# Patient Record
Sex: Female | Born: 1935 | Race: White | Hispanic: No | Marital: Married | Smoking: Never smoker
Health system: Southern US, Community
[De-identification: ages and names within clinical notes are randomized; demographics above are authoritative.]

## PROBLEM LIST (undated history)

## (undated) DIAGNOSIS — I10 Essential (primary) hypertension: Secondary | ICD-10-CM

## (undated) DIAGNOSIS — F419 Anxiety disorder, unspecified: Secondary | ICD-10-CM

## (undated) DIAGNOSIS — N390 Urinary tract infection, site not specified: Secondary | ICD-10-CM

## (undated) DIAGNOSIS — K219 Gastro-esophageal reflux disease without esophagitis: Secondary | ICD-10-CM

## (undated) DIAGNOSIS — R296 Repeated falls: Secondary | ICD-10-CM

## (undated) DIAGNOSIS — F0390 Unspecified dementia without behavioral disturbance: Secondary | ICD-10-CM

## (undated) DIAGNOSIS — M1990 Unspecified osteoarthritis, unspecified site: Secondary | ICD-10-CM

## (undated) DIAGNOSIS — E876 Hypokalemia: Secondary | ICD-10-CM

## (undated) DIAGNOSIS — I639 Cerebral infarction, unspecified: Secondary | ICD-10-CM

## (undated) DIAGNOSIS — M810 Age-related osteoporosis without current pathological fracture: Secondary | ICD-10-CM

## (undated) HISTORY — PX: EYE SURGERY: SHX253

## (undated) HISTORY — PX: JOINT REPLACEMENT: SHX530

---

## 1998-01-14 ENCOUNTER — Encounter: Admission: RE | Admit: 1998-01-14 | Discharge: 1998-04-14 | Payer: Self-pay | Admitting: Internal Medicine

## 1999-02-02 ENCOUNTER — Ambulatory Visit (HOSPITAL_COMMUNITY): Admission: RE | Admit: 1999-02-02 | Discharge: 1999-02-02 | Payer: Self-pay | Admitting: Cardiology

## 1999-02-02 ENCOUNTER — Encounter: Payer: Self-pay | Admitting: Cardiology

## 1999-02-03 ENCOUNTER — Encounter: Payer: Self-pay | Admitting: Orthopedic Surgery

## 1999-02-09 ENCOUNTER — Encounter: Payer: Self-pay | Admitting: Orthopedic Surgery

## 1999-02-09 ENCOUNTER — Inpatient Hospital Stay (HOSPITAL_COMMUNITY): Admission: RE | Admit: 1999-02-09 | Discharge: 1999-02-16 | Payer: Self-pay | Admitting: Orthopedic Surgery

## 1999-02-16 ENCOUNTER — Inpatient Hospital Stay (HOSPITAL_COMMUNITY)
Admission: RE | Admit: 1999-02-16 | Discharge: 1999-02-24 | Payer: Self-pay | Admitting: Physical Medicine and Rehabilitation

## 1999-02-23 ENCOUNTER — Encounter: Payer: Self-pay | Admitting: Physical Medicine and Rehabilitation

## 1999-02-24 ENCOUNTER — Inpatient Hospital Stay (HOSPITAL_COMMUNITY): Admission: AD | Admit: 1999-02-24 | Discharge: 1999-03-01 | Payer: Self-pay | Admitting: Orthopedic Surgery

## 1999-02-24 ENCOUNTER — Encounter: Payer: Self-pay | Admitting: Orthopedic Surgery

## 1999-03-01 ENCOUNTER — Inpatient Hospital Stay (HOSPITAL_COMMUNITY)
Admission: RE | Admit: 1999-03-01 | Discharge: 1999-03-09 | Payer: Self-pay | Admitting: Physical Medicine & Rehabilitation

## 1999-07-28 ENCOUNTER — Encounter: Payer: Self-pay | Admitting: Orthopedic Surgery

## 1999-07-28 ENCOUNTER — Inpatient Hospital Stay (HOSPITAL_COMMUNITY): Admission: EM | Admit: 1999-07-28 | Discharge: 1999-07-29 | Payer: Self-pay | Admitting: Emergency Medicine

## 1999-08-02 ENCOUNTER — Encounter: Payer: Self-pay | Admitting: Orthopedic Surgery

## 1999-08-04 ENCOUNTER — Inpatient Hospital Stay (HOSPITAL_COMMUNITY): Admission: RE | Admit: 1999-08-04 | Discharge: 1999-08-11 | Payer: Self-pay | Admitting: Orthopedic Surgery

## 1999-08-04 ENCOUNTER — Encounter: Payer: Self-pay | Admitting: Orthopedic Surgery

## 1999-12-15 ENCOUNTER — Other Ambulatory Visit: Admission: RE | Admit: 1999-12-15 | Discharge: 1999-12-15 | Payer: Self-pay | Admitting: Internal Medicine

## 2002-03-16 ENCOUNTER — Emergency Department (HOSPITAL_COMMUNITY): Admission: EM | Admit: 2002-03-16 | Discharge: 2002-03-16 | Payer: Self-pay | Admitting: Emergency Medicine

## 2002-03-16 ENCOUNTER — Encounter: Payer: Self-pay | Admitting: Internal Medicine

## 2002-04-08 ENCOUNTER — Ambulatory Visit (HOSPITAL_COMMUNITY): Admission: RE | Admit: 2002-04-08 | Discharge: 2002-04-08 | Payer: Self-pay | Admitting: Neurology

## 2002-04-08 ENCOUNTER — Encounter: Payer: Self-pay | Admitting: Neurology

## 2002-10-21 ENCOUNTER — Ambulatory Visit (HOSPITAL_COMMUNITY): Admission: RE | Admit: 2002-10-21 | Discharge: 2002-10-21 | Payer: Self-pay | Admitting: *Deleted

## 2002-11-08 ENCOUNTER — Encounter: Payer: Self-pay | Admitting: Neurology

## 2002-11-08 ENCOUNTER — Ambulatory Visit (HOSPITAL_COMMUNITY): Admission: RE | Admit: 2002-11-08 | Discharge: 2002-11-08 | Payer: Self-pay | Admitting: Neurology

## 2003-03-09 ENCOUNTER — Encounter: Payer: Self-pay | Admitting: Internal Medicine

## 2003-03-09 ENCOUNTER — Encounter: Admission: RE | Admit: 2003-03-09 | Discharge: 2003-03-09 | Payer: Self-pay | Admitting: Internal Medicine

## 2003-11-05 ENCOUNTER — Ambulatory Visit (HOSPITAL_COMMUNITY): Admission: RE | Admit: 2003-11-05 | Discharge: 2003-11-05 | Payer: Self-pay | Admitting: Neurology

## 2004-09-23 ENCOUNTER — Emergency Department (HOSPITAL_COMMUNITY): Admission: EM | Admit: 2004-09-23 | Discharge: 2004-09-23 | Payer: Self-pay | Admitting: Emergency Medicine

## 2006-05-19 ENCOUNTER — Encounter: Admission: RE | Admit: 2006-05-19 | Discharge: 2006-05-19 | Payer: Self-pay | Admitting: Psychiatry

## 2006-07-15 ENCOUNTER — Inpatient Hospital Stay (HOSPITAL_COMMUNITY): Admission: RE | Admit: 2006-07-15 | Discharge: 2006-07-19 | Payer: Self-pay | Admitting: Orthopedic Surgery

## 2006-08-12 ENCOUNTER — Observation Stay (HOSPITAL_COMMUNITY): Admission: EM | Admit: 2006-08-12 | Discharge: 2006-08-13 | Payer: Self-pay | Admitting: Emergency Medicine

## 2006-09-21 ENCOUNTER — Inpatient Hospital Stay (HOSPITAL_COMMUNITY): Admission: EM | Admit: 2006-09-21 | Discharge: 2006-09-22 | Payer: Self-pay | Admitting: Emergency Medicine

## 2006-11-13 ENCOUNTER — Emergency Department (HOSPITAL_COMMUNITY): Admission: EM | Admit: 2006-11-13 | Discharge: 2006-11-13 | Payer: Self-pay | Admitting: Emergency Medicine

## 2006-11-15 ENCOUNTER — Inpatient Hospital Stay (HOSPITAL_COMMUNITY): Admission: RE | Admit: 2006-11-15 | Discharge: 2006-11-19 | Payer: Self-pay | Admitting: Orthopedic Surgery

## 2007-02-15 ENCOUNTER — Other Ambulatory Visit: Admission: RE | Admit: 2007-02-15 | Discharge: 2007-02-15 | Payer: Self-pay | Admitting: Geriatric Medicine

## 2007-12-19 ENCOUNTER — Encounter: Admission: RE | Admit: 2007-12-19 | Discharge: 2007-12-19 | Payer: Self-pay | Admitting: Geriatric Medicine

## 2009-10-02 ENCOUNTER — Emergency Department (HOSPITAL_COMMUNITY): Admission: EM | Admit: 2009-10-02 | Discharge: 2009-10-02 | Payer: Self-pay | Admitting: Emergency Medicine

## 2010-05-04 ENCOUNTER — Inpatient Hospital Stay (HOSPITAL_COMMUNITY): Admission: EM | Admit: 2010-05-04 | Discharge: 2010-05-07 | Payer: Self-pay | Admitting: Emergency Medicine

## 2010-09-14 ENCOUNTER — Encounter: Admission: RE | Admit: 2010-09-14 | Discharge: 2010-09-14 | Payer: Self-pay | Admitting: Geriatric Medicine

## 2010-12-25 LAB — COMPREHENSIVE METABOLIC PANEL
Albumin: 2.6 g/dL — ABNORMAL LOW (ref 3.5–5.2)
BUN: 4 mg/dL — ABNORMAL LOW (ref 6–23)
Calcium: 8.3 mg/dL — ABNORMAL LOW (ref 8.4–10.5)
Creatinine, Ser: 0.46 mg/dL (ref 0.4–1.2)
Total Protein: 5.5 g/dL — ABNORMAL LOW (ref 6.0–8.3)

## 2010-12-25 LAB — TSH: TSH: 3.457 u[IU]/mL (ref 0.350–4.500)

## 2010-12-25 LAB — POCT I-STAT, CHEM 8
Creatinine, Ser: 0.8 mg/dL (ref 0.4–1.2)
Glucose, Bld: 89 mg/dL (ref 70–99)
Hemoglobin: 12.9 g/dL (ref 12.0–15.0)
Potassium: 2.7 mEq/L — CL (ref 3.5–5.1)

## 2010-12-25 LAB — URINALYSIS, ROUTINE W REFLEX MICROSCOPIC
Bilirubin Urine: NEGATIVE
Nitrite: POSITIVE — AB
Specific Gravity, Urine: 1.007 (ref 1.005–1.030)
Urobilinogen, UA: 0.2 mg/dL (ref 0.0–1.0)

## 2010-12-25 LAB — DIFFERENTIAL
Basophils Absolute: 0 10*3/uL (ref 0.0–0.1)
Lymphocytes Relative: 27 % (ref 12–46)
Monocytes Absolute: 0.6 10*3/uL (ref 0.1–1.0)
Monocytes Relative: 8 % (ref 3–12)
Neutro Abs: 4.4 10*3/uL (ref 1.7–7.7)
Neutrophils Relative %: 64 % (ref 43–77)

## 2010-12-25 LAB — PROTIME-INR
INR: 1.08 (ref 0.00–1.49)
Prothrombin Time: 13.9 seconds (ref 11.6–15.2)

## 2010-12-25 LAB — CBC
MCH: 32.8 pg (ref 26.0–34.0)
MCHC: 34.8 g/dL (ref 30.0–36.0)
MCV: 94 fL (ref 78.0–100.0)
Platelets: 244 10*3/uL (ref 150–400)
RDW: 13.4 % (ref 11.5–15.5)
WBC: 6.9 10*3/uL (ref 4.0–10.5)

## 2010-12-25 LAB — URINE CULTURE
Colony Count: >=100000
Culture  Setup Time: 201107270132

## 2010-12-25 LAB — PHOSPHORUS: Phosphorus: 2.6 mg/dL (ref 2.3–4.6)

## 2010-12-25 LAB — URINE MICROSCOPIC-ADD ON

## 2011-02-25 NOTE — H&P (Signed)
Loretta Garcia, Loretta Garcia                ACCOUNT NO.:  [id number]   MEDICAL RECORD NO.:  [id number]          PATIENT TYPE:  INP   LOCATION:  1421                         FACILITY:  [facility]   PHYSICIAN:  Ollen Gross, M.D.    DATE OF BIRTH:  07-Jun-1920   DATE OF ADMISSION:  07/15/2006  DATE OF DISCHARGE:                                HISTORY & PHYSICAL   DATE OF OFFICE VISIT AND HISTORY & PHYSICAL:  June 29, 2006.   CHIEF COMPLAINT:  Left hip pain.   HISTORY OF PRESENT ILLNESS:  This is an 77 year old female seen by Dr.  Lequita Halt for ongoing hip pain.  She has previously had a right hip revision  about 18 years ago.  The right hip has doing great.  She continues to have  pain on the left side.  She was found to have end-stage arthritis with bone-  on-bone throughout with some osteophyte formation.  It is felt she has  reached the point where she would benefit from undergoing surgery.  Risks  and benefits discussed.  Patient is scheduled to come to the hospital.   ALLERGIES:  CODEINE and DARVOCET which caused tongue swelling.   CURRENT MEDICATIONS:  Plavix, aspirin, Lozol, Micardis.   PAST MEDICAL HISTORY:  1. Stroke.  2. Hypertension.  3. Reflux disease.  4. History of thoracic compression fracture.   PAST SURGICAL HISTORY:  1. Hip surgeries.  2. Bunion surgery.  3. Cyst, right leg, surgery.  4. Detached retinal surgery.  5. Cataract surgery.   SOCIAL HISTORY:  Married, retired.  No smoking, no alcohol.  Two daughters.   FAMILY HISTORY:  Significant for hypertension, stroke, and heart disease.   REVIEW OF SYSTEMS:  GENERAL: No fever, chills, night sweats.  NEUROLOGIC:  History of stroke around 1999.  No seizure, syncope, paralysis.  RESPIRATORY: No shortness of breath, productive cough, or hemoptysis.  CARDIOVASCULAR: No chest pain, orthopnea.  GI: No nausea, vomiting,  diarrhea, constipation. GU: No dysuria, hematuria, discharge.  MUSCULOSKELETAL: Left hip.   PHYSICAL  EXAMINATION:  VITAL SIGNS:  Pulse 64, respiratory rate 12, blood  pressure 118/68.  GENERAL: An 77 year old female, well-nourished, well-developed, in no acute  distress, mildly anxious, average historian.  Alert, oriented, cooperative,  pleasant.  HEENT:  Normocephalic and atraumatic.  Pupils equal, round, and reactive.  Oropharynx clear.  EOMs intact.  NECK:  Supple.  CHEST:  Clear anterior and posterior chest wall.  HEART:  Regular rate and rhythm.  S1, S2 noted.  ABDOMEN:  Soft, nontender.  Bowel sounds present.  RECTAL/BREASTS/GENITALIA:  Not done, not pertinent to present illness.  EXTREMITIES:  Left hip flexion to 90 degrees.  Internal rotation 0, external  rotation 15, 20 degrees abduction.   IMPRESSION:  1. Osteoarthritis, left hip.  2. History of stroke.  3. Hypertension.  4. Reflux disease.  5. History of thoracic compression fracture.   PLAN:  The patient will be admitted to [facility] to undergo a  left total hip arthroplasty.  Surgery will be performed by Dr. Ollen Gross.      Loretta L.  Julien Garcia, P.A.      Ollen Gross, M.D.  Electronically Signed    ALP/MEDQ  D:  07/16/2006  T:  07/17/2006  Job:  [id number]   cc:   Soyla Murphy. Renne Crigler, M.D.  Fax: [phone]

## 2011-02-25 NOTE — Op Note (Signed)
Loretta Garcia, Loretta Garcia                ACCOUNT NO.:  [id number]   MEDICAL RECORD NO.:  [id number]          PATIENT TYPE:  INP   LOCATION:  1610                         [facility]ACILITY:  [facility]   PHYSICIAN:  Ollen Gross, M.D.    DATE OF BIRTH:  1920/07/06   DATE OF PROCEDURE:  11/16/2006  DATE OF DISCHARGE:                               OPERATIVE REPORT   PREOPERATIVE DIAGNOSIS:  Unstable left total hip arthroplasty.   POSTOPERATIVE DIAGNOSIS:  Unstable left total hip arthroplasty.   PROCEDURE:  Left hip revision to acetabular constrained liner.   SURGEON:  Dr. Lequita Halt   ASSISTANT:  Avel Peace, PA-C   ANESTHESIA:  General.   ESTIMATED BLOOD LOSS:  Less than 100.   DRAINS:  Hemovac x1.   COMPLICATIONS:  None.   CONDITION:  Stable to recovery.   BRIEF CLINICAL NOTE:  Loretta Garcia is an 77 year old female, had a left  total hip arthroplasty performed early October 2007.  Did very well  initially and then sustained an anterior dislocation.  She had 1 other  dislocation episode and has been in an abduction brace.  Unfortunately,  she has had episodes where she felt like the hip was subluxing.  She is  now at the point where she is extremely afraid of the hip.  We discussed  options and have decided upon revision surgery.   PROCEDURE IN DETAIL:  After the successful administration of general  anesthetic, the patient was placed in the right lateral decubitus  position with the left side up and held with the hip positioner.  Left  lower extremity is isolated from her perineum with plastic drapes and  prepped and draped in the usual sterile fashion.  Previous short  posterolateral incisions were utilized.  Skin cut with a 10 blade  through subcutaneous tissue to the level of the fascia lata which was  incised in line with the skin incision.  Sciatic nerve is palpated and  protected.  The posterior capsular structures were essentially stripped  from the femur already.  There was a small  amount of joint fluid  present, and that is removed.  No signs of any infection.  I placed  through a range of motion.  With full extension, full flexion rotation,  she was not dislocating anteriorly.  She did dislocate posteriorly when  I brought her up to flexion of 90, adduction of 30, and internal  rotation of about 70.  I dislocated and then removed the femoral head  which was a 32.0.  The hip is then translocated anteriorly to gain  acetabular exposure.  The acetabular component is in excellent position.  It matches her native anatomy.  I then examined femoral component, and  it also matched her native anatomy; thus we are not dealing with a  version problem on either component.  However, she did have quite lax  soft tissues.  We decided that the best means for fixing this problem  would be a constrained acetabular liner.  We then removed the liner from  the acetabular shell using the extraction device.  I then placed  the 52  mm constrained acetabular liner and impacted it in position.  We  replaced the 32.0 femoral head and placed the locking ring around the  neck.  The hip is reduced and than a locking ring placed around the  liner.  This is a very stable reduction.  She was not impinging  throughout any range of motion.  The wound is then copiously irrigated  with saline solution and the posterior pseudocapsule reattached to the  femur through drill holes.  Fascia lata was closed over Hemovac drain  with interrupted #1 Vicryl, subcu closed #1 and #2-0 Vicryl, and  subcuticular running 4-0 Monocryl.  The drain is hooked to suction,  incision cleaned and dried, and Steri-Strips and a bulky sterile  dressing applied.  She is then awakened and transported to recovery in  stable condition.      Ollen Gross, M.D.  Electronically Signed     FA/MEDQ  D:  11/16/2006  T:  11/16/2006  Job:  [id number]

## 2011-02-25 NOTE — Discharge Summary (Signed)
[name], KNUDSEN                ACCOUNT NO.:  [id number]   MEDICAL RECORD NO.:  [id number]          PATIENT TYPE:  INP   LOCATION:  1610                         FACILITY:  [facility]   PHYSICIAN:  Ollen Gross, M.D.    DATE OF BIRTH:  Nov 22, 1919   DATE OF ADMISSION:  11/15/2006  DATE OF DISCHARGE:  11/19/2006                               DISCHARGE SUMMARY   ADMISSION DIAGNOSES:  1. Recurrent dislocations, left total hip.  2. History of cerebrovascular accident.  3. Hypertension.  4. Reflux disease.  5. History of thoracic compression fracture.   DISCHARGE DIAGNOSES:  1. Unstable left total hip arthroplasty, status post left hip revision      to acetabular constrained liner.  2. Recurrent dislocations, left total hip.  3. History of cerebrovascular accident.  4. Hypertension.  5. Reflux disease.  6. History of thoracic compression fracture.  7. Postoperative hypokalemia.   PROCEDURE:  On November 16, 2006, left hip revision to acetabular  constrained liner.   SURGEON:  Ollen Gross, M.D.   ASSISTANT:  Alexzandrew L. Perkins, P.A.-C.   ANESTHESIA:  General.   CONSULTS:  None.   BRIEF HISTORY:  [name] is an 77 year old female who had left total  hip performed early in October, 2007.  Initially did well, then  sustained an anterior dislocation.  She had one other dislocation  episode and had been in an abduction brace, unfortunately.  She had  episodes where she felt like the hip was subluxing.  Now she is at the  point where she is extremely afraid.  Discussed options.  Elected to  proceed with revision procedure.   LABORATORY DATA:  Preop CBC:  Hemoglobin showed a level of 13.1,  hematocrit of 38.  White cell count 5.6.  Postop hemoglobin 12.3, PT/PTT  preop 13.5 and 27, respectively.  INR 1.  Pro time postop 14.1, INR 1.1.  Chem panel, low sodium 130, low potassium 3, low chloride 94, low BUN of  3, low albumin of 3.3.  The remaining chem panel within normal  limits.  Serial BMETs were followed.  Sodium did go up from 136, back down a  little bit at 132.  Potassium went up to 3.7, back down to 2.9.  Urinalysis: Large leukocyte esterase, 7-10 white cells, otherwise  negative.  Blood group type O+.   The EKG on November 15, 2006, normal sinus rhythm, normal EKG, when  compared to November 13, 2006.  No significant change was found,  confirmed by Dr. Charlton Haws.  The EKG on November 13, 2006, normal  sinus rhythm with premature atrial complexes, confirmed by Dr. Carleene Cooper.   HOSPITAL COURSE:  Patient was admitted to [facility] on  November 15, 2006, but unfortunately due to extenuating circumstances,  the operating room was not able to accommodate [place] until much later in  the evening, time unknown, with the planned elective surgery, discussed  in detail with Ms. Perfetti.  She elected, and both patient and doctor  agreed to postpone the surgery until the following day.  It was  rescheduled on the  following day of November 16, 2006.  She was preopped,  made n.p.o., taken to the operating room later that following afternoon,  and underwent the above-stated procedure without complication, tolerated  the procedure well, and later transferred back to the recovery room and  then back to the orthopedic floor.  She was seen on rounds on the  morning of day #1, actually doing pretty well.  Started getting up with  physical therapy.  Fluids were reduced.  She did have a drop in sodium  and potassium.  Fluids were changed.  Underwent oral potassium  replenishing by mouth.  Started getting up out of bed.  Allowed to be  weightbearing as tolerated.   By day #2, she was doing well.  No problems.  Starting to progress with  physical therapy.  She was getting up about 200 feet, then later 260  feet that afternoon.  Dressing was changed.  The incision looked good.  No signs of infection.  She did so well she was ready to go home by the  following  day.  Weaned over to p.o. meds.  Tolerated her Vicodin.  Was  discharged home.   DISCHARGE PLAN:  1. Patient was discharged home on November 19, 2006.  2. Discharge diagnoses:  Please see above.  3. Discharge meds:  Vicodin, Robaxin.  4. Diet:  Resume home diet.  5. Activity:  She can be weightbearing as tolerated.  Home health PT      and home health nursing.  Hip precautions.  Total hip protocol.  6. Followup:  In two weeks.   DISPOSITION:  Home.   CONDITION ON DISCHARGE:  Improved.      Alexzandrew L. Julien Girt, P.A.      Ollen Gross, M.D.  Electronically Signed    ALP/MEDQ  D:  12/21/2006  T:  12/22/2006  Job:  [id number]   cc:   Soyla Murphy. Renne Crigler, M.D.  Fax: [phone]   [facility]

## 2011-02-25 NOTE — Op Note (Signed)
Loretta Garcia, Loretta Garcia                ACCOUNT NO.:  [id number]   MEDICAL RECORD NO.:  [id number]          PATIENT TYPE:  INP   LOCATION:  5009                         FACILITY:  [facility]   PHYSICIAN:  Vania Rea. Supple, M.D.  DATE OF BIRTH:  02-Mar-1920   DATE OF PROCEDURE:  08/12/2006  DATE OF DISCHARGE:  08/13/2006                               OPERATIVE REPORT   PREOPERATIVE DIAGNOSIS:  Dislocated left total hip arthroplasty.   POSTOPERATIVE DIAGNOSIS:  Dislocated left total hip arthroplasty.   PROCEDURE:  Closed reduction of left total hip arthroplasty.   SURGEON:  Francena Hanly, MD   ANESTHESIA:  Masked, general.   HISTORY:  [name] is an 77 year old female who underwent a left hip  arthroplasty by Dr. Homero Fellers Aluisio approximately four weeks ago.  She was  doing very well until this evening at home.  She was standing at her  bathroom counter when she noted the abrupt onset of severe left hip pain  and the inability to bear weight.  She was brought by EMS to [facility]  Emergency room where under examination she was noted to have a  foreshortened and external rotated left lower extremity.  She was  grossly neurovascularly intact.  X-rays showed an anterior superior  dislocation of the left hip arthroplasty.  She was subsequently brought  to the operating room for a planned closed reduction.   Preoperatively, I counseled [name] on treatment options as well as  risks, benefits, or operative possible complications of paraprosthetic  fracture and/or dislocation and recurrent dislocation, possible need for  additional surgery reviewed.  She understands and accepts and agrees  with the planned procedure.   PROCEDURE IN DETAIL:  After undergoing routine proper evaluation, the  patient was brought to the operating room and on her hospital bed,  underwent smooth induction of a mask general anesthesia.  After  appropriate relaxation, a reduction maneuver was performed with a  combination of longitudinal traction, abduction, and internal rotation.  The femoral head was dislocated anteriorly and with some manipulation we  were able to gain a successful reduction, and then using live fluoro  confirmed that the femoral head was concentrically reduced within the  acetabulum.  Leg lengths were clinically equal.  At this point, a knee  immobilizer was then applied to the left lower extremity.  The patient  was then awakened and taken to the recovery room in stable condition.      Vania Rea. Supple, M.D.  Electronically Signed     KMS/MEDQ  D:  08/12/2006  T:  08/14/2006  Job:  [id number]

## 2011-02-25 NOTE — Op Note (Signed)
   NAME:  MARESSA, APOLLO                     ACCOUNT NO.:  [id number]   MEDICAL RECORD NO.:  [id number]                   PATIENT TYPE:  AMB   LOCATION:  ENDO                                 FACILITY:  [facility]   PHYSICIAN:  Georgiana Spinner, M.D.                 DATE OF BIRTH:  1920-09-25   DATE OF PROCEDURE:  10/21/2002  DATE OF DISCHARGE:                                 OPERATIVE REPORT   PROCEDURE:  Upper endoscopy.   INDICATIONS:  Dysphagia.   ANESTHESIA:  Demerol 60, Versed 3 mg.   DESCRIPTION OF PROCEDURE:  With patient mildly sedated in the left lateral  decubitus position, the Olympus videoscopic endoscope was inserted in the  mouth and passed under direct vision through the esophagus into the stomach.  Fundus, body, antrum, duodenal bulb, and second portion of duodenum were  visualized.  From this point, the endoscope was slowly withdrawn, taking  circumferential views of the duodenal mucosa until the endoscope then pulled  back into the stomach, placed in retroflexion to view the stomach from  below.  The endoscope was then straightened, and a guidewire was passed; the  endoscope was withdrawn, taking circumferential views of the remaining  gastric and esophageal mucosa.  Subsequently, over the guidewire was passed  15 and 17 dilators easily.  No blood seen on either dilator.  With the  latter, the guidewire was removed.  The endoscope was reinserted.  A small  amount of blood was seen in the fundus of the stomach, otherwise  unremarkable examination as the endoscope was withdrawn, taking  circumferential views once again of the remaining gastric and esophageal  mucosa.  The patient's vital signs and pulse oximeter remained stable.  The  patient tolerated the procedure well without apparent complications.   FINDINGS:  Unremarkable examination.  The patient dilated to 15 and 17  Savary dilators.   PLAN:  Liquids for today.  Advance to regular diet starting in the  morning.                                               Georgiana Spinner, M.D.    GMO/MEDQ  D:  10/21/2002  T:  10/21/2002  Job:  [id number]

## 2011-02-25 NOTE — H&P (Signed)
Loretta Garcia, Loretta Garcia NO.:  [id number]   MEDICAL RECORD NO.:  [id number]          PATIENT TYPE:  EMS   LOCATION:  ED                           FACILITY:  [facility]   PHYSICIAN:  Lianne Cure, P.A.  DATE OF BIRTH:  03/04/1920   DATE OF ADMISSION:  09/23/2004  DATE OF DISCHARGE:                                HISTORY & PHYSICAL   HISTORY OF PRESENT ILLNESS:  [name] is an 77 year old female who fell  on the ice today which is September 23, 2004 and had immediate right wrist  pain with obvious bony deformity.   PAST MEDICAL HISTORY:  CVA.   PAST SURGICAL HISTORY:  Multiple hip surgeries.   MEDICATIONS:  Plavix, Evista, Lozol, vitamin B12, aspirin 81 mg daily.   ALLERGIES:  CODEINE and SULFA drugs.   SOCIAL HISTORY:  She lives with her husband, she is a nonsmoker.   On admission, temperature 97.9, BP 124/74, respirations 20, pulse 72, 100%  saturation on room air.   REVIEW OF SYMPTOMS:  Shows no seizures, no headaches, no blurred vision, no  swelling difficulties, no bleeding tendencies. Abdomen no masses, no hernias  noted. No bowel or bladder incontinence.   PHYSICAL EXAMINATION:  GENERAL:  She is a well-developed, well-nourished, 24-  year-old, very pleasant female.  She weighs about 105 pounds. She is about 5  foot 2.  HEART:  Regular rate and rhythm.  LUNGS:  Clear to auscultation bilaterally.  HEENT:  Pupils equal round and reactive to light.  MUSCULOSKELETAL:  Her right wrist has obvious dorsal hematoma with obvious  bony deformity distally.  Radial, ulnar and median nerves are intact and  equal bilaterally.  Radial pulse is palpable, capillary refill is brisk.  Active range of motion of the fingers is intact.  Elbow nontender to  palpation.  Humerus nontender to palpation.  No other musculoskeletal  complaint.   X-RAYS:  Reviewed and show dorsally tilted distal right radius fracture.   PROCEDURE:  Under sterile conditions, we injected 10 mL  total of 1/2  Marcaine and 1/2 lidocaine for a hematoma block of the right distal radius.  The patient tolerated this well. We then proceeded to do closed reduction of  the right wrist.  Post reduction x-rays were reviewed and shows neutral  angle the distal radius with minimal to nondisplacement of the distal  portion of the right radius nonintraarticular fracture.  Post reduction she  is neurovascularly motor intact, equal bilateral upper extremities.   ASSESSMENT:  Status post closed right distal radius fracture.   PLAN:  She is going to followup with Dr. Amanda Pea in our office tomorrow  September 24, 2004 at 1:30.  I want her to elevate her right hand above the  level of her elbow, wiggle her fingers. She was given a prescription for  Vicodin 5/500 to take once q.6 p.r.n. for pain or she make Tylenol extra  strength p.r.n. for pain.  She also has a prescription for Tramadol at home  to take p.r.n. for pain.  We gave her a sling for comfort.  MC/MEDQ  D:  09/23/2004  T:  09/23/2004  Job:  [id number]

## 2011-02-25 NOTE — H&P (Signed)
Loretta Garcia, Loretta Garcia                ACCOUNT NO.:  [id number]   MEDICAL RECORD NO.:  [id number]          PATIENT TYPE:  INP   LOCATION:  NA                           FACILITY:  [facility]   PHYSICIAN:  Ollen Gross, M.D.    DATE OF BIRTH:  Apr 08, 1920   DATE OF ADMISSION:  11/15/2006  DATE OF DISCHARGE:                              HISTORY & PHYSICAL   Date of office visit, history and physical November 07, 2006.   CHIEF COMPLAINT:  Left hip dislocation.   HISTORY OF PRESENT ILLNESS:  The patient is an 77 year old female well  known to Dr. Ollen Gross having previously undergone a left total hip  back in October of 2007.  Unfortunately, she has had recurrent  dislocation requiring reductions.  She has been treated with a brace.  Unfortunately, continues to have instability and felt she would best be  served by undergoing revision and conversion over to a constrained  liner.  Risks and benefits discussed.  The patient was subsequently  admitted to the hospital.   ALLERGIES:  1. DARVOCET causes tongue swelling.  2. COUMADIN.   CURRENT MEDICATIONS:  Plavix, aspirin, Lozol, Micardis, multivitamin,  B12, calcium.   PAST MEDICAL HISTORY:  1. History of stroke.  2. Hypertension.  3. Reflux disease.  4. History of thoracic compression fracture.   PAST SURGICAL HISTORY:  1. Multiple hip surgeries.  2. Bunion surgery.  3. Cyst excision.  4. Detached retinal surgery.  5. Cataract removal.   SOCIAL HISTORY:  Married, retired.  Nonsmoker, no alcohol.  Two  children.   FAMILY HISTORY:  Significant for heart disease, hypertension and stroke.   REVIEW OF SYSTEMS:  GENERAL:  No fever, chills, night sweats.  NEURO:  History of stroke in 1999.  No seizures or syncope.  RESPIRATORY:  No  shortness of breath, productive cough or hemoptysis.  CARDIOVASCULAR:  No chest pain, angina, orthopnea.  GI:  No nausea, vomiting, diarrhea or  constipation.  GU:  No dysuria, hematuria or discharge.  MUSCULOSKELETAL:  Left hip.   PHYSICAL EXAMINATION:  VITAL SIGNS:  Pulse 70, respirations 12, blood  pressure 130/70.  GENERAL:  An 77 year old white female short in stature, no acute  distress.  She is alert, oriented and cooperative.  HEENT:  Normocephalic and atraumatic.  Pupils round and reactive.  Oropharynx is clear.  Neck is supple.  CHEST:  Clear.  HEART:  Regular rate and rhythm.  No murmur.  S1-S2 noted.  ABDOMEN:  Soft and nontender.  Bowel sounds present.  RECTAL/BREASTS/GENITALIA:  Not done, not pertinent to present illness.  EXTREMITIES:  Left lower extremity motor function intact.  She does wear  an abduction hip brace.  Sensation intact.   IMPRESSION:  1. Recurrent dislocation left total hip.  2. History of cerebrovascular accident.  3. Hypertension.  4. Reflux disease.  5. History of thoracic compression fracture.   PLAN:  The patient will be admitted to [facility] to undergo a  constrained liner to a left total hip.  Surgery will be performed by Dr.  Ollen Gross.  Loretta Garcia, P.A.      Ollen Gross, M.D.  Electronically Signed    ALP/MEDQ  D:  11/14/2006  T:  11/15/2006  Job:  [id number]   cc:   Ollen Gross, M.D.  Fax: [phone]   Soyla Murphy. Renne Crigler, M.D.  Fax: [phone]   [facility]

## 2011-02-25 NOTE — Op Note (Signed)
NAMEBERDINA, [name]                ACCOUNT NO.:  [id number]   MEDICAL RECORD NO.:  [id number]          PATIENT TYPE:  INP   LOCATION:  1421                         FACILITY:  [facility]   PHYSICIAN:  Ollen Gross, M.D.    DATE OF BIRTH:  May 02, 1920   DATE OF PROCEDURE:  DATE OF DISCHARGE:                                 OPERATIVE REPORT   PREOPERATIVE DIAGNOSIS:  Osteoarthritis, left hip.   POSTOPERATIVE DIAGNOSES:  Osteoarthritis, left hip.   PROCEDURE:  Left total hip arthroplasty.   SURGEON:  Ollen Gross, M.D.   Assitant: Patrica Duel, P.A>   ANESTHESIA:  General.   ESTIMATED BLOOD LOSS:  _200  Drain- Hemovac x1.   COMPLICATIONS:  None.   CONDITION:  Stable.   CLINICAL NOTE:  [name] is an 77 year old female with end-stage arthritis  of the left hip.  She had progressively worsening intractable pain.  She is  having difficulty ambulating.  She presents now for left total hip  arthroplasty.   PROCEDURE IN DETAIL:  At the successful administration of general  anesthetic, the patient's placed in the right lateral decubitus position  with the left side up and held with the hip positioner.  Left lower  extremity is isolated from her perineum with plastic drapes and prepped and  draped in the usual sterile fashion.  Short posterolateral incision is made  with a 10-blade through subcutaneous tissue to the level of fascia lata  which was incised in line with the skin incision.  Sciatic nerve is palpated  and protected, and a short external rotator is isolated off the femur.  Capsulectomy was performed, and the hip was dislocated.  Center of femoral  head is marked and a trial prosthesis placed such that the center of the  trial head corresponds to the center of the native femoral head.  Osteotomy  lines marked on the femoral neck and osteotomy made with an oscillating saw.  Femoral head is removed and the femur retracted anteriorly to gain  acetabular exposure.   Acetabular retractors were placed in labrum and osteophytes removed.  Reaming starts at 45 mm coursing increments of 2 up to 51 mm and then a 52-  mm pinnacle acetabular shell was placed in anatomic position and transfixed  with two dome screws.  Trial 32-mm neutral +4 liner was placed.  The femur  was then prepared with the canal finder and irrigation.  Axial reaming is  415.5 mm proximal reaming to 20-D, and the sleeve machine to a small. A 20-D  small trial sleeve is placed with 20 x 15 stem and a 36+ 8 neck about 10  degrees beyond her native anteversion.  The 32.0 head is placed.  The hip is  reduced with great stability, full extension, full external rotation 70  degrees flexion, 40 degrees adduction, 90 degrees internal rotation and 90  degrees of flexion and 70 degrees internal rotation.  By placing the left  leg on top of the right, she was just a couple millimeters short.  We went  to a +3 head which effectively corrected this.  All trials were then  removed, and the permanent apex hole eliminator was placed into the  acetabular shell.  The permanent 32-mm Neutra +4 Marathon liner was placed.  On the femoral side, we placed a 20-D small sleeve with a 20 x 15 stem and a  36+ 8 neck, again about 10 degrees beyond her native anteversion.  A 32 +3  head is placed, and the hips reduce at the same stability parameters.  Wound  was copiously irrigated with saline solution and the short rotators  reattached to the femur through a drill holes.  Fascia Lata was  closed over  Hemovac drain with interrupted #1 Vicryl, subcu closed with #1 and 2-0  Vicryl and subcuticular running 4-0 Monocryl.  Drains hooked to suction,  incision cleaned and dried and Steri-Strips and a bulky sterile dressing  applied.  She is placed into a knee immobilizer, awakened and transferred to  recovery in stable condition.      Ollen Gross, M.D.  Electronically Signed     FA/MEDQ  D:  07/15/2006  T:   07/16/2006  Job:  [id number]

## 2011-02-25 NOTE — Discharge Summary (Signed)
NAMEELVENA, Loretta Garcia                ACCOUNT NO.:  [id number]   MEDICAL RECORD NO.:  [id number]          PATIENT TYPE:  INP   LOCATION:  1421                         FACILITY:  [facility]   PHYSICIAN:  Ollen Gross, M.D.    DATE OF BIRTH:  [birth year]   DATE OF ADMISSION:  07/15/2006  DATE OF DISCHARGE:  07/19/2006                                 DISCHARGE SUMMARY   ADMITTING DIAGNOSES:  1. Osteoarthritis of the left hip.  2. History of stroke.  3. Hypertension.  4. Reflux disease.  5. History of thoracic compression fracture.   DISCHARGE DIAGNOSES:  1. Osteoarthritis of the left hip, status post left total hip      arthroplasty.  2. Postoperative blood loss anemia.  3. Status post transfusion without sequelae.  4. Postoperative hyponatremia.  5. Postoperative hypokalemia.  6. History of stroke.  7. Hypertension.  8. Reflux disease.  9. History of thoracic compression fracture.   PROCEDURE:  On July 19, 2006, left total hip.  Surgeon:  Ollen Gross,  M.D.  Assistant:  Alexzandrew L. Otho Darner.   CONSULTATIONS:  Cardiology, [facility].   BRIEF HISTORY:  Loretta Garcia is an 77 year old female with end stage  arthritis of the left hip, progressive worsening pain, having difficulty  ambulating now presents for a total hip.   LABORATORY DATA:  Pre-op CBC:  Hemoglobin 14.0, hematocrit 40.6.  Hemoglobin  postoperative down at 10.3, continued to get lower to 7.4 and 21.1, given  blood.  Post transfusion hemoglobin back up to 11.2 and hematocrit of 32.8.  PT PTT pre-op 13.1 and 27, respectively.  INR of 1.  Serial proTime  followed.  Last known PT INR of 15 and 1.2.  Did get super-therapeutic  postoperatively, shot up to 3.8 and then 6, given reversal agents, INR back  down to 1.2 before discharge.  Chem panel on admission low albumin of 3.3,  slightly elevated glucose of 130.  Serial B-METs are followed.  Sodium did  drop from 137 to 132, stabilized at 132.   Potassium came down from 4.8 to  2.9 back up to 3.7.  Cholesterol profile ordered, cholesterol showed 95,  triglycerides 60, HDL low of 36, VLDL at 12, LDL 47.  TSH level taken and  noted at 2.187.  Pre-op UA is negative.  Blood group type O positive.   Pre-op EKG, July 10, 2006, sinus rhythm with occasional possible poor R  wave potential V1 V2 when compared to July 28, 1999.  Followup EKG,  July 15, 2006, atrial fibrillation, no old tracing to compare per Dr.  Charlton Haws.  Followup EKG of July 16, 2006, normal sinus rhythm with a  sinus arrhythmia,  age undetermined, unconfirmed.   Chest x-ray, July 10, 2006, no acute cardiopulmonary findings.  Mild  changes of COPD.  Hip and pelvis done pre-op with postoperative change of  right hip, advanced arthritic degenerative changes left hip.   HOSPITAL COURSE:  The patient was admitted to [facility],  tolerated procedure well, latera transferred to the recovery room and found  to be  with a rapid rate.  EKG checked, found to be in atrial fibrillation  which was transient and cardiology was consulted.  She was placed on  Coumadin, felt to be transient, rate had come back down, started on Toprol  for beta-blocker, monitored closely and EKG on followup day, July 16, 2006, post-op day one back in a sinus rhythm, actually doing pretty well,  very comfortable on day one.  Hemovac drain placed at surgery was pulled.  She started getting up with physical therapy slowly.  By day two, her  hemoglobin was down to 7.4.  Potassium was down to 3.7.  She was given  supplements and also transfused blood.  She had initially been placed on  Lovenox but her INR shot up to 3.8, so that was discontinued.  She did get  up and walk about 8 feet and then 30 feet on day two.  By day three, INR  went higher to 6 requiring vitamin K treatment.  Dressing was changed and  incision looked good.  Progressing slowly with physical therapy,  ambulating  up to 35 to 40 feet.  She was still in sinus rhythm, pulse rate controlled  on the Toprol.  INR had come back down to a safe level of 1.2 by day four.  She was progressing with physical therapy.  Due to the significant level and  sensitivity to Coumadin, we decided to forego Coumadin therapy.  She was  placed back on her Plavix and aspirin.  She was progressing with her  therapy.  She was stable and ready to go home.   DISCHARGE PLAN:  1. The patient was discharged home on July 19, 2006.  2. Discharge diagnoses:  Please see above.  3. Discharge meds:  Given Robaxin.  The patient has Vicodin at home.      Resume her Plavix and aspirin.  4. Diet:  Resume previous home diet.  5. Followup two weeks from surgery with Dr. Lequita Halt.  Call the office for      an appointment.  6. Cardiology said they would follow up with her.  7. Activity:  Total hip protocol, partial weightbearing 25-50%, hip      precautions.  Home health PT and nursing.   DISPOSITION:  [place].   CONDITION ON DISCHARGE:  Improved.      Alexzandrew L. Julien Girt, P.A.      Ollen Gross, M.D.  Electronically Signed    ALP/MEDQ  D:  08/18/2006  T:  08/18/2006  Job:  [id number]   cc:   Soyla Murphy. Renne Crigler, M.D.  Fax: [phone]

## 2011-02-25 NOTE — Op Note (Signed)
NAMEARINE, FOLEY                ACCOUNT NO.:  [id number]   MEDICAL RECORD NO.:  [id number]          PATIENT TYPE:  INP   LOCATION:  5022                         FACILITY:  [facility]   PHYSICIAN:  Ollen Gross, M.D.    DATE OF BIRTH:  1920-04-25   DATE OF PROCEDURE:  09/21/2006  DATE OF DISCHARGE:  09/22/2006                               OPERATIVE REPORT   PREOPERATIVE DIAGNOSIS:  Dislocated left total hip arthroplasty.   POSTOPERATIVE DIAGNOSIS:  Dislocated left total hip arthroplasty.   PROCEDURE:  Closed reduction of left hip dislocation.   SURGEON:  Ollen Gross, M.D.   ASSISTANT:  None.   ANESTHESIA:  General.   COMPLICATIONS:  None.   CONDITION:  Stable to recovery.   BRIEF CLINICAL NOTE:  Loretta Garcia is an 77 year old female, who had a  left total hip arthroplasty performed in early October.  She had a  dislocation in early November and this represents her second  dislocation.  She is dislocated anteriorly.  She is neurovascularly  intact.  She presents now for closed reduction under general anesthesia.   PROCEDURE IN DETAIL:  After successful administration of a general  anesthetic, we placed longitudinal traction on the left lower extremity,  internally rotated and felt an audible reduction.  She had great  stability post reduction.  She had full extension and about 40 degrees  of external rotation, and was stable.  I then flexed her up to 90  degrees, rotated about 30 to 40 degrees in each direction and she was  stable.  I placed her into a knee immobilizer and we took an A/P pelvis  x-ray showing concentric reduction of the hip.  She subsequently  awakened and was transported to recovery in stable condition.      Ollen Gross, M.D.  Electronically Signed     FA/MEDQ  D:  09/21/2006  T:  09/22/2006  Job:  [id number]

## 2011-11-24 DIAGNOSIS — M549 Dorsalgia, unspecified: Secondary | ICD-10-CM | POA: Diagnosis not present

## 2011-11-24 DIAGNOSIS — I10 Essential (primary) hypertension: Secondary | ICD-10-CM | POA: Diagnosis not present

## 2011-11-24 DIAGNOSIS — L299 Pruritus, unspecified: Secondary | ICD-10-CM | POA: Diagnosis not present

## 2012-01-05 DIAGNOSIS — I10 Essential (primary) hypertension: Secondary | ICD-10-CM | POA: Diagnosis not present

## 2012-01-05 DIAGNOSIS — Z79899 Other long term (current) drug therapy: Secondary | ICD-10-CM | POA: Diagnosis not present

## 2012-01-05 DIAGNOSIS — R5381 Other malaise: Secondary | ICD-10-CM | POA: Diagnosis not present

## 2012-01-05 DIAGNOSIS — K921 Melena: Secondary | ICD-10-CM | POA: Diagnosis not present

## 2012-01-23 DIAGNOSIS — I6990 Unspecified sequelae of unspecified cerebrovascular disease: Secondary | ICD-10-CM | POA: Diagnosis not present

## 2012-01-23 DIAGNOSIS — I10 Essential (primary) hypertension: Secondary | ICD-10-CM | POA: Diagnosis not present

## 2012-03-14 DIAGNOSIS — H15849 Scleral ectasia, unspecified eye: Secondary | ICD-10-CM | POA: Diagnosis not present

## 2012-03-14 DIAGNOSIS — H33059 Total retinal detachment, unspecified eye: Secondary | ICD-10-CM | POA: Diagnosis not present

## 2012-03-29 DIAGNOSIS — H33009 Unspecified retinal detachment with retinal break, unspecified eye: Secondary | ICD-10-CM | POA: Diagnosis not present

## 2012-03-29 DIAGNOSIS — T1510XA Foreign body in conjunctival sac, unspecified eye, initial encounter: Secondary | ICD-10-CM | POA: Diagnosis not present

## 2012-04-19 DIAGNOSIS — Z1331 Encounter for screening for depression: Secondary | ICD-10-CM | POA: Diagnosis not present

## 2012-04-19 DIAGNOSIS — N390 Urinary tract infection, site not specified: Secondary | ICD-10-CM | POA: Diagnosis not present

## 2012-04-19 DIAGNOSIS — M549 Dorsalgia, unspecified: Secondary | ICD-10-CM | POA: Diagnosis not present

## 2012-04-19 DIAGNOSIS — Z0000 Encounter for general adult medical examination without abnormal findings: Secondary | ICD-10-CM | POA: Diagnosis not present

## 2012-04-19 DIAGNOSIS — Z79899 Other long term (current) drug therapy: Secondary | ICD-10-CM | POA: Diagnosis not present

## 2012-04-26 DIAGNOSIS — H15849 Scleral ectasia, unspecified eye: Secondary | ICD-10-CM | POA: Diagnosis not present

## 2012-06-12 DIAGNOSIS — H15849 Scleral ectasia, unspecified eye: Secondary | ICD-10-CM | POA: Diagnosis not present

## 2012-07-06 ENCOUNTER — Emergency Department (HOSPITAL_COMMUNITY): Payer: Medicare Other

## 2012-07-06 ENCOUNTER — Inpatient Hospital Stay (HOSPITAL_COMMUNITY)
Admission: EM | Admit: 2012-07-06 | Discharge: 2012-07-11 | DRG: 184 | Disposition: A | Discharge status: Skilled Nursing Facility | Payer: Medicare Other | Attending: Internal Medicine | Admitting: Internal Medicine

## 2012-07-06 ENCOUNTER — Encounter (HOSPITAL_COMMUNITY): Payer: Self-pay | Admitting: Emergency Medicine

## 2012-07-06 DIAGNOSIS — E876 Hypokalemia: Secondary | ICD-10-CM | POA: Diagnosis present

## 2012-07-06 DIAGNOSIS — N390 Urinary tract infection, site not specified: Secondary | ICD-10-CM | POA: Diagnosis present

## 2012-07-06 DIAGNOSIS — M542 Cervicalgia: Secondary | ICD-10-CM | POA: Diagnosis not present

## 2012-07-06 DIAGNOSIS — S2220XA Unspecified fracture of sternum, initial encounter for closed fracture: Principal | ICD-10-CM | POA: Diagnosis present

## 2012-07-06 DIAGNOSIS — R079 Chest pain, unspecified: Secondary | ICD-10-CM | POA: Diagnosis not present

## 2012-07-06 DIAGNOSIS — B961 Klebsiella pneumoniae [K. pneumoniae] as the cause of diseases classified elsewhere: Secondary | ICD-10-CM | POA: Diagnosis present

## 2012-07-06 DIAGNOSIS — Z79899 Other long term (current) drug therapy: Secondary | ICD-10-CM

## 2012-07-06 DIAGNOSIS — Y92009 Unspecified place in unspecified non-institutional (private) residence as the place of occurrence of the external cause: Secondary | ICD-10-CM

## 2012-07-06 DIAGNOSIS — W19XXXA Unspecified fall, initial encounter: Secondary | ICD-10-CM | POA: Diagnosis present

## 2012-07-06 DIAGNOSIS — K219 Gastro-esophageal reflux disease without esophagitis: Secondary | ICD-10-CM | POA: Diagnosis present

## 2012-07-06 DIAGNOSIS — M436 Torticollis: Secondary | ICD-10-CM | POA: Diagnosis present

## 2012-07-06 DIAGNOSIS — Z888 Allergy status to other drugs, medicaments and biological substances status: Secondary | ICD-10-CM | POA: Diagnosis not present

## 2012-07-06 DIAGNOSIS — Z7982 Long term (current) use of aspirin: Secondary | ICD-10-CM | POA: Diagnosis not present

## 2012-07-06 DIAGNOSIS — R6889 Other general symptoms and signs: Secondary | ICD-10-CM | POA: Diagnosis not present

## 2012-07-06 DIAGNOSIS — I10 Essential (primary) hypertension: Secondary | ICD-10-CM | POA: Diagnosis present

## 2012-07-06 DIAGNOSIS — E871 Hypo-osmolality and hyponatremia: Secondary | ICD-10-CM | POA: Diagnosis present

## 2012-07-06 DIAGNOSIS — Z885 Allergy status to narcotic agent status: Secondary | ICD-10-CM | POA: Diagnosis not present

## 2012-07-06 DIAGNOSIS — M810 Age-related osteoporosis without current pathological fracture: Secondary | ICD-10-CM | POA: Diagnosis present

## 2012-07-06 DIAGNOSIS — M47812 Spondylosis without myelopathy or radiculopathy, cervical region: Secondary | ICD-10-CM | POA: Diagnosis present

## 2012-07-06 DIAGNOSIS — M25559 Pain in unspecified hip: Secondary | ICD-10-CM | POA: Diagnosis not present

## 2012-07-06 DIAGNOSIS — T502X5A Adverse effect of carbonic-anhydrase inhibitors, benzothiadiazides and other diuretics, initial encounter: Secondary | ICD-10-CM | POA: Diagnosis present

## 2012-07-06 DIAGNOSIS — I119 Hypertensive heart disease without heart failure: Secondary | ICD-10-CM | POA: Diagnosis not present

## 2012-07-06 DIAGNOSIS — T148XXA Other injury of unspecified body region, initial encounter: Secondary | ICD-10-CM | POA: Diagnosis not present

## 2012-07-06 DIAGNOSIS — Z9181 History of falling: Secondary | ICD-10-CM | POA: Diagnosis not present

## 2012-07-06 DIAGNOSIS — M8448XD Pathological fracture, other site, subsequent encounter for fracture with routine healing: Secondary | ICD-10-CM | POA: Diagnosis not present

## 2012-07-06 DIAGNOSIS — S32509A Unspecified fracture of unspecified pubis, initial encounter for closed fracture: Secondary | ICD-10-CM | POA: Diagnosis not present

## 2012-07-06 DIAGNOSIS — Z5189 Encounter for other specified aftercare: Secondary | ICD-10-CM | POA: Diagnosis not present

## 2012-07-06 DIAGNOSIS — R0789 Other chest pain: Secondary | ICD-10-CM | POA: Diagnosis not present

## 2012-07-06 HISTORY — DX: Gastro-esophageal reflux disease without esophagitis: K21.9

## 2012-07-06 HISTORY — DX: Essential (primary) hypertension: I10

## 2012-07-06 HISTORY — DX: Age-related osteoporosis without current pathological fracture: M81.0

## 2012-07-06 LAB — BASIC METABOLIC PANEL
CO2: 30 mEq/L (ref 19–32)
Calcium: 10.2 mg/dL (ref 8.4–10.5)
Creatinine, Ser: 0.66 mg/dL (ref 0.50–1.10)
Glucose, Bld: 98 mg/dL (ref 70–99)

## 2012-07-06 LAB — CBC
MCHC: 34.9 g/dL (ref 30.0–36.0)
MCV: 90.8 fL (ref 78.0–100.0)
Platelets: 252 10*3/uL (ref 150–400)
RDW: 12.7 % (ref 11.5–15.5)
WBC: 8.6 10*3/uL (ref 4.0–10.5)

## 2012-07-06 MED ORDER — HYDROCODONE-ACETAMINOPHEN 5-325 MG PO TABS: 1.0000 | ORAL_TABLET | ORAL | Status: DC | PRN

## 2012-07-06 MED ORDER — MORPHINE SULFATE 4 MG/ML IJ SOLN
INTRAMUSCULAR | Status: AC
  Filled 2012-07-06: qty 1

## 2012-07-06 MED ORDER — MORPHINE SULFATE 4 MG/ML IJ SOLN
6.0000 mg | Freq: Once | INTRAMUSCULAR | Status: AC
  Administered 2012-07-06: 6 mg via INTRAVENOUS
  Filled 2012-07-06: qty 2

## 2012-07-06 MED ORDER — CIPROFLOXACIN HCL 500 MG PO TABS: 500.0000 mg | ORAL_TABLET | Freq: Two times a day (BID) | ORAL | Status: DC

## 2012-07-06 MED ORDER — MORPHINE SULFATE 4 MG/ML IJ SOLN
6.0000 mg | Freq: Once | INTRAMUSCULAR | Status: AC
  Administered 2012-07-06: 13:00:00 via INTRAVENOUS

## 2012-07-06 NOTE — ED Notes (Signed)
Pt reports that her chest hurts severely when repositioned or she attempts to sit up. VSS, pt in no apparent distress. Pt anxious and angry at times, supportive care and listening provided.

## 2012-07-06 NOTE — ED Notes (Signed)
Pt is resting comfortably. Going to hold pain medication for now.

## 2012-07-06 NOTE — ED Notes (Signed)
Took 325 of ASA at home

## 2012-07-06 NOTE — ED Notes (Signed)
Pt with hip-bed pan in place, daughter at bedside, to call when done

## 2012-07-06 NOTE — ED Notes (Signed)
Report called to floor RN

## 2012-07-06 NOTE — ED Notes (Signed)
Pt very anxious , reports severe pain in left hip and chest. Daughter in waiting room to come to room per NT.

## 2012-07-06 NOTE — ED Notes (Signed)
Tried to call report. RN unavailable. She will call me back.

## 2012-07-06 NOTE — ED Notes (Signed)
ZOX:[id number]<AV> Expected date:07/06/12<BR> Expected time:11:13 AM<BR> Means of arrival:Ambulance<BR> Comments:<BR> 78yo fall

## 2012-07-06 NOTE — ED Notes (Signed)
Bedside report received from previous RN 

## 2012-07-06 NOTE — ED Notes (Signed)
Pt o2 sat dropped to 52 with good waveform on RA.  Pt alert oriented.  Graf 2L applied and oxygen saturation went back up to 93%.

## 2012-07-06 NOTE — ED Provider Notes (Addendum)
History     CSN: [id number]  Arrival date & time 07/06/12  1127   First MD Initiated Contact with Patient 07/06/12 1129      Chief Complaint  Patient presents with  . Fall  . Hip Pain  . Chest Pain     HPI The patient reports she attempted to sit today missed the chair and fell onto her bottom on the left side of her hip.  She reports some pain in her left hip and leg.  She has a previous hip replacement here.  She also reports feeling a cracking sensation in her anterior chest reports severe chest pain with palpation.  She has no shortness pressures or diaphoresis.  Her pain is moderate to severe at this time.  Up until this mechanical fall today she was without any new acute symptoms.  She lives at the [place] home.  She has no dysuria or urinary frequency.  She denies abdominal pain.  She has no nausea vomiting or diarrhea.  She's had no fevers chills.  Family reports is at baseline mental status.  She did not strike her head.  She's not on anticoagulants.  She has no neck pain.  She denies weakness of her upper lower extremities   Past Medical History  Diagnosis Date  . Hypertension   . Osteoporosis   . GERD (gastroesophageal reflux disease)     Past Surgical History  Procedure Date  . Joint replacement     No family history on file.  History  Substance Use Topics  . Smoking status: Never Smoker   . Smokeless tobacco: Not on file  . Alcohol Use: No    OB History    Grav Para Term Preterm Abortions TAB SAB Ect Mult Living                  Review of Systems  All other systems reviewed and are negative.    Allergies  Codeine; Darvocet; and Plavix  Home Medications   Current Outpatient Rx  Name Route Sig Dispense Refill  . ASPIRIN EC 81 MG PO TBEC Oral Take 81 mg by mouth daily.    Marland Kitchen CALCIUM CARBONATE 1250 MG PO TABS Oral Take 1 tablet by mouth daily.    . INDAPAMIDE 1.25 MG PO TABS Oral Take 1.25 mg by mouth daily.    . ADULT MULTIVITAMIN W/MINERALS CH  Oral Take 1 tablet by mouth daily.    Marland Kitchen VITAMIN C 500 MG PO TABS Oral Take 500 mg by mouth daily.    Marland Kitchen CIPROFLOXACIN HCL 500 MG PO TABS Oral Take 1 tablet (500 mg total) by mouth every 12 (twelve) hours. 10 tablet 0  . HYDROCODONE-ACETAMINOPHEN 5-325 MG PO TABS Oral Take 1 tablet by mouth every 4 (four) hours as needed for pain. 20 tablet 0    BP 138/56  Pulse 69  Temp 98.3 F (36.8 C) (Oral)  Resp 21  Ht 5\' 3"  (1.6 m)  Wt 100 lb (45.36 kg)  BMI 17.71 kg/m2  SpO2 95%  Physical Exam  Nursing note and vitals reviewed. Constitutional: She is oriented to person, place, and time. She appears well-developed and well-nourished. No distress.  HENT:  Head: Normocephalic and atraumatic.  Eyes: EOM are normal.  Neck: Normal range of motion.  Cardiovascular: Normal rate, regular rhythm and normal heart sounds.   Pulmonary/Chest: Effort normal and breath sounds normal.  Abdominal: Soft. She exhibits no distension. There is no tenderness.  Musculoskeletal:  Full range of motion of bilateral ankles hips and knees.  Neurological: She is alert and oriented to person, place, and time.  Skin: Skin is warm and dry.  Psychiatric: She has a normal mood and affect. Judgment normal.    ED Course  Procedures (including critical care time)   Date: 07/06/2012  Rate: 74  Rhythm: normal sinus rhythm  QRS Axis: normal  Intervals: normal  ST/T Wave abnormalities: normal  Conduction Disutrbances: none  Narrative Interpretation:   Old EKG Reviewed: No significant changes noted     Labs Reviewed  BASIC METABOLIC PANEL - Abnormal; Notable for the following:    Sodium 134 (*)     Potassium 3.1 (*)     Chloride 90 (*)     GFR calc non Af Amer 75 (*)     GFR calc Af Amer 87 (*)     All other components within normal limits  CBC  TROPONIN I   Dg Chest 2 View  07/06/2012  *RADIOLOGY REPORT*  Clinical Data: Fall with severe chest pain.  CHEST - 2 VIEW  Comparison: 12/19/2007.  Findings:  Trachea is midline.  Heart size normal.  Thoracic aorta is calcified.  Biapical pleural thickening, left greater than right.  Question mild subpleural scarring at the right costophrenic angle.  No definite pleural fluid.  Osteopenia makes assessment of the lower thoracic spine difficult on the lateral view.  IMPRESSION: No acute findings.   Original Report Authenticated By: Reyes Ivan, M.D.    Dg Hip Complete Left  07/06/2012  *RADIOLOGY REPORT*  Clinical Data: Left hip pain status post fall.  LEFT HIP - COMPLETE 2+ VIEW  Comparison: 05/03/2010 radiograph  Findings: Diffuse osteopenia.  Bilateral total hip arthroplasties. No dislocation.  No periprosthetic lucency. Inferior pubic ramus fracture bilaterally with callus formation favoring a prior injury. Atherosclerotic vascular calcifications. Sacrum is obscured by overlying bowel.  Lower lumbar degenerative changes.  IMPRESSION:  Bilateral inferior pubic rami fractures with callus formation favoring a prior injury.   Original Report Authenticated By: Waneta Martins, M.D.     I personally reviewed the imaging tests through PACS system  I reviewed available ER/hospitalization records thought the EMR   1. Hip pain   2. Chest pain       MDM  Patient lives in the [place] home.  She reports pain in her bilateral hips however am able to fully range these.  X-rays without acute fracture.  She also reports midsternal pain is worse with palpation.  Her chest x-ray demonstrates no abnormality.  Discharge home in good condition.  PCP followup        Lyanne Co, MD 07/06/12 1652  5:22 PM Pt continues to have chest pain. Will tx pain. Will obtain CT scan to evaluate for nondisplaced sternal fracture. Care to Dr Rulon Abide. Spoke with daughter Ms Jennings Books [phone])  Lyanne Co, MD 07/06/12 1722  Lyanne Co, MD 07/06/12 [phone]

## 2012-07-06 NOTE — ED Notes (Addendum)
Pt sleeping, morphine held for now.

## 2012-07-06 NOTE — ED Notes (Signed)
Per pt: while attempting to sit, patient missed chair and fell into seated position. Pt has left hip and leg pain (previous hip replacement and revision bilaterally per pt) as well as midsternal pain that is worse with palpation. No shortness of breath. Lives at [facility].

## 2012-07-06 NOTE — ED Notes (Signed)
PT requiring coaching to breath deep due to chest discomfort with palpation or deep breath or sitting up; O2 2L applied.

## 2012-07-07 ENCOUNTER — Encounter (HOSPITAL_COMMUNITY): Payer: Self-pay | Admitting: Oncology

## 2012-07-07 DIAGNOSIS — S2220XA Unspecified fracture of sternum, initial encounter for closed fracture: Principal | ICD-10-CM | POA: Diagnosis present

## 2012-07-07 DIAGNOSIS — M25559 Pain in unspecified hip: Secondary | ICD-10-CM

## 2012-07-07 DIAGNOSIS — R079 Chest pain, unspecified: Secondary | ICD-10-CM

## 2012-07-07 DIAGNOSIS — Y92009 Unspecified place in unspecified non-institutional (private) residence as the place of occurrence of the external cause: Secondary | ICD-10-CM | POA: Diagnosis not present

## 2012-07-07 DIAGNOSIS — W19XXXA Unspecified fall, initial encounter: Secondary | ICD-10-CM

## 2012-07-07 DIAGNOSIS — E876 Hypokalemia: Secondary | ICD-10-CM | POA: Diagnosis present

## 2012-07-07 LAB — TSH: TSH: 2.184 u[IU]/mL (ref 0.350–4.500)

## 2012-07-07 LAB — URINALYSIS, ROUTINE W REFLEX MICROSCOPIC
Bilirubin Urine: NEGATIVE
Nitrite: POSITIVE — AB
Protein, ur: NEGATIVE mg/dL
Specific Gravity, Urine: 1.015 (ref 1.005–1.030)
Urobilinogen, UA: 0.2 mg/dL (ref 0.0–1.0)

## 2012-07-07 LAB — URINE MICROSCOPIC-ADD ON

## 2012-07-07 MED ORDER — ENOXAPARIN SODIUM 30 MG/0.3ML ~~LOC~~ SOLN
30.0000 mg | SUBCUTANEOUS | Status: DC
  Administered 2012-07-07 – 2012-07-11 (×5): 30 mg via SUBCUTANEOUS
  Filled 2012-07-07 (×5): qty 0.3

## 2012-07-07 MED ORDER — POTASSIUM CHLORIDE CRYS ER 20 MEQ PO TBCR
40.0000 meq | EXTENDED_RELEASE_TABLET | Freq: Two times a day (BID) | ORAL | Status: AC
  Administered 2012-07-07: 40 meq via ORAL
  Filled 2012-07-07 (×2): qty 2

## 2012-07-07 MED ORDER — ONDANSETRON HCL 4 MG/2ML IJ SOLN: 4.0000 mg | Freq: Four times a day (QID) | INTRAMUSCULAR | Status: DC | PRN

## 2012-07-07 MED ORDER — INDAPAMIDE 1.25 MG PO TABS
1.2500 mg | ORAL_TABLET | Freq: Every day | ORAL | Status: DC
  Administered 2012-07-07 – 2012-07-11 (×5): 1.25 mg via ORAL
  Filled 2012-07-07 (×5): qty 1

## 2012-07-07 MED ORDER — ACETAMINOPHEN 325 MG PO TABS: 650.0000 mg | ORAL_TABLET | Freq: Four times a day (QID) | ORAL | Status: DC | PRN

## 2012-07-07 MED ORDER — INFLUENZA VIRUS VACC SPLIT PF IM SUSP
0.5000 mL | INTRAMUSCULAR | Status: AC
  Filled 2012-07-07: qty 0.5

## 2012-07-07 MED ORDER — ENOXAPARIN SODIUM 40 MG/0.4ML ~~LOC~~ SOLN: 40.0000 mg | SUBCUTANEOUS | Status: DC

## 2012-07-07 MED ORDER — ONDANSETRON HCL 4 MG/2ML IJ SOLN: 4.0000 mg | Freq: Three times a day (TID) | INTRAMUSCULAR | Status: DC | PRN

## 2012-07-07 MED ORDER — ACETAMINOPHEN 650 MG RE SUPP: 650.0000 mg | Freq: Four times a day (QID) | RECTAL | Status: DC | PRN

## 2012-07-07 MED ORDER — HYDROMORPHONE HCL PF 1 MG/ML IJ SOLN: 0.5000 mg | INTRAMUSCULAR | Status: DC | PRN

## 2012-07-07 MED ORDER — CALCIUM CARBONATE 1250 (500 CA) MG PO TABS
1.0000 | ORAL_TABLET | Freq: Every day | ORAL | Status: DC
  Administered 2012-07-07 – 2012-07-11 (×5): 500 mg via ORAL
  Filled 2012-07-07 (×5): qty 1

## 2012-07-07 MED ORDER — METHOCARBAMOL 500 MG PO TABS
500.0000 mg | ORAL_TABLET | Freq: Four times a day (QID) | ORAL | Status: DC | PRN
  Administered 2012-07-10 – 2012-07-11 (×2): 500 mg via ORAL
  Filled 2012-07-07 (×2): qty 1

## 2012-07-07 MED ORDER — ASPIRIN EC 81 MG PO TBEC
81.0000 mg | DELAYED_RELEASE_TABLET | Freq: Every day | ORAL | Status: DC
Start: 2012-07-07 — End: 2012-07-11
  Administered 2012-07-07 – 2012-07-11 (×5): 81 mg via ORAL
  Filled 2012-07-07 (×5): qty 1

## 2012-07-07 MED ORDER — SODIUM CHLORIDE 0.9 % IV SOLN
INTRAVENOUS | Status: DC
  Administered 2012-07-07 – 2012-07-08 (×3): via INTRAVENOUS

## 2012-07-07 MED ORDER — ONDANSETRON HCL 4 MG PO TABS: 4.0000 mg | ORAL_TABLET | Freq: Four times a day (QID) | ORAL | Status: DC | PRN

## 2012-07-07 MED ORDER — VITAMIN C 500 MG PO TABS
500.0000 mg | ORAL_TABLET | Freq: Every day | ORAL | Status: DC
  Administered 2012-07-07 – 2012-07-11 (×5): 500 mg via ORAL
  Filled 2012-07-07 (×6): qty 1

## 2012-07-07 MED ORDER — TRAMADOL HCL 50 MG PO TABS: 50.0000 mg | ORAL_TABLET | Freq: Four times a day (QID) | ORAL | Status: DC | PRN

## 2012-07-07 MED ORDER — HYDROMORPHONE HCL PF 1 MG/ML IJ SOLN
0.2500 mg | INTRAMUSCULAR | Status: DC | PRN
  Administered 2012-07-07 – 2012-07-09 (×8): 0.25 mg via INTRAVENOUS
  Filled 2012-07-07 (×8): qty 1

## 2012-07-07 NOTE — Plan of Care (Signed)
Problem: Phase I Progression Outcomes Goal: OOB as tolerated unless otherwise ordered Outcome: Not Progressing Pain is too severe for pt to get oob independently.

## 2012-07-07 NOTE — Progress Notes (Signed)
Patient admitted after midnight by my colleague Dr. Blake Divine, referred to H&P prepared by her for admission details. Essentially admitted after mechanical fall at home and found with sternum fx and old compression fracture. Also with dehydration with hyponatremia and hypokalemia. Will continue IVF's, replete and track electrolytes trend. Follow Pt/OT eval and rec's. Patient might need higher level of assistance at discharge [facility] instead of ALF).  Jecenia Leamer [phone]

## 2012-07-07 NOTE — Progress Notes (Signed)
SLP Cancellation Note  Treatment cancelled today due to order cancelled by MD.  Per SLP discussion with RN, pt tolerating po medications without problems.    Donavan Burnet, MS [facility] SLP [phone]

## 2012-07-07 NOTE — H&P (Signed)
[facility] History and Physical  Loretta Garcia JXB:[id number] DOB: January 31, 1920 DOA: 07/06/2012   PCP: [name], MD   Chief Complaint: fell at home and chest tenderness  HPI: Loretta Garcia is a 78 y.o. female with prior h/o of Hypertension lives at [facility] home, was brought to ED after a fall at home. As per the patient she was trying to sit, but fell to the ground, denies hitting her head, denies any dizziness or syncope. She reports since the fall her chest hurts and pointed towards the epigastric area. On arrival to ED, she had a CT chest without contrast showing a non displaced fracture involving the inferior aspect of the sternum. She was also found to have old fractures of the thoracic vertebrae and pubic ramus fractures.she lives by herself and was admitted to hospitalist service for observation for pain control.    Review of Systems:  Limited. Please see HPI, otherwise negative.  Past Medical History  Diagnosis Date  . Hypertension   . Osteoporosis   . GERD (gastroesophageal reflux disease)    Past Surgical History  Procedure Date  . Joint replacement    Social History:  reports that she has never smoked. She does not have any smokeless tobacco history on file. She reports that she does not drink alcohol. Her drug history not on file.  [facility] does [facility] Allergies  Allergen Reactions  . Codeine   . Darvocet (Propoxyphene-Acetaminophen)   . Plavix (Clopidogrel Bisulfate) Other (See Comments)    unsure    No family history on file.   Prior to Admission medications   Medication Sig Start Date End Date Taking? Authorizing Provider  aspirin EC 81 MG tablet Take 81 mg by mouth daily.   Yes Historical Provider, MD  calcium carbonate (OS-CAL - DOSED IN MG OF ELEMENTAL CALCIUM) 1250 MG tablet Take 1 tablet by mouth daily.   Yes Historical Provider, MD  indapamide (LOZOL) 1.25 MG tablet Take 1.25 mg by mouth daily.   Yes Historical Provider,  MD  Multiple Vitamin (MULTIVITAMIN WITH MINERALS) TABS Take 1 tablet by mouth daily.   Yes Historical Provider, MD  vitamin C (ASCORBIC ACID) 500 MG tablet Take 500 mg by mouth daily.   Yes Historical Provider, MD  ciprofloxacin (CIPRO) 500 MG tablet Take 1 tablet (500 mg total) by mouth every 12 (twelve) hours. 07/06/12   Lyanne Co, MD  HYDROcodone-acetaminophen (NORCO/VICODIN) 5-325 MG per tablet Take 1 tablet by mouth every 4 (four) hours as needed for pain. 07/06/12   Lyanne Co, MD   Physical Exam: Filed Vitals:   07/06/12 2000 07/06/12 2100 07/06/12 2200 07/06/12 2300  BP: 138/65 150/60 184/75 116/46  Pulse: 62 60 79 79  Temp:      TempSrc:      Resp: 17 14 15 17   Height:      Weight:      SpO2: 100% 100% 100% 100%    Constitutional: Vital signs reviewed.  Patient is a poorly nourished  in no acute distress and cooperative with exam. Alert and oriented place and person.  Head: Normocephalic and atraumatic Mouth: no erythema or exudates, MMM Eyes: PERRL, EOMI, conjunctivae normal, No scleral icterus.  Neck: Supple, Trachea midline normal ROM, No JVD, mass, thyromegaly, or carotid bruit present.  Cardiovascular: RRR, S1 normal, S2 normal, no MRG, pulses symmetric and intact bilaterally Pulmonary/Chest: CTAB, no wheezes, rales, or rhonchi, sternal tenderness inferiorly. Abdominal: Soft. Non-tender, non-distended, bowel sounds are normal, no  masses, organomegaly, or guarding present.  Musculoskeletal: No joint deformities, erythema, or stiffness, ROM full and no nontender Hematology: no cervical, inginal, or axillary adenopathy.  Neurological: A&O to person and place. Able to move all extremities .  Skin: Warm, dry and intact. No rash, cyanosis, or clubbing.  Psychiatric: Normal mood and affect. speech and behavior is normal.  Labs on Admission:  Basic Metabolic Panel:  Lab 07/06/12 1610  NA 134*  K 3.1*  CL 90*  CO2 30  GLUCOSE 98  BUN 16  CREATININE 0.66  CALCIUM  10.2  MG --  PHOS --   Liver Function Tests: No results found for this basename: AST:5,ALT:5,ALKPHOS:5,BILITOT:5,PROT:5,ALBUMIN:5 in the last 168 hours No results found for this basename: LIPASE:5,AMYLASE:5 in the last 168 hours No results found for this basename: AMMONIA:5 in the last 168 hours CBC:  Lab 07/06/12 1210  WBC 8.6  NEUTROABS --  HGB 13.1  HCT 37.5  MCV 90.8  PLT 252   Cardiac Enzymes:  Lab 07/06/12 1210  CKTOTAL --  CKMB --  CKMBINDEX --  TROPONINI <0.30    BNP (last 3 results) No results found for this basename: PROBNP:3 in the last 8760 hours CBG: No results found for this basename: GLUCAP:5 in the last 168 hours  Radiological Exams on Admission: Dg Chest 2 View  07/06/2012  *RADIOLOGY REPORT*  Clinical Data: Fall with severe chest pain.  CHEST - 2 VIEW  Comparison: 12/19/2007.  Findings: Trachea is midline.  Heart size normal.  Thoracic aorta is calcified.  Biapical pleural thickening, left greater than right.  Question mild subpleural scarring at the right costophrenic angle.  No definite pleural fluid.  Osteopenia makes assessment of the lower thoracic spine difficult on the lateral view.  IMPRESSION: No acute findings.   Original Report Authenticated By: Reyes Ivan, M.D.    Dg Hip Complete Left  07/06/2012  *RADIOLOGY REPORT*  Clinical Data: Left hip pain status post fall.  LEFT HIP - COMPLETE 2+ VIEW  Comparison: 05/03/2010 radiograph  Findings: Diffuse osteopenia.  Bilateral total hip arthroplasties. No dislocation.  No periprosthetic lucency. Inferior pubic ramus fracture bilaterally with callus formation favoring a prior injury. Atherosclerotic vascular calcifications. Sacrum is obscured by overlying bowel.  Lower lumbar degenerative changes.  IMPRESSION:  Bilateral inferior pubic rami fractures with callus formation favoring a prior injury.   Original Report Authenticated By: Waneta Martins, M.D.    Ct Chest Wo Contrast  07/06/2012   *RADIOLOGY REPORT*  Clinical Data: Cough.  Evaluate for sternal fracture.  CT CHEST WITHOUT CONTRAST  Technique:  Multidetector CT imaging of the chest was performed following the standard protocol without IV contrast.  Comparison: Chest radiograph 07/06/2012.  Findings: No pathologically enlarged mediastinal or axillary lymph nodes.  Hilar regions are difficult to definitively evaluate without IV contrast.  Ascending aorta measures 4.1 cm.  Heart size normal.  Scattered coronary artery calcification.  No pericardial effusion.  Biapical pleural parenchymal scarring.  A 7 mm (6 x 8 mm) nodule is seen in the right lower lobe (image 43).  Scattered scarring and/or atelectasis in the lower lobes. Additional scattered smaller nodules are seen predominantly in the left upper lobe.  Focal mucoid impaction in the right upper lobe (image 14).  No pleural fluid.  Airway is unremarkable.  Incidental imaging of the upper abdomen shows no acute findings. No worrisome lytic or sclerotic lesions.  Degenerative changes are seen in the spine.  Several cortical irregularity is seen along the posterior  margin of the inferior sternum.  Lower thoracic compression fracture is age indeterminate.  IMPRESSION:  1.  Nondisplaced fracture involving the inferior aspect of the sternum. 2.  Lower thoracic compression fracture is age indeterminate. 3.  Scattered nodular densities in the lungs. If the patient is at high risk for bronchogenic carcinoma, follow-up chest CT at 3-6 months is recommended.  If the patient is at low risk for bronchogenic carcinoma, follow-up chest CT at 6-12 months is recommended.  This recommendation follows the consensus statement: Guidelines for Management of Small Pulmonary Nodules Detected on CT Scans: A Statement from the Fleischner Society as published in Radiology 2005; 237:395-400. 4.  Ascending aortic aneurysm.   Original Report Authenticated By: Reyes Ivan, M.D.     EKG:  sinus  Assessment/Plan Active Problems:  Fall at home  Fracture, sternum closed  Hypokalemia   1. Mechanical fall at home resulting in a sternal fracture: pain control, iv fluids, and ambulation as tolerated.  - PT/OT evaluation in am.  -fall precautions.  2. Hypokalemia: replete as needed. Repeat labs in am.  3. Hypertension; controlled.  4. Old thoracic compression fractures: pain control. 5. Mild hyponatremia; probably from dehydration. 6. DVT prophylaxis  Please call family and  address  advanced directives/ Code status in am   [facility] Pager [phone]  If 7PM-7AM, please contact night-coverage [email or web address] Password [facility] 07/07/2012, 12:10 AM

## 2012-07-07 NOTE — Progress Notes (Signed)
Rx Brief Lovenox note  Wt= 45 kg, CrCl~33  Rx adjusted dose to 30mg  daily for DVT prophylaxis secondary to wt = 45 kg  Loretta Garcia 07/07/2012 12:49 AM

## 2012-07-08 DIAGNOSIS — Y92009 Unspecified place in unspecified non-institutional (private) residence as the place of occurrence of the external cause: Secondary | ICD-10-CM | POA: Diagnosis not present

## 2012-07-08 DIAGNOSIS — R079 Chest pain, unspecified: Secondary | ICD-10-CM | POA: Diagnosis not present

## 2012-07-08 DIAGNOSIS — N390 Urinary tract infection, site not specified: Secondary | ICD-10-CM

## 2012-07-08 DIAGNOSIS — S2220XA Unspecified fracture of sternum, initial encounter for closed fracture: Secondary | ICD-10-CM | POA: Diagnosis not present

## 2012-07-08 DIAGNOSIS — W19XXXA Unspecified fall, initial encounter: Secondary | ICD-10-CM | POA: Diagnosis not present

## 2012-07-08 LAB — BASIC METABOLIC PANEL
BUN: 11 mg/dL (ref 6–23)
CO2: 29 mEq/L (ref 19–32)
GFR calc non Af Amer: 79 mL/min — ABNORMAL LOW (ref >90)
Glucose, Bld: 100 mg/dL — ABNORMAL HIGH (ref 70–99)
Potassium: 2.5 mEq/L — CL (ref 3.5–5.1)

## 2012-07-08 LAB — MRSA PCR SCREENING: MRSA by PCR: NEGATIVE

## 2012-07-08 MED ORDER — DEXTROSE 5 % IV SOLN
1.0000 g | INTRAVENOUS | Status: DC
  Administered 2012-07-09 – 2012-07-10 (×2): 1 g via INTRAVENOUS
  Filled 2012-07-08 (×3): qty 10

## 2012-07-08 MED ORDER — POTASSIUM CHLORIDE 10 MEQ/100ML IV SOLN
10.0000 meq | INTRAVENOUS | Status: DC
  Administered 2012-07-08 (×2): 10 meq via INTRAVENOUS
  Filled 2012-07-08 (×6): qty 100

## 2012-07-08 MED ORDER — POTASSIUM CHLORIDE CRYS ER 20 MEQ PO TBCR
40.0000 meq | EXTENDED_RELEASE_TABLET | ORAL | Status: AC
  Administered 2012-07-08: 40 meq via ORAL
  Filled 2012-07-08: qty 2

## 2012-07-08 MED ORDER — OXYCODONE HCL 5 MG PO TABS
5.0000 mg | ORAL_TABLET | ORAL | Status: DC | PRN
  Administered 2012-07-08 – 2012-07-11 (×7): 5 mg via ORAL
  Filled 2012-07-08 (×7): qty 1

## 2012-07-08 MED ORDER — SODIUM CHLORIDE 0.9 % IV SOLN: INTRAVENOUS | Status: DC

## 2012-07-08 MED ORDER — DEXTROSE 5 % IV SOLN
2.0000 g | Freq: Once | INTRAVENOUS | Status: AC
  Administered 2012-07-08: 2 g via INTRAVENOUS
  Filled 2012-07-08: qty 2

## 2012-07-08 MED ORDER — POTASSIUM CHLORIDE CRYS ER 20 MEQ PO TBCR
40.0000 meq | EXTENDED_RELEASE_TABLET | ORAL | Status: AC
  Administered 2012-07-08 (×3): 40 meq via ORAL
  Filled 2012-07-08 (×3): qty 2

## 2012-07-08 MED ORDER — LORAZEPAM 2 MG/ML IJ SOLN
0.2500 mg | Freq: Three times a day (TID) | INTRAMUSCULAR | Status: DC | PRN
  Administered 2012-07-08: 0.26 mg via INTRAVENOUS
  Filled 2012-07-08: qty 1

## 2012-07-08 NOTE — Progress Notes (Signed)
CRITICAL VALUE ALERT  Critical value received: K+ 2.5  Date of notification:  07/08/2012  Time of notification:  06:14  Critical value read back:yes  Nurse who received alert:  Daphene Calamity RN  MD notified (1st page):  M. Lynch  Time of first page:  06:16  MD notified (2nd page):  Time of second page:  Responding MD:  >M Lynch  Time MD responded:  06:19  Received new orders.  Will continue to monitor pt. Daphene Calamity [place] 6:21 AM 07/08/2012

## 2012-07-08 NOTE — Evaluation (Signed)
Physical Therapy Evaluation Patient Details Name: Loretta Garcia MRN: [id number] DOB: January 21, 1920 Today's Date: 07/08/2012 Time: 1324-4010 PT Time Calculation (min): 31 min  PT Assessment / Plan / Recommendation Clinical Impression  pt is  s/p mecanical fall and  is greatly limited by pain at present;  will continue to follow in attempt to progress mobility inpreparation for post acute rehab    PT Assessment  Patient needs continued PT services    Follow Up Recommendations  Skilled nursing facility    Barriers to Discharge        Equipment Recommendations  None recommended by PT    Recommendations for Other Services     Frequency Min 3X/week    Precautions / Restrictions Precautions Precautions: Sternal Precaution Comments: sternum fx Restrictions Other Position/Activity Restrictions: none per chart---? WBing UEs...pt used RW prior to adm; discussed with RN, will leave note for MD   Pertinent Vitals/Pain       Mobility  Bed Mobility Bed Mobility: Supine to Sit;Rolling Right;Scooting to [facility] Rolling Right: 1: +2 Total assist (unable to complete due to pain) Rolling Right: Patient Percentage: 0% Supine to Sit: 1: +2 Total assist (unable to complete due to pain) Supine to Sit: Patient Percentage: 0% Scooting to HOB: 1: +2 Total assist (uanble to complete due to pain) Scooting to [facility]: Patient Percentage: 0% Details for Bed Mobility Assistance: pt unable to assist or follow commands due to pain/and apparent anxiety; RN gave IV pain meds although pt did not report any relief Transfers Transfers: Not assessed Details for Transfer Assistance: due to pain/pt refused    Shoulder Instructions     Exercises     PT Diagnosis: Generalized weakness;Acute pain  PT Problem List: Decreased strength;Decreased range of motion;Decreased activity tolerance;Decreased balance;Decreased mobility;Pain PT Treatment Interventions: DME instruction;Gait training;Functional mobility  training;Therapeutic activities;Therapeutic exercise;Balance training;Patient/family education   PT Goals Acute Rehab PT Goals PT Goal Formulation: With patient Time For Goal Achievement: 07/22/12 Potential to Achieve Goals: Good Pt will Roll Supine to Right Side: with min assist;with mod assist PT Goal: Rolling Supine to Right Side - Progress: Goal set today Pt will go Supine/Side to Sit: with mod assist PT Goal: Supine/Side to Sit - Progress: Goal set today Pt will go Sit to Stand: with mod assist PT Goal: Sit to Stand - Progress: Goal set today Pt will Transfer Bed to Chair/Chair to Bed: with mod assist PT Transfer Goal: Bed to Chair/Chair to Bed - Progress: Goal set today  Visit Information  Last PT Received On: 07/08/12 Assistance Needed: +2    Subjective Data  Subjective: I hurt all over  Patient Stated Goal: independent   Prior Functioning  Home Living Lives With: Alone Available Help at Discharge: Skilled Nursing Facility Type of Home: Apartment Home Layout: One level Home Adaptive Equipment: Walker - rolling Additional Comments: Independent living at [facility] Prior Function Level of Independence: Independent with assistive device(s) Communication Communication: No difficulties    Cognition  Overall Cognitive Status: Appears within functional limits for tasks assessed/performed Arousal/Alertness: Awake/alert Orientation Level: Appears intact for tasks assessed Behavior During Session: Anxious Cognition - Other Comments: pt extremely anxious. borderline agaitation at times due to pain issues, difficulty following commands due anxiety and pain    Extremity/Trunk Assessment Right Upper Extremity Assessment RUE ROM/Strength/Tone: Unable to fully assess;Due to pain;Deficits RUE ROM/Strength/Tone Deficits: pt moves RUE through full ROM per observation, does not follow instructions to limit shoulder flexion Left Upper Extremity Assessment LUE ROM/Strength/Tone:  Unable to  fully assess;Due to precautions Right Lower Extremity Assessment RLE ROM/Strength/Tone: Deficits;Due to pain RLE ROM/Strength/Tone Deficits: able to move knee and hip against gravity minimally due to pain; resistant to assist form PT Left Lower Extremity Assessment LLE ROM/Strength/Tone: Deficits;Due to pain LLE ROM/Strength/Tone Deficits: same as above + pt c/o more pain left hip   Balance    End of Session PT - End of Session Activity Tolerance: Patient limited by pain Patient left: in bed;with call bell/phone within reach Nurse Communication: Other (comment) (pt status/anxiety/limitations due to pain)  GP Functional Assessment Tool Used: clinical judgement Functional Limitation: Changing and maintaining body position;Mobility: Walking and moving around Mobility: Walking and Moving Around Current Status (Z6109): 100 percent impaired, limited or restricted Mobility: Walking and Moving Around Goal Status (U0454): At least 40 percent but less than 60 percent impaired, limited or restricted Changing and Maintaining Body Position Current Status (U9811): 100 percent impaired, limited or restricted Changing and Maintaining Body Position Goal Status (B1478): At least 40 percent but less than 60 percent impaired, limited or restricted   [facility] 07/08/2012, 11:39 AM

## 2012-07-08 NOTE — Progress Notes (Signed)
[facility] PROGRESS NOTE  Loretta Garcia ZOX:[id number] DOB: Sep 29, 1920 DOA: 07/06/2012 PCP: [name], MD  Assessment/Plan: 1-Fracture, sternum closed: with significant pain. Continue pain meds and also PT; will need SNF at discharge due to degree of assistance and decrease mobility. Will ask for weight bearing as tolerated and no lifting.  2-Fall at home: due to mild dehydration and UTI. Fluid resuscitation provided. Will treat UTI with Rocephin; cx's pending,.  3-Hypokalemia: will replete; magnesium in and repeat BMET in am.  4-UTI (lower urinary tract infection): continue rocephin; urine cx's pending.   Code Status: Patient will like to discussed with family members Family Communication: no family at bedside Disposition Plan: SNF at discharge   Brief narrative: 78 y.o. female with prior h/o of Hypertension lives at [facility] home, was brought to ED after a fall at home. As per the patient she was trying to sit, but fell to the ground, denies hitting her head, denies any dizziness or syncope. She reports since the fall her chest hurts and pointed towards the epigastric area. On arrival to ED, she had a CT chest without contrast showing a non displaced fracture involving the inferior aspect of the sternum. She was also found to have old fractures of the thoracic vertebrae and pubic ramus fractures.she lives by herself and was admitted to hospitalist service for observation for pain control.    Consultants:  PT  Antibiotics:  Rocephin 9/29  HPI/Subjective: Afebrile; complaining of generalized pain; but mainly in her chest. Limited mobility due to pain. No SOB, no abdominal pain. Patient reports dysuria.  Objective: Filed Vitals:   07/07/12 1500 07/07/12 2044 07/08/12 0710 07/08/12 1300  BP: 118/48 132/66 116/62 98/50  Pulse: 65 61 75 91  Temp: 98.8 F (37.1 C) 100.2 F (37.9 C) 98.9 F (37.2 C) 99.1 F (37.3 C)  TempSrc: Oral Oral Oral   Resp: 16 20 16  16   Height:      Weight:      SpO2: 100% 97% 100% 98%    Intake/Output Summary (Last 24 hours) at 07/08/12 1729 Last data filed at 07/08/12 1300  Gross per 24 hour  Intake   1530 ml  Output    925 ml  Net    605 ml   Filed Weights   07/06/12 1152 07/07/12 0030  Weight: 45.36 kg (100 lb) 45.4 kg (100 lb 1.4 oz)    Exam:   General:  NAD, afebrile  Cardiovascular: regular rate, no rubs or gallops  Respiratory: CTA bilaterally   Abdomen: soft, NT, ND; positive BS  Extremities: no edema, no cyanosis or clubbing  Neuro: weak and deconditioned due to pain; no focal deficit; AAOX3  Data Reviewed: Basic Metabolic Panel:  Lab 07/08/12 8119 07/06/12 1210  NA 136 134*  K 2.5* 3.1*  CL 98 90*  CO2 29 30  GLUCOSE 100* 98  BUN 11 16  CREATININE 0.57 0.66  CALCIUM 8.5 10.2  MG 1.7 --  PHOS -- --   CBC:  Lab 07/06/12 1210  WBC 8.6  NEUTROABS --  HGB 13.1  HCT 37.5  MCV 90.8  PLT 252   Cardiac Enzymes:  Lab 07/06/12 1210  CKTOTAL --  CKMB --  CKMBINDEX --  TROPONINI <0.30     Recent Results (from the past 240 hour(s))  MRSA PCR SCREENING     Status: Normal   Collection Time   07/07/12 10:12 PM      Component Value Range Status Comment   MRSA by  PCR NEGATIVE  NEGATIVE Final      Studies: Ct Chest Wo Contrast  07/06/2012  *RADIOLOGY REPORT*  Clinical Data: Cough.  Evaluate for sternal fracture.  CT CHEST WITHOUT CONTRAST  Technique:  Multidetector CT imaging of the chest was performed following the standard protocol without IV contrast.  Comparison: Chest radiograph 07/06/2012.  Findings: No pathologically enlarged mediastinal or axillary lymph nodes.  Hilar regions are difficult to definitively evaluate without IV contrast.  Ascending aorta measures 4.1 cm.  Heart size normal.  Scattered coronary artery calcification.  No pericardial effusion.  Biapical pleural parenchymal scarring.  A 7 mm (6 x 8 mm) nodule is seen in the right lower lobe (image 43).   Scattered scarring and/or atelectasis in the lower lobes. Additional scattered smaller nodules are seen predominantly in the left upper lobe.  Focal mucoid impaction in the right upper lobe (image 14).  No pleural fluid.  Airway is unremarkable.  Incidental imaging of the upper abdomen shows no acute findings. No worrisome lytic or sclerotic lesions.  Degenerative changes are seen in the spine.  Several cortical irregularity is seen along the posterior margin of the inferior sternum.  Lower thoracic compression fracture is age indeterminate.  IMPRESSION:  1.  Nondisplaced fracture involving the inferior aspect of the sternum. 2.  Lower thoracic compression fracture is age indeterminate. 3.  Scattered nodular densities in the lungs. If the patient is at high risk for bronchogenic carcinoma, follow-up chest CT at 3-6 months is recommended.  If the patient is at low risk for bronchogenic carcinoma, follow-up chest CT at 6-12 months is recommended.  This recommendation follows the consensus statement: Guidelines for Management of Small Pulmonary Nodules Detected on CT Scans: A Statement from the Fleischner Society as published in Radiology 2005; 237:395-400. 4.  Ascending aortic aneurysm.   Original Report Authenticated By: Reyes Ivan, M.D.     Scheduled Meds:   . aspirin EC  81 mg Oral Daily  . calcium carbonate  1 tablet Oral Daily  . cefTRIAXone (ROCEPHIN)  IV  1 g Intravenous Q24H  . cefTRIAXone (ROCEPHIN)  IV  2 g Intravenous Once  . enoxaparin (LOVENOX) injection  30 mg Subcutaneous Q24H  . indapamide  1.25 mg Oral Daily  . influenza  inactive virus vaccine  0.5 mL Intramuscular Tomorrow-1000  . potassium chloride  40 mEq Oral BID  . potassium chloride  40 mEq Oral NOW  . potassium chloride  40 mEq Oral Q4H  . vitamin C  500 mg Oral Daily  . DISCONTD: potassium chloride  10 mEq Intravenous Q1 Hr x 6   Continuous Infusions:   . sodium chloride    . DISCONTD: sodium chloride 75 mL/hr at  07/08/12 0412    Time spent: > 30 minutes    Augustus Zurawski  [facility] Pager [phone]. If 8PM-8AM, please contact night-coverage at [email or web address], password [facility] 07/08/2012, 5:29 PM  LOS: 2 days

## 2012-07-09 DIAGNOSIS — R079 Chest pain, unspecified: Secondary | ICD-10-CM | POA: Diagnosis not present

## 2012-07-09 DIAGNOSIS — S2220XA Unspecified fracture of sternum, initial encounter for closed fracture: Secondary | ICD-10-CM | POA: Diagnosis not present

## 2012-07-09 DIAGNOSIS — E876 Hypokalemia: Secondary | ICD-10-CM | POA: Diagnosis not present

## 2012-07-09 DIAGNOSIS — N390 Urinary tract infection, site not specified: Secondary | ICD-10-CM | POA: Diagnosis not present

## 2012-07-09 LAB — MAGNESIUM: Magnesium: 1.7 mg/dL (ref 1.5–2.5)

## 2012-07-09 LAB — BASIC METABOLIC PANEL
CO2: 27 mEq/L (ref 19–32)
GFR calc non Af Amer: 83 mL/min — ABNORMAL LOW (ref >90)
Glucose, Bld: 164 mg/dL — ABNORMAL HIGH (ref 70–99)
Potassium: 4.3 mEq/L (ref 3.5–5.1)
Sodium: 131 mEq/L — ABNORMAL LOW (ref 135–145)

## 2012-07-09 LAB — PHOSPHORUS: Phosphorus: 1.5 mg/dL — ABNORMAL LOW (ref 2.3–4.6)

## 2012-07-09 NOTE — Progress Notes (Signed)
Attempted to see pt today.  Pt had PT this am and was unable to tolerate any mobility at all including rolling due to extreme pain.  Pain meds were given and pt is currently asleep.  Will attempt back when  Pt ready for mobility and pain under better control. Tory Emerald, [place] [phone]

## 2012-07-09 NOTE — Clinical Social Work Psychosocial (Signed)
     Clinical Social Work Department BRIEF PSYCHOSOCIAL ASSESSMENT 07/09/2012  Patient:  Loretta Garcia, Loretta Garcia     Account Number:  [id number]     Admit date:  07/06/2012  Clinical Social Worker:  Robin Searing  Date/Time:  07/09/2012 03:40 PM  Referred by:  Physician  Date Referred:  07/09/2012 Referred for  SNF Placement   Other Referral:   Interview type:  Patient Other interview type:   Patient in a lot of pain and ask that I f/u with her tomorrow- she is s/p fall- lives in IND LVG at [facility]- fell while visiting her husband in SNF unit at [facility]- will likely need SNF at dc/    PSYCHOSOCIAL DATA Living Status:  ALONE Admitted from facility:  [facility] Level of care:  Independent Living Primary support name:  Daughter Wish Primary support relationship to patient:  FAMILY Degree of support available:   good    CURRENT CONCERNS Current Concerns  Post-Acute Placement   Other Concerns:   PT consult pending    SOCIAL WORK ASSESSMENT / PLAN Patient will likely need SNF at d/c- attempting to reach daughter and will await PT input for ?SNF   Assessment/plan status:  Other - See comment Other assessment/ plan:   FL2 and [place] completion   Information/referral to community resources:   SNF    PATIENTS/FAMILYS RESPONSE TO PLAN OF CARE: Patient agreeable- drowsy and in pain

## 2012-07-09 NOTE — Progress Notes (Signed)
Pt complaining of neck pain because she slept with a pillow last night. States she doesn't sleep with one at home. Asked why she used one last night, and she said because the nurse gave it to me. Told her she needed to tell the nurse she didn't use one. Pt. Has difficulty moving at all because the pain in her sternum and lt. Hip. Pt refusing to reposition in bed and refuses to get out of bed.

## 2012-07-09 NOTE — Progress Notes (Signed)
[facility] PROGRESS NOTE  Robertha Staples Meline YNW:[id number] DOB: October 29, 1919 DOA: 07/06/2012 PCP: [name], MD  Assessment/Plan: 1-Fracture, sternum closed: with significant pain. Continue pain meds and also PT; will arrange for SNF at discharge due to degree of assistance and decrease mobility. Weight bearing as tolerated and no lifting. Continue IS  2-Fall at home: due to mild dehydration and UTI. Fluid resuscitation provided. Will treat UTI with Rocephin; cx's pending,.  3-Hypokalemia: repleted  4-Gram negative rods UTI (lower urinary tract infection): continue rocephin; urine cx's pending.   Code Status: Patient will like to discussed with family members Family Communication: no family at bedside Disposition Plan: SNF at discharge   Brief narrative: 78 y.o. female with prior h/o of Hypertension lives at [facility] home, was brought to ED after a fall at home. As per the patient she was trying to sit, but fell to the ground, denies hitting her head, denies any dizziness or syncope. She reports since the fall her chest hurts and pointed towards the epigastric area. On arrival to ED, she had a CT chest without contrast showing a non displaced fracture involving the inferior aspect of the sternum. She was also found to have old fractures of the thoracic vertebrae and pubic ramus fractures.she lives by herself and was admitted to hospitalist service for observation for pain control.    Consultants:  PT  Antibiotics:  Rocephin 9/29  HPI/Subjective: Afebrile; complaining of generalized pain; but mainly in her chest. Limited mobility due to pain. No abdominal pain. No nausea or vomiting. Some neck pain due to bad posture of her neck yesterday night.  Objective: Filed Vitals:   07/08/12 1300 07/08/12 2224 07/09/12 0603 07/09/12 1400  BP: 98/50 120/47 135/54 149/71  Pulse: 91 84 84 89  Temp: 99.1 F (37.3 C) 98.6 F (37 C) 98.2 F (36.8 C) 99.7 F (37.6 C)  TempSrc:   Oral Oral Oral  Resp: 16 16 20 18   Height:      Weight:      SpO2: 98% 97% 97% 100%    Intake/Output Summary (Last 24 hours) at 07/09/12 1841 Last data filed at 07/09/12 1700  Gross per 24 hour  Intake   2742 ml  Output    575 ml  Net   2167 ml   Filed Weights   07/06/12 1152 07/07/12 0030  Weight: 45.36 kg (100 lb) 45.4 kg (100 lb 1.4 oz)    Exam:   General:  NAD, afebrile  Cardiovascular: regular rate, no rubs or gallops  Respiratory: CTA bilaterally; except for decreased BS at bases  Abdomen: soft, NT, ND; positive BS  Extremities: no edema, no cyanosis or clubbing  Neuro: weak and deconditioned due to pain; no focal deficit; AAOX3  Data Reviewed: Basic Metabolic Panel:  Lab 07/09/12 6578 07/08/12 0418 07/06/12 1210  NA 131* 136 134*  K 4.3 2.5* 3.1*  CL 96 98 90*  CO2 27 29 30   GLUCOSE 164* 100* 98  BUN 11 11 16   CREATININE 0.50 0.57 0.66  CALCIUM 9.0 8.5 10.2  MG 1.7 1.7 --  PHOS 1.5* -- --   CBC:  Lab 07/06/12 1210  WBC 8.6  NEUTROABS --  HGB 13.1  HCT 37.5  MCV 90.8  PLT 252   Cardiac Enzymes:  Lab 07/06/12 1210  CKTOTAL --  CKMB --  CKMBINDEX --  TROPONINI <0.30     Recent Results (from the past 240 hour(s))  URINE CULTURE     Status: Normal (Preliminary result)  Collection Time   07/07/12  4:17 AM      Component Value Range Status Comment   Specimen Description URINE, RANDOM   Final    Special Requests none   Final    Culture  Setup Time 07/08/2012 14:00   Final    Colony Count >=100,000 COLONIES/ML   Final    Culture GRAM NEGATIVE RODS   Final    Report Status PENDING   Incomplete   MRSA PCR SCREENING     Status: Normal   Collection Time   07/07/12 10:12 PM      Component Value Range Status Comment   MRSA by PCR NEGATIVE  NEGATIVE Final      Studies: No results found.  Scheduled Meds:    . aspirin EC  81 mg Oral Daily  . calcium carbonate  1 tablet Oral Daily  . cefTRIAXone (ROCEPHIN)  IV  1 g Intravenous Q24H  .  enoxaparin (LOVENOX) injection  30 mg Subcutaneous Q24H  . indapamide  1.25 mg Oral Daily  . influenza  inactive virus vaccine  0.5 mL Intramuscular Tomorrow-1000  . potassium chloride  40 mEq Oral Q4H  . vitamin C  500 mg Oral Daily   Continuous Infusions:    . sodium chloride 10 mL/hr at 07/08/12 2100    Time spent: > 25  minutes    Shanaiya Bene  [facility] Pager [phone]. If 8PM-8AM, please contact night-coverage at [email or web address], password [facility] 07/09/2012, 6:41 PM  LOS: 3 days

## 2012-07-09 NOTE — Clinical Social Work Placement (Signed)
     Clinical Social Work Department CLINICAL SOCIAL WORK PLACEMENT NOTE 07/09/2012  Patient:  Loretta Garcia, Loretta Garcia  Account Number:  [id number] Admit date:  07/06/2012  Clinical Social Worker:  Robin Searing  Date/time:  07/09/2012 04:16 PM  Clinical Social Work is seeking post-discharge placement for this patient at the following level of care:   SKILLED NURSING   (*CSW will update this form in Epic as items are completed)   07/09/2012  Patient/family provided with [facility] Department of Clinical Social Works list of facilities offering this level of care within the geographic area requested by the patient (or if unable, by the patients family).  07/09/2012  Patient/family informed of their freedom to choose among providers that offer the needed level of care, that participate in Medicare, Medicaid or managed care program needed by the patient, have an available bed and are willing to accept the patient.  07/09/2012  Patient/family informed of [facility] ownership interest in [facility], as well as of the fact that they are under no obligation to receive care at this facility.  PASARR submitted to EDS on 07/09/2012 PASARR number received from EDS on   FL2 transmitted to all facilities in geographic area requested by pt/family on   FL2 transmitted to all facilities within larger geographic area on   Patient informed that his/her managed care company has contracts with or will negotiate with  certain facilities, including the following:     Patient/family informed of bed offers received:   Patient chooses bed at  Physician recommends and patient chooses bed at    Patient to be transferred to  on   Patient to be transferred to facility by   The following physician request were entered in Epic:   Additional Comments:

## 2012-07-10 ENCOUNTER — Inpatient Hospital Stay (HOSPITAL_COMMUNITY): Payer: Medicare Other

## 2012-07-10 DIAGNOSIS — M542 Cervicalgia: Secondary | ICD-10-CM | POA: Diagnosis not present

## 2012-07-10 DIAGNOSIS — S2220XA Unspecified fracture of sternum, initial encounter for closed fracture: Secondary | ICD-10-CM | POA: Diagnosis not present

## 2012-07-10 DIAGNOSIS — M25559 Pain in unspecified hip: Secondary | ICD-10-CM | POA: Diagnosis not present

## 2012-07-10 DIAGNOSIS — W19XXXA Unspecified fall, initial encounter: Secondary | ICD-10-CM | POA: Diagnosis not present

## 2012-07-10 DIAGNOSIS — Y92009 Unspecified place in unspecified non-institutional (private) residence as the place of occurrence of the external cause: Secondary | ICD-10-CM | POA: Diagnosis not present

## 2012-07-10 LAB — URINE CULTURE: Colony Count: >=100000

## 2012-07-10 NOTE — Progress Notes (Signed)
[facility] PROGRESS NOTE  Loretta Garcia ZOX:[id number] DOB: 26-Aug-1920 DOA: 07/06/2012 PCP: [name], MD  Assessment/Plan: 1-Fracture, sternum closed: with significant pain. Continue pain meds and also PT; will arrange for SNF at discharge due to degree of assistance and decrease mobility. Weight bearing as tolerated and no lifting. Continue IS  2-Fall at home: due to mild dehydration and UTI. Fluid resuscitation provided. Will treat UTI with Rocephin.  3-Hypokalemia: repleted  4-Klebsiella Pneumoniae UTI (lower urinary tract infection): patient spike low grade fever overnight; will continue IV rocephin for another 24 hours; urine cx's demonstrating k. Pneumonia sensitive to rocephin and also levaquin. Consider discharge on PO levaquin to finish antibiotic therapy as an outpatient.  5-Low grade fever: most likely due to UTI; continue abx;s and PRN tylenol.  6-neck pain: most likely torticollis,; will use PRN robaxin. Due to recent fall will be thorough and also check cervical x-ray.    Code Status: Patient will like to discussed with family members Family Communication: no family at bedside Disposition Plan: SNF at discharge   Brief narrative: 78 y.o. female with prior h/o of Hypertension lives at [facility] home, was brought to ED after a fall at home. As per the patient she was trying to sit, but fell to the ground, denies hitting her head, denies any dizziness or syncope. She reports since the fall her chest hurts and pointed towards the epigastric area. On arrival to ED, she had a CT chest without contrast showing a non displaced fracture involving the inferior aspect of the sternum. She was also found to have old fractures of the thoracic vertebrae and pubic ramus fractures.she lives by herself and was admitted to hospitalist service for observation for pain control.    Consultants:  PT  Antibiotics:  Rocephin 9/29  HPI/Subjective: Low grade fever overnight;  still with significant pain on her sternum and also pain on her neck with limited range of motion. No abdominal pain. No nausea or vomiting.   Objective: Filed Vitals:   07/09/12 1400 07/09/12 2231 07/10/12 0648 07/10/12 1500  BP: 149/71 138/63 131/60 117/58  Pulse: 89 96 88 112  Temp: 99.7 F (37.6 C) 100.4 F (38 C) 99 F (37.2 C) 98.2 F (36.8 C)  TempSrc: Oral Oral Oral   Resp: 18 18 16 20   Height:      Weight:      SpO2: 100% 95% 97% 100%    Intake/Output Summary (Last 24 hours) at 07/10/12 1659 Last data filed at 07/10/12 1500  Gross per 24 hour  Intake    920 ml  Output    850 ml  Net     70 ml   Filed Weights   07/06/12 1152 07/07/12 0030  Weight: 45.36 kg (100 lb) 45.4 kg (100 lb 1.4 oz)    Exam:   General:  Mild distress due to pain; low grade fever overnight; no nausea or vomiting  Cardiovascular: regular rate, no rubs or gallops  Respiratory: CTA bilaterally; except for decreased BS at bases  Abdomen: soft, NT, ND; positive BS  Extremities: no edema, no cyanosis or clubbing  Neuro: weak and deconditioned due to pain; no focal deficit; AAOX3  Data Reviewed: Basic Metabolic Panel:  Lab 07/09/12 8119 07/08/12 0418 07/06/12 1210  NA 131* 136 134*  K 4.3 2.5* 3.1*  CL 96 98 90*  CO2 27 29 30   GLUCOSE 164* 100* 98  BUN 11 11 16   CREATININE 0.50 0.57 0.66  CALCIUM 9.0 8.5 10.2  MG 1.7 1.7 --  PHOS 1.5* -- --   CBC:  Lab 07/06/12 1210  WBC 8.6  NEUTROABS --  HGB 13.1  HCT 37.5  MCV 90.8  PLT 252   Cardiac Enzymes:  Lab 07/06/12 1210  CKTOTAL --  CKMB --  CKMBINDEX --  TROPONINI <0.30     Recent Results (from the past 240 hour(s))  URINE CULTURE     Status: Normal   Collection Time   07/07/12  4:17 AM      Component Value Range Status Comment   Specimen Description URINE, RANDOM   Final    Special Requests none   Final    Culture  Setup Time 07/08/2012 14:00   Final    Colony Count >=100,000 COLONIES/ML   Final    Culture  KLEBSIELLA PNEUMONIAE   Final    Report Status 07/10/2012 FINAL   Final    Organism ID, Bacteria KLEBSIELLA PNEUMONIAE   Final   MRSA PCR SCREENING     Status: Normal   Collection Time   07/07/12 10:12 PM      Component Value Range Status Comment   MRSA by PCR NEGATIVE  NEGATIVE Final      Studies: No results found.  Scheduled Meds:    . aspirin EC  81 mg Oral Daily  . calcium carbonate  1 tablet Oral Daily  . cefTRIAXone (ROCEPHIN)  IV  1 g Intravenous Q24H  . enoxaparin (LOVENOX) injection  30 mg Subcutaneous Q24H  . indapamide  1.25 mg Oral Daily  . vitamin C  500 mg Oral Daily   Continuous Infusions:    . sodium chloride 10 mL/hr at 07/08/12 2100    Time spent: > 25  minutes    Lamara Brecht  [facility] Pager [phone]. If 8PM-8AM, please contact night-coverage at [email or web address], password [facility] 07/10/2012, 4:59 PM  LOS: 4 days

## 2012-07-10 NOTE — Progress Notes (Signed)
Physical Therapy Treatment Patient Details Name: Loretta Garcia MRN: [id number] DOB: January 13, 1920 Today's Date: 07/10/2012 Time: 1345-1410 PT Time Calculation (min): 25 min  PT Assessment / Plan / Recommendation Comments on Treatment Session  pt continues to be very limited by pain.  She was very sleepy this afternoon. Will try to see pt in am next visit so she will be more awake    Follow Up Recommendations       Barriers to Discharge        Equipment Recommendations       Recommendations for Other Services    Frequency Min 3X/week   Plan Discharge plan remains appropriate;Frequency remains appropriate    Precautions / Restrictions Precautions Precautions: Sternal Precaution Comments: sternum fx Restrictions Other Position/Activity Restrictions: no lifting more than 5 lbs   Pertinent Vitals/Pain Pt c/o severe pain in neck  Pt lethargic and not able to participate with PT this afternoon. I understand she was walking better this am    Mobility  Bed Mobility Bed Mobility: Sit to Supine Sit to Supine: 1: +2 Total assist Sit to Supine: Patient Percentage: 0% Scooting to HOB: 1: +2 Total assist Scooting to [facility]: Patient Percentage: 0% Details for Bed Mobility Assistance: pt had to be lifted with bed pad Transfers Transfers: Sit to Stand;Stand to Sit Sit to Stand: 1: +2 Total assist Sit to Stand: Patient Percentage: 10% Stand to Sit: 1: +2 Total assist Stand to Sit: Patient Percentage: 10% Details for Transfer Assistance: pt had to be moved with bed pad to stand up on her feet Ambulation/Gait Ambulation/Gait Assistance: 1: +2 Total assist Ambulation/Gait: Patient Percentage: 20% Ambulation Distance (Feet): 2 Feet Assistive device: Rolling walker Ambulation/Gait Assistance Details: constant cues to shift weight, move feet and stand erect to move from chair to bed Gait Pattern: Step-to pattern Gait velocity: extremely slow General Gait Details: pt very limited in gait this  afternoon. Apparently she did much better earlier today with nursing Stairs: No Wheelchair Mobility Wheelchair Mobility: No    Exercises     PT Diagnosis:    PT Problem List:   PT Treatment Interventions:     PT Goals Acute Rehab PT Goals PT Goal Formulation: With patient Time For Goal Achievement: 07/22/12 Potential to Achieve Goals: Good Pt will Roll Supine to Right Side: with min assist;with mod assist PT Goal: Rolling Supine to Right Side - Progress: Not progressing Pt will go Supine/Side to Sit: with mod assist PT Goal: Supine/Side to Sit - Progress: Not progressing Pt will go Sit to Stand: with mod assist PT Goal: Sit to Stand - Progress: Not progressing Pt will Transfer Bed to Chair/Chair to Bed: with mod assist PT Transfer Goal: Bed to Chair/Chair to Bed - Progress: Not progressing  Visit Information  Last PT Received On: 07/10/12 Assistance Needed: +2    Subjective Data  Subjective: "My neck hurts!!" Patient Stated Goal: non stated   Cognition  Overall Cognitive Status: Difficult to assess Difficult to assess due to: Level of arousal Arousal/Alertness: Lethargic Behavior During Session: Lethargic Cognition - Other Comments: pt very sleepy, took extra time to wake up. pt did not speak very much and resisted any attempts to try to move her    Balance     End of Session PT - End of Session Activity Tolerance: Patient limited by pain;Treatment limited secondary to medical complications (Comment);Other (comment) (pt very sleepy this afternoon) Patient left: in bed;with call bell/phone within reach;with nursing in room Nurse Communication: Mobility status  GP     Bayard Hugger. [place], [place] [phone] 07/10/2012, 3:55 PM

## 2012-07-10 NOTE — Progress Notes (Signed)
SNF bed confirmed for patient at [facility] where she resides in [facility]. Will await MD for tentative d.c date- PT to see today  Reece Levy, MSW, [employer] [phone]

## 2012-07-10 NOTE — Evaluation (Signed)
Occupational Therapy Evaluation Patient Details Name: Loretta Garcia MRN: [id number] DOB: 07/19/1920 Today's Date: 07/10/2012 Time: 1040-1102 OT Time Calculation (min): 22 min  OT Assessment / Plan / Recommendation Clinical Impression  This 78 year old female was admitted after a mechanical fall and presents with sternum fx and soreness throughout body which limits her adls.  At baseline, she was mod I.  She is appropriate for skilled OT to increase independence with adls to increase activity tolerance and increase independence with adls, at min to mod A level in acute    OT Assessment  Patient needs continued OT Services    Follow Up Recommendations  Skilled nursing facility    Barriers to Discharge      Equipment Recommendations  None recommended by PT;None recommended by OT    Recommendations for Other Services    Frequency  Min 2X/week    Precautions / Restrictions Precautions Precautions: Sternal Precaution Comments: sternum fx Restrictions Other Position/Activity Restrictions: no lifting more than 5 lbs   Pertinent Vitals/Pain Premedicated.  Pain throughout body especially neck and sternum.  Repositioned and asked RN if hot pack could be provided for neck pain    ADL  Grooming: Performed;Minimal assistance;Brushing hair (daughter assisted her) Where Assessed - Grooming: Supported sitting Upper Body Bathing: Simulated;Moderate assistance Where Assessed - Upper Body Bathing: Supported sitting Lower Body Bathing: Simulated;Maximal assistance Where Assessed - Lower Body Bathing: Supported sit to stand Upper Body Dressing: Simulated;Maximal assistance Where Assessed - Upper Body Dressing: Supported sitting Lower Body Dressing: Simulated;+1 Total assistance Where Assessed - Lower Body Dressing: Supported sit to Pharmacist, hospital: Performed;Minimal assistance (mod A for sit to stand once; min once) Toilet Transfer Method: Stand pivot (min cues for stepping over.  No  cues hand placement) Toilet Transfer Equipment: Bedside commode Toileting - Clothing Manipulation and Hygiene: Performed;Minimal assistance Where Assessed - Toileting Clothing Manipulation and Hygiene: Standing Transfers/Ambulation Related to ADLs: slow, deliberate movements.  Pt with pain throughout--especially neck after using a pillow last night ADL Comments: able to reach to head with bil UE's.  Everything is sore--pt premedicated    OT Diagnosis: Generalized weakness  OT Problem List: Decreased strength;Decreased activity tolerance;Cardiopulmonary status limiting activity;Impaired UE functional use;Pain (ues limited due to pain) OT Treatment Interventions: Self-care/ADL training;DME and/or AE instruction;Therapeutic activities;Patient/family education;Balance training   OT Goals Acute Rehab OT Goals OT Goal Formulation: With patient Time For Goal Achievement: 07/24/12 Potential to Achieve Goals: Good ADL Goals Pt Will Perform Grooming: with supervision;Standing at sink ADL Goal: Grooming - Progress: Goal set today Pt Will Perform Upper Body Bathing: with min assist;Sitting, chair;Supported ADL Goal: Product manager - Progress: Goal set today Pt Will Perform Lower Body Bathing: with mod assist;Sit to stand from chair;with adaptive equipment ADL Goal: Lower Body Bathing - Progress: Goal set today Pt Will Transfer to Toilet: with min assist;Stand pivot transfer;3-in-1 (min guard level all aspects transfer and toileting)  Visit Information  Last OT Received On: 07/10/12 Assistance Needed: +1    Subjective Data  Subjective: I can't do much Patient Stated Goal: [name] for rehab; to be near husband who is in alzheimer unit   Prior Functioning     Home Living Lives With: Alone Available Help at Discharge: Skilled Nursing Facility Prior Function Level of Independence: Independent with assistive device(s) Driving: Yes Communication Communication: No  difficulties Dominant Hand: Right         Vision/Perception     Cognition  Overall Cognitive Status: Appears within functional limits  for tasks assessed/performed Arousal/Alertness: Awake/alert Orientation Level: Appears intact for tasks assessed Behavior During Session: [facility] for tasks performed    Extremity/Trunk Assessment Right Upper Extremity Assessment RUE ROM/Strength/Tone: Deficits RUE ROM/Strength/Tone Deficits: able to lift bil ues to 90 degrees actively Left Upper Extremity Assessment LUE ROM/Strength/Tone: Deficits LUE ROM/Strength/Tone Deficits: same as above     Mobility Transfers Transfers: Sit to Stand Sit to Stand: 4: Min assist;3: Mod assist (two times:  from chair then [facility])     Shoulder Instructions     Exercise     Balance     End of Session OT - End of Session Activity Tolerance: Patient limited by pain Patient left: in chair;with call bell/phone within reach;with family/visitor present  GO     Matina Rodier 07/10/2012, 11:27 AM Marica Otter, OTR/L [phone] 07/10/2012

## 2012-07-11 DIAGNOSIS — I10 Essential (primary) hypertension: Secondary | ICD-10-CM | POA: Diagnosis not present

## 2012-07-11 DIAGNOSIS — M25559 Pain in unspecified hip: Secondary | ICD-10-CM | POA: Diagnosis not present

## 2012-07-11 DIAGNOSIS — E876 Hypokalemia: Secondary | ICD-10-CM | POA: Diagnosis not present

## 2012-07-11 DIAGNOSIS — E871 Hypo-osmolality and hyponatremia: Secondary | ICD-10-CM | POA: Diagnosis not present

## 2012-07-11 DIAGNOSIS — N390 Urinary tract infection, site not specified: Secondary | ICD-10-CM | POA: Diagnosis not present

## 2012-07-11 DIAGNOSIS — Y92009 Unspecified place in unspecified non-institutional (private) residence as the place of occurrence of the external cause: Secondary | ICD-10-CM | POA: Diagnosis not present

## 2012-07-11 DIAGNOSIS — I119 Hypertensive heart disease without heart failure: Secondary | ICD-10-CM | POA: Diagnosis not present

## 2012-07-11 DIAGNOSIS — S2220XA Unspecified fracture of sternum, initial encounter for closed fracture: Secondary | ICD-10-CM | POA: Diagnosis not present

## 2012-07-11 DIAGNOSIS — T148XXA Other injury of unspecified body region, initial encounter: Secondary | ICD-10-CM | POA: Diagnosis not present

## 2012-07-11 DIAGNOSIS — Z5189 Encounter for other specified aftercare: Secondary | ICD-10-CM | POA: Diagnosis not present

## 2012-07-11 DIAGNOSIS — N952 Postmenopausal atrophic vaginitis: Secondary | ICD-10-CM | POA: Diagnosis not present

## 2012-07-11 DIAGNOSIS — K5900 Constipation, unspecified: Secondary | ICD-10-CM | POA: Diagnosis not present

## 2012-07-11 DIAGNOSIS — Z9181 History of falling: Secondary | ICD-10-CM | POA: Diagnosis not present

## 2012-07-11 DIAGNOSIS — F329 Major depressive disorder, single episode, unspecified: Secondary | ICD-10-CM | POA: Diagnosis not present

## 2012-07-11 DIAGNOSIS — M549 Dorsalgia, unspecified: Secondary | ICD-10-CM | POA: Diagnosis not present

## 2012-07-11 DIAGNOSIS — M8448XD Pathological fracture, other site, subsequent encounter for fracture with routine healing: Secondary | ICD-10-CM | POA: Diagnosis not present

## 2012-07-11 DIAGNOSIS — W19XXXA Unspecified fall, initial encounter: Secondary | ICD-10-CM | POA: Diagnosis not present

## 2012-07-11 LAB — BASIC METABOLIC PANEL
CO2: 29 mEq/L (ref 19–32)
Calcium: 9.7 mg/dL (ref 8.4–10.5)
GFR calc Af Amer: >90 mL/min (ref >90)
Sodium: 127 mEq/L — ABNORMAL LOW (ref 135–145)

## 2012-07-11 MED ORDER — CIPROFLOXACIN HCL 500 MG PO TABS: 500.0000 mg | ORAL_TABLET | Freq: Two times a day (BID) | ORAL | Status: DC

## 2012-07-11 MED ORDER — HYDROCODONE-ACETAMINOPHEN 5-325 MG PO TABS: 1.0000 | ORAL_TABLET | Freq: Four times a day (QID) | ORAL | Status: DC | PRN

## 2012-07-11 MED ORDER — INFLUENZA VIRUS VACC SPLIT PF IM SUSP
0.5000 mL | INTRAMUSCULAR | Status: AC
  Administered 2012-07-11: 0.5 mL via INTRAMUSCULAR
  Filled 2012-07-11: qty 0.5

## 2012-07-11 MED ORDER — CIPROFLOXACIN HCL 500 MG PO TABS
500.0000 mg | ORAL_TABLET | Freq: Two times a day (BID) | ORAL | Status: DC
  Administered 2012-07-11: 500 mg via ORAL
  Filled 2012-07-11 (×2): qty 1

## 2012-07-11 MED ORDER — INDAPAMIDE 1.25 MG PO TABS: 1.2500 mg | ORAL_TABLET | Freq: Every day | ORAL | Status: DC

## 2012-07-11 NOTE — Progress Notes (Signed)
Patient for d/c back to SNF bed at [facility] today- patient agreeable- eager to get back and see her husband who is in memory care. Daughter aware- plan transfer via EMS> Maeli Spacek, MSW, [place] [phone]

## 2012-07-11 NOTE — Discharge Summary (Signed)
Physician Discharge Summary  Loretta Garcia OZH:[id number] DOB: 1920-01-04 DOA: 07/06/2012  PCP: [name], MD  Admit date: 07/06/2012 Discharge date: 07/11/2012  Recommendations for Outpatient Follow-up:  1. Follow up with a BMP tomorrow to check sodium level. 2. Follow up with PCP in one week. 3. If the neck pain doesn't improve recommend checking a CT of the cervical spine, as the x ray of the cervical spine showed chronic severe spondylosis.   Discharge Diagnoses:  Principal Problem:  *Fracture, sternum closed Active Problems:  Fall at home  Hypokalemia  UTI (lower urinary tract infection)   Discharge Condition: stable  Diet recommendation: regular.  Filed Weights   07/06/12 1152 07/07/12 0030  Weight: 45.36 kg (100 lb) 45.4 kg (100 lb 1.4 oz)    History of present illness:   78 y.o. female with prior h/o of Hypertension lives at [facility] home, was brought to ED after a fall at home. As per the patient she was trying to sit, but fell to the ground, denies hitting her head, denies any dizziness or syncope. She reports since the fall her chest hurts and pointed towards the epigastric area. On arrival to ED, she had a CT chest without contrast showing a non displaced fracture involving the inferior aspect of the sternum. She was also found to have old fractures of the thoracic vertebrae and pubic ramus fractures.she lives by herself and was admitted to hospitalist service for observation for pain control.   Hospital Course:  1-Fracture, sternum closed: with significant pain. Continue with pain medications and continue with rehabilitation due to degree of assistance and decrease mobility. Weight bearing as tolerated and no lifting.  2- Mechanical Fall at home: due to mild dehydration and UTI. Fluid resuscitation provided. She was initially treated with rocephinfor UTI, her cultures grew Klebsiella and she will be discharged on oral ciprofloxacin.  3-Hypokalemia: repleted    4-Klebsiella Pneumoniae UTI (lower urinary tract infection): urine cx's demonstrating k. Pneumonia sensitive to rocephin and also ciprofloxacin. Will discharge her with oral ciprofloxacin to complete the course for UTI.  5-Low grade fever: most likely due to UTI; continue abx;s and PRN tylenol.  6-neck pain: most likely torticollis,from the recent fall. She was given robaxin, but it was making her too drowsy, will stop the robaxin and continue with pain medications. Recommend checking a CT cervical spine if the neck pain does not improve. The X RAY of the cervical spine showed severe spondylosis.  7. Hyponatremia: probably secondary to a combination of pain and indapamide. Recommend holding the indapamide for a few days till the sodium level normalizes.  Recommend checking sodium level with a bmp tomorrow at the facility.  Indapamide can be restarted at a later date at the discretion of her PCP or can be changed to a different anti hypertensive.   Procedures:  X ray of the cervical spine  Ct chest without contrast.   Consultations:  none  Discharge Exam: Filed Vitals:   07/09/12 2231 07/10/12 0648 07/10/12 1500 07/11/12 0500  BP: 138/63 131/60 117/58 129/79  Pulse: 96 88 112 86  Temp: 100.4 F (38 C) 99 F (37.2 C) 98.2 F (36.8 C) 98.5 F (36.9 C)  TempSrc: Oral Oral  Oral  Resp: 18 16 20 16   Height:      Weight:      SpO2: 95% 97% 100% 98%    General: alert afebrile comfortable, looking forward for discharge so she can see her husband Cardiovascular: s1s2 , RRR, no mrg  Respiratory: CTAB, no wheezing or rhonchi Abdomen: still tender in the epigastric area , BS+ Extremties: no pedal edema.   Discharge Instructions      Discharge Orders    Future Orders Please Complete By Expires   Discharge instructions      Comments:   Weight bearing as tolerated. No lifting Follow up with a sodium level tomorrow. Follow up with a ct cervical spine if the symptoms of neck pain  doesn't improve in 2 to 3 das.  We are currently holding the robaxin as its making you very drowsy.   Activity as tolerated - No restrictions          Medication List     As of 07/11/2012 12:41 PM    TAKE these medications         aspirin EC 81 MG tablet   Take 81 mg by mouth daily.      calcium carbonate 1250 MG tablet   Commonly known as: OS-CAL - dosed in mg of elemental calcium   Take 1 tablet by mouth daily.      ciprofloxacin 500 MG tablet   Commonly known as: CIPRO   Take 1 tablet (500 mg total) by mouth every 12 (twelve) hours.      HYDROcodone-acetaminophen 5-325 MG per tablet   Commonly known as: NORCO/VICODIN   Take 1 tablet by mouth every 6 (six) hours as needed for pain.      indapamide 1.25 MG tablet   Commonly known as: LOZOL   Take 1 tablet (1.25 mg total) by mouth daily.please hold indapamide for a few days till the sodium comes back normal. It canbe restarted at a later date at the discretion of her pcp or change to a different anti hypertensive.       multivitamin with minerals Tabs   Take 1 tablet by mouth daily.      vitamin C 500 MG tablet   Commonly known as: ASCORBIC ACID   Take 500 mg by mouth daily.        Follow-up Information    Follow up with [name], MD. (in 2-3 days if not improved)    Contact information:   [address] [place] [address] [phone]           The results of significant diagnostics from this hospitalization (including imaging, microbiology, ancillary and laboratory) are listed below for reference.    Significant Diagnostic Studies: Dg Chest 2 View  07/06/2012  *RADIOLOGY REPORT*  Clinical Data: Fall with severe chest pain.  CHEST - 2 VIEW  Comparison: 12/19/2007.  Findings: Trachea is midline.  Heart size normal.  Thoracic aorta is calcified.  Biapical pleural thickening, left greater than right.  Question mild subpleural scarring at the right costophrenic angle.  No definite  pleural fluid.  Osteopenia makes assessment of the lower thoracic spine difficult on the lateral view.  IMPRESSION: No acute findings.   Original Report Authenticated By: Reyes Ivan, M.D.    Dg Cervical Spine 2-3 Views  07/10/2012  *RADIOLOGY REPORT*  Clinical Data: Recent fall with sternal fracture.  Neck pain and reduced mobility.  CERVICAL SPINE - 2-3 VIEW  Comparison: None.  Findings: Prominent spondylosis noted.  Bony demineralization is present.  Marked loss of disc space at C4-5, C5-6, and C6-7 noted with posterior osseous ridging.  Stable 2 mm of anterior subluxation of C3 on C4.  Stable 3 mm anterior subluxation of C7 on T1.  Odontoid not well seen.  Lateral masses of C1 obscured.  Carotid atherosclerotic calcifications noted.  IMPRESSION:  1.  Poor visualization of the components of the upper cervical spine, particularly the odontoid and lateral masses of C1. 2.  Chronic subluxations at C3-4 and C7-T1, with severe spondylosis at C4-5, C5-6, and C6-7. 3.  Although no obvious fracture or obvious new subluxation is observed, the difficulty in positioning this patient combined with the severity of spondylosis prominently reduces sensitivity in assessing for cervical spine fracture, and CT of the cervical spine is recommended.   Original Report Authenticated By: Dellia Cloud, M.D.    Dg Hip Complete Left  07/06/2012  *RADIOLOGY REPORT*  Clinical Data: Left hip pain status post fall.  LEFT HIP - COMPLETE 2+ VIEW  Comparison: 05/03/2010 radiograph  Findings: Diffuse osteopenia.  Bilateral total hip arthroplasties. No dislocation.  No periprosthetic lucency. Inferior pubic ramus fracture bilaterally with callus formation favoring a prior injury. Atherosclerotic vascular calcifications. Sacrum is obscured by overlying bowel.  Lower lumbar degenerative changes.  IMPRESSION:  Bilateral inferior pubic rami fractures with callus formation favoring a prior injury.   Original Report Authenticated By:  Waneta Martins, M.D.    Ct Chest Wo Contrast  07/06/2012  *RADIOLOGY REPORT*  Clinical Data: Cough.  Evaluate for sternal fracture.  CT CHEST WITHOUT CONTRAST  Technique:  Multidetector CT imaging of the chest was performed following the standard protocol without IV contrast.  Comparison: Chest radiograph 07/06/2012.  Findings: No pathologically enlarged mediastinal or axillary lymph nodes.  Hilar regions are difficult to definitively evaluate without IV contrast.  Ascending aorta measures 4.1 cm.  Heart size normal.  Scattered coronary artery calcification.  No pericardial effusion.  Biapical pleural parenchymal scarring.  A 7 mm (6 x 8 mm) nodule is seen in the right lower lobe (image 43).  Scattered scarring and/or atelectasis in the lower lobes. Additional scattered smaller nodules are seen predominantly in the left upper lobe.  Focal mucoid impaction in the right upper lobe (image 14).  No pleural fluid.  Airway is unremarkable.  Incidental imaging of the upper abdomen shows no acute findings. No worrisome lytic or sclerotic lesions.  Degenerative changes are seen in the spine.  Several cortical irregularity is seen along the posterior margin of the inferior sternum.  Lower thoracic compression fracture is age indeterminate.  IMPRESSION:  1.  Nondisplaced fracture involving the inferior aspect of the sternum. 2.  Lower thoracic compression fracture is age indeterminate. 3.  Scattered nodular densities in the lungs. If the patient is at high risk for bronchogenic carcinoma, follow-up chest CT at 3-6 months is recommended.  If the patient is at low risk for bronchogenic carcinoma, follow-up chest CT at 6-12 months is recommended.  This recommendation follows the consensus statement: Guidelines for Management of Small Pulmonary Nodules Detected on CT Scans: A Statement from the Fleischner Society as published in Radiology 2005; 237:395-400. 4.  Ascending aortic aneurysm.   Original Report Authenticated By:  Reyes Ivan, M.D.     Microbiology: Recent Results (from the past 240 hour(s))  URINE CULTURE     Status: Normal   Collection Time   07/07/12  4:17 AM      Component Value Range Status Comment   Specimen Description URINE, RANDOM   Final    Special Requests none   Final    Culture  Setup Time 07/08/2012 14:00   Final    Colony Count >=100,000 COLONIES/ML   Final    Culture KLEBSIELLA PNEUMONIAE  Final    Report Status 07/10/2012 FINAL   Final    Organism ID, Bacteria KLEBSIELLA PNEUMONIAE   Final   MRSA PCR SCREENING     Status: Normal   Collection Time   07/07/12 10:12 PM      Component Value Range Status Comment   MRSA by PCR NEGATIVE  NEGATIVE Final      Labs: Basic Metabolic Panel:  Lab 07/11/12 1610 07/09/12 0341 07/08/12 0418 07/06/12 1210  NA 127* 131* 136 134*  K 3.6 4.3 2.5* 3.1*  CL 88* 96 98 90*  CO2 29 27 29 30   GLUCOSE 171* 164* 100* 98  BUN 21 11 11 16   CREATININE 0.54 0.50 0.57 0.66  CALCIUM 9.7 9.0 8.5 10.2  MG -- 1.7 1.7 --  PHOS -- 1.5* -- --   Liver Function Tests: No results found for this basename: AST:5,ALT:5,ALKPHOS:5,BILITOT:5,PROT:5,ALBUMIN:5 in the last 168 hours No results found for this basename: LIPASE:5,AMYLASE:5 in the last 168 hours No results found for this basename: AMMONIA:5 in the last 168 hours CBC:  Lab 07/06/12 1210  WBC 8.6  NEUTROABS --  HGB 13.1  HCT 37.5  MCV 90.8  PLT 252   Cardiac Enzymes:  Lab 07/06/12 1210  CKTOTAL --  CKMB --  CKMBINDEX --  TROPONINI <0.30   BNP: BNP (last 3 results) No results found for this basename: PROBNP:3 in the last 8760 hours CBG: No results found for this basename: GLUCAP:5 in the last 168 hours    Signed:  Fynn Adel  [facility] 07/11/2012, 12:41 PM

## 2012-07-11 NOTE — Progress Notes (Signed)
Physical Therapy Treatment Patient Details Name: Loretta Garcia MRN: [id number] DOB: 22-Jan-1920 Today's Date: 07/11/2012 Time: 4098-1191 PT Time Calculation (min): 40 min  PT Assessment / Plan / Recommendation Comments on Treatment Session  red areas over spine, RN notified; encouraged pt to  stay OOB; will benefit form SNF; Sats 98% on RA x ~77min; NT notified and O2 left off; HR 88 after activity    Follow Up Recommendations  Post acute inpatient rehab    Barriers to Discharge        Equipment Recommendations  None recommended by PT;None recommended by OT    Recommendations for Other Services    Frequency Min 3X/week   Plan Discharge plan remains appropriate;Frequency remains appropriate    Precautions / Restrictions Precautions Precautions: Sternal Precaution Comments: sternum fx Restrictions Other Position/Activity Restrictions: no lifting more than 5 lbs   Pertinent Vitals/Pain See comments    Mobility  Bed Mobility Bed Mobility: Sit to Supine Supine to Sit: 4: Min assist;HOB elevated (90*) Details for Bed Mobility Assistance: convinced pt to allow PT to guide/assist with LEs to help control pain; pt reluctantly agreed and did better as a result; requires increased time Transfers Transfers: Sit to Stand;Stand to Sit Sit to Stand: 2: Max assist;4: Min assist;From toilet;From bed Stand to Sit: 4: Min assist;3: Mod assist;To toilet;To chair/3-in-1 Details for Transfer Assistance: cues for hand placement, pt does not adhere to PT advice Ambulation/Gait Ambulation/Gait Assistance: 4: Min assist;3: Mod assist Ambulation Distance (Feet): 12 Feet (times 2) Assistive device: Rolling walker Ambulation/Gait Assistance Details: pt rests forearms on RW, walker is way too tall for pt, discussed with pt and dtr; pt finally agreed for PT to lower walker one notch; pt requires multi-modal cues for RW safety, distance from self;  Pt was taller when she got this walker per dtr Gait  Pattern: Step-to pattern Gait velocity: extremely slow General Gait Details: pt requires max motivation to walk, participate with PT    Exercises     PT Diagnosis:    PT Problem List:   PT Treatment Interventions:     PT Goals Acute Rehab PT Goals Time For Goal Achievement: 07/22/12 Potential to Achieve Goals: Good Pt will Roll Supine to Right Side: with min assist;with mod assist PT Goal: Rolling Supine to Right Side - Progress: Progressing toward goal Pt will go Supine/Side to Sit: with mod assist PT Goal: Supine/Side to Sit - Progress: Progressing toward goal Pt will go Sit to Stand: with mod assist PT Goal: Sit to Stand - Progress: Progressing toward goal Pt will Transfer Bed to Chair/Chair to Bed: with mod assist PT Transfer Goal: Bed to Chair/Chair to Bed - Progress: Progressing toward goal Pt will Ambulate: 51 - 150 feet;with supervision;with rolling walker PT Goal: Ambulate - Progress: Goal set today  Visit Information  Last PT Received On: 07/11/12 Assistance Needed: +2    Subjective Data  Subjective: it hurts! Patient Stated Goal: none stated;    Cognition  Overall Cognitive Status: Appears within functional limits for tasks assessed/performed Arousal/Alertness: Awake/alert Orientation Level: Appears intact for tasks assessed Behavior During Session: Anxious    Balance     End of Session PT - End of Session Activity Tolerance: Patient tolerated treatment well Patient left: in chair;with call bell/phone within reach;with family/visitor present Nurse Communication: Mobility status   GP     [facility] 07/11/2012, 11:59 AM

## 2012-07-12 DIAGNOSIS — E871 Hypo-osmolality and hyponatremia: Secondary | ICD-10-CM | POA: Diagnosis not present

## 2012-07-12 DIAGNOSIS — K5900 Constipation, unspecified: Secondary | ICD-10-CM | POA: Diagnosis not present

## 2012-07-12 DIAGNOSIS — I10 Essential (primary) hypertension: Secondary | ICD-10-CM | POA: Diagnosis not present

## 2012-07-15 DIAGNOSIS — M549 Dorsalgia, unspecified: Secondary | ICD-10-CM | POA: Diagnosis not present

## 2012-07-24 DIAGNOSIS — N952 Postmenopausal atrophic vaginitis: Secondary | ICD-10-CM | POA: Diagnosis not present

## 2012-07-24 DIAGNOSIS — F329 Major depressive disorder, single episode, unspecified: Secondary | ICD-10-CM | POA: Diagnosis not present

## 2012-08-09 DIAGNOSIS — M542 Cervicalgia: Secondary | ICD-10-CM | POA: Diagnosis not present

## 2012-08-09 DIAGNOSIS — I10 Essential (primary) hypertension: Secondary | ICD-10-CM | POA: Diagnosis not present

## 2012-08-09 DIAGNOSIS — R071 Chest pain on breathing: Secondary | ICD-10-CM | POA: Diagnosis not present

## 2012-08-09 DIAGNOSIS — M545 Low back pain: Secondary | ICD-10-CM | POA: Diagnosis not present

## 2012-08-09 DIAGNOSIS — M810 Age-related osteoporosis without current pathological fracture: Secondary | ICD-10-CM | POA: Diagnosis not present

## 2012-08-17 DIAGNOSIS — M549 Dorsalgia, unspecified: Secondary | ICD-10-CM | POA: Diagnosis not present

## 2012-08-17 DIAGNOSIS — I10 Essential (primary) hypertension: Secondary | ICD-10-CM | POA: Diagnosis not present

## 2012-08-17 DIAGNOSIS — F329 Major depressive disorder, single episode, unspecified: Secondary | ICD-10-CM | POA: Diagnosis not present

## 2012-08-17 DIAGNOSIS — E441 Mild protein-calorie malnutrition: Secondary | ICD-10-CM | POA: Diagnosis not present

## 2012-09-11 DIAGNOSIS — M810 Age-related osteoporosis without current pathological fracture: Secondary | ICD-10-CM | POA: Diagnosis not present

## 2012-09-11 DIAGNOSIS — I10 Essential (primary) hypertension: Secondary | ICD-10-CM | POA: Diagnosis not present

## 2012-09-11 DIAGNOSIS — F329 Major depressive disorder, single episode, unspecified: Secondary | ICD-10-CM | POA: Diagnosis not present

## 2012-09-11 DIAGNOSIS — M549 Dorsalgia, unspecified: Secondary | ICD-10-CM | POA: Diagnosis not present

## 2012-09-25 DIAGNOSIS — T1510XA Foreign body in conjunctival sac, unspecified eye, initial encounter: Secondary | ICD-10-CM | POA: Diagnosis not present

## 2012-09-25 DIAGNOSIS — H15849 Scleral ectasia, unspecified eye: Secondary | ICD-10-CM | POA: Diagnosis not present

## 2012-10-18 DIAGNOSIS — I10 Essential (primary) hypertension: Secondary | ICD-10-CM | POA: Diagnosis not present

## 2012-10-18 DIAGNOSIS — Z79899 Other long term (current) drug therapy: Secondary | ICD-10-CM | POA: Diagnosis not present

## 2012-10-18 DIAGNOSIS — M810 Age-related osteoporosis without current pathological fracture: Secondary | ICD-10-CM | POA: Diagnosis not present

## 2013-01-01 DIAGNOSIS — H15849 Scleral ectasia, unspecified eye: Secondary | ICD-10-CM | POA: Diagnosis not present

## 2013-01-01 DIAGNOSIS — T1510XA Foreign body in conjunctival sac, unspecified eye, initial encounter: Secondary | ICD-10-CM | POA: Diagnosis not present

## 2013-01-18 ENCOUNTER — Emergency Department (HOSPITAL_COMMUNITY): Payer: Medicare Other

## 2013-01-18 ENCOUNTER — Emergency Department (HOSPITAL_COMMUNITY)
Admission: EM | Admit: 2013-01-18 | Discharge: 2013-01-18 | Disposition: A | Discharge status: Skilled Nursing Facility | Payer: Medicare Other | Attending: Emergency Medicine | Admitting: Emergency Medicine

## 2013-01-18 ENCOUNTER — Encounter (HOSPITAL_COMMUNITY): Payer: Self-pay | Admitting: Emergency Medicine

## 2013-01-18 DIAGNOSIS — S42009A Fracture of unspecified part of unspecified clavicle, initial encounter for closed fracture: Secondary | ICD-10-CM | POA: Diagnosis not present

## 2013-01-18 DIAGNOSIS — Z7982 Long term (current) use of aspirin: Secondary | ICD-10-CM | POA: Insufficient documentation

## 2013-01-18 DIAGNOSIS — W19XXXA Unspecified fall, initial encounter: Secondary | ICD-10-CM

## 2013-01-18 DIAGNOSIS — Z79899 Other long term (current) drug therapy: Secondary | ICD-10-CM | POA: Insufficient documentation

## 2013-01-18 DIAGNOSIS — S42013A Anterior displaced fracture of sternal end of unspecified clavicle, initial encounter for closed fracture: Secondary | ICD-10-CM | POA: Diagnosis not present

## 2013-01-18 DIAGNOSIS — I10 Essential (primary) hypertension: Secondary | ICD-10-CM | POA: Insufficient documentation

## 2013-01-18 DIAGNOSIS — S42001A Fracture of unspecified part of right clavicle, initial encounter for closed fracture: Secondary | ICD-10-CM

## 2013-01-18 DIAGNOSIS — Y9210 Unspecified residential institution as the place of occurrence of the external cause: Secondary | ICD-10-CM | POA: Insufficient documentation

## 2013-01-18 DIAGNOSIS — M25519 Pain in unspecified shoulder: Secondary | ICD-10-CM | POA: Diagnosis not present

## 2013-01-18 DIAGNOSIS — Z87440 Personal history of urinary (tract) infections: Secondary | ICD-10-CM | POA: Diagnosis not present

## 2013-01-18 DIAGNOSIS — Z9660 Presence of unspecified orthopedic joint implant: Secondary | ICD-10-CM | POA: Diagnosis not present

## 2013-01-18 DIAGNOSIS — Z8719 Personal history of other diseases of the digestive system: Secondary | ICD-10-CM | POA: Insufficient documentation

## 2013-01-18 DIAGNOSIS — S42033A Displaced fracture of lateral end of unspecified clavicle, initial encounter for closed fracture: Secondary | ICD-10-CM | POA: Diagnosis not present

## 2013-01-18 DIAGNOSIS — Y9389 Activity, other specified: Secondary | ICD-10-CM | POA: Insufficient documentation

## 2013-01-18 DIAGNOSIS — W1809XA Striking against other object with subsequent fall, initial encounter: Secondary | ICD-10-CM | POA: Insufficient documentation

## 2013-01-18 DIAGNOSIS — M810 Age-related osteoporosis without current pathological fracture: Secondary | ICD-10-CM | POA: Insufficient documentation

## 2013-01-18 DIAGNOSIS — T148XXA Other injury of unspecified body region, initial encounter: Secondary | ICD-10-CM | POA: Diagnosis not present

## 2013-01-18 HISTORY — DX: Urinary tract infection, site not specified: N39.0

## 2013-01-18 HISTORY — DX: Hypokalemia: E87.6

## 2013-01-18 MED ORDER — HYDROCODONE-ACETAMINOPHEN 5-325 MG PO TABS: 1.0000 | ORAL_TABLET | Freq: Four times a day (QID) | ORAL | Status: DC | PRN

## 2013-01-18 NOTE — Progress Notes (Signed)
Orthopedic Tech Progress Note Patient Details:  Loretta Garcia [facility] April 27, 1920 [id number] Arm sling applied to Right UE. Tolerated well.  Ortho Devices Type of Ortho Device: Arm sling Ortho Device/Splint Location: Right Ortho Device/Splint Interventions: Application   Asia R Thompson 01/18/2013, 4:21 PM

## 2013-01-18 NOTE — ED Notes (Signed)
Pt alert and oriented x4. Respirations even and unlabored, bilateral symmetrical rise and fall of chest. Skin warm and dry. In no acute distress. Denies needs.   
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
## 2013-01-18 NOTE — ED Notes (Signed)
Ortho called 
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
## 2013-01-18 NOTE — Progress Notes (Addendum)
CSW spoke with pt at bedside who stated she lives in independent living at [facility]. Patient states she is concerned about returning home by herself since her dominant hand is affected by her right shoulder injury. CSW calling to discuss options with [name]. Pt husband currently in skilled nursing/health care center due to dementia and fall.   Catha Gosselin, LCSWA  [phone] .01/18/2013 1729pm   [name] able to take patient to healthcare/skilled nursing. CSW completed FL2 with md signature and ambulance form. Patient transportation to be provided by [facility]tar. RN informed, transprotation called.  Catha Gosselin, LCSWA  [phone] 01/18/2013 1729pm

## 2013-01-18 NOTE — ED Notes (Signed)
[id number]<NG> Expected date:<BR> Expected time:<BR> Means of arrival:<BR> Comments:<BR> Ems/

## 2013-01-18 NOTE — ED Notes (Signed)
md at bedside

## 2013-01-20 DIAGNOSIS — K5900 Constipation, unspecified: Secondary | ICD-10-CM | POA: Diagnosis not present

## 2013-01-20 DIAGNOSIS — IMO0001 Reserved for inherently not codable concepts without codable children: Secondary | ICD-10-CM | POA: Diagnosis not present

## 2013-01-20 DIAGNOSIS — I10 Essential (primary) hypertension: Secondary | ICD-10-CM | POA: Diagnosis not present

## 2013-01-21 DIAGNOSIS — M6281 Muscle weakness (generalized): Secondary | ICD-10-CM | POA: Diagnosis not present

## 2013-01-21 DIAGNOSIS — R269 Unspecified abnormalities of gait and mobility: Secondary | ICD-10-CM | POA: Diagnosis not present

## 2013-01-21 DIAGNOSIS — Z9181 History of falling: Secondary | ICD-10-CM | POA: Diagnosis not present

## 2013-01-21 DIAGNOSIS — R279 Unspecified lack of coordination: Secondary | ICD-10-CM | POA: Diagnosis not present

## 2013-01-22 DIAGNOSIS — R269 Unspecified abnormalities of gait and mobility: Secondary | ICD-10-CM | POA: Diagnosis not present

## 2013-01-22 DIAGNOSIS — Z9181 History of falling: Secondary | ICD-10-CM | POA: Diagnosis not present

## 2013-01-22 DIAGNOSIS — R279 Unspecified lack of coordination: Secondary | ICD-10-CM | POA: Diagnosis not present

## 2013-01-22 DIAGNOSIS — M6281 Muscle weakness (generalized): Secondary | ICD-10-CM | POA: Diagnosis not present

## 2013-01-23 DIAGNOSIS — R279 Unspecified lack of coordination: Secondary | ICD-10-CM | POA: Diagnosis not present

## 2013-01-23 DIAGNOSIS — Z9181 History of falling: Secondary | ICD-10-CM | POA: Diagnosis not present

## 2013-01-23 DIAGNOSIS — R269 Unspecified abnormalities of gait and mobility: Secondary | ICD-10-CM | POA: Diagnosis not present

## 2013-01-23 DIAGNOSIS — M6281 Muscle weakness (generalized): Secondary | ICD-10-CM | POA: Diagnosis not present

## 2013-01-23 DIAGNOSIS — M2010 Hallux valgus (acquired), unspecified foot: Secondary | ICD-10-CM | POA: Diagnosis not present

## 2013-01-24 DIAGNOSIS — R279 Unspecified lack of coordination: Secondary | ICD-10-CM | POA: Diagnosis not present

## 2013-01-24 DIAGNOSIS — Z9181 History of falling: Secondary | ICD-10-CM | POA: Diagnosis not present

## 2013-01-24 DIAGNOSIS — R269 Unspecified abnormalities of gait and mobility: Secondary | ICD-10-CM | POA: Diagnosis not present

## 2013-01-24 DIAGNOSIS — M6281 Muscle weakness (generalized): Secondary | ICD-10-CM | POA: Diagnosis not present

## 2013-01-25 DIAGNOSIS — M6281 Muscle weakness (generalized): Secondary | ICD-10-CM | POA: Diagnosis not present

## 2013-01-25 DIAGNOSIS — R279 Unspecified lack of coordination: Secondary | ICD-10-CM | POA: Diagnosis not present

## 2013-01-25 DIAGNOSIS — Z9181 History of falling: Secondary | ICD-10-CM | POA: Diagnosis not present

## 2013-01-25 DIAGNOSIS — R269 Unspecified abnormalities of gait and mobility: Secondary | ICD-10-CM | POA: Diagnosis not present

## 2013-01-28 DIAGNOSIS — R279 Unspecified lack of coordination: Secondary | ICD-10-CM | POA: Diagnosis not present

## 2013-01-28 DIAGNOSIS — R269 Unspecified abnormalities of gait and mobility: Secondary | ICD-10-CM | POA: Diagnosis not present

## 2013-01-28 DIAGNOSIS — M6281 Muscle weakness (generalized): Secondary | ICD-10-CM | POA: Diagnosis not present

## 2013-01-28 DIAGNOSIS — Z9181 History of falling: Secondary | ICD-10-CM | POA: Diagnosis not present

## 2013-01-29 DIAGNOSIS — R269 Unspecified abnormalities of gait and mobility: Secondary | ICD-10-CM | POA: Diagnosis not present

## 2013-01-29 DIAGNOSIS — R279 Unspecified lack of coordination: Secondary | ICD-10-CM | POA: Diagnosis not present

## 2013-01-29 DIAGNOSIS — Z9181 History of falling: Secondary | ICD-10-CM | POA: Diagnosis not present

## 2013-01-29 DIAGNOSIS — M6281 Muscle weakness (generalized): Secondary | ICD-10-CM | POA: Diagnosis not present

## 2013-01-30 DIAGNOSIS — Z9181 History of falling: Secondary | ICD-10-CM | POA: Diagnosis not present

## 2013-01-30 DIAGNOSIS — R269 Unspecified abnormalities of gait and mobility: Secondary | ICD-10-CM | POA: Diagnosis not present

## 2013-01-30 DIAGNOSIS — R279 Unspecified lack of coordination: Secondary | ICD-10-CM | POA: Diagnosis not present

## 2013-01-30 DIAGNOSIS — M6281 Muscle weakness (generalized): Secondary | ICD-10-CM | POA: Diagnosis not present

## 2013-01-31 DIAGNOSIS — R279 Unspecified lack of coordination: Secondary | ICD-10-CM | POA: Diagnosis not present

## 2013-01-31 DIAGNOSIS — R269 Unspecified abnormalities of gait and mobility: Secondary | ICD-10-CM | POA: Diagnosis not present

## 2013-01-31 DIAGNOSIS — M6281 Muscle weakness (generalized): Secondary | ICD-10-CM | POA: Diagnosis not present

## 2013-01-31 DIAGNOSIS — Z9181 History of falling: Secondary | ICD-10-CM | POA: Diagnosis not present

## 2013-02-20 DIAGNOSIS — S42009A Fracture of unspecified part of unspecified clavicle, initial encounter for closed fracture: Secondary | ICD-10-CM | POA: Diagnosis not present

## 2013-04-25 DIAGNOSIS — Z1331 Encounter for screening for depression: Secondary | ICD-10-CM | POA: Diagnosis not present

## 2013-04-25 DIAGNOSIS — M810 Age-related osteoporosis without current pathological fracture: Secondary | ICD-10-CM | POA: Diagnosis not present

## 2013-04-25 DIAGNOSIS — Z79899 Other long term (current) drug therapy: Secondary | ICD-10-CM | POA: Diagnosis not present

## 2013-04-25 DIAGNOSIS — Z7189 Other specified counseling: Secondary | ICD-10-CM | POA: Diagnosis not present

## 2013-04-25 DIAGNOSIS — I10 Essential (primary) hypertension: Secondary | ICD-10-CM | POA: Diagnosis not present

## 2013-04-25 DIAGNOSIS — Z0000 Encounter for general adult medical examination without abnormal findings: Secondary | ICD-10-CM | POA: Diagnosis not present

## 2013-04-30 DIAGNOSIS — T1510XA Foreign body in conjunctival sac, unspecified eye, initial encounter: Secondary | ICD-10-CM | POA: Diagnosis not present

## 2013-09-24 DIAGNOSIS — H33009 Unspecified retinal detachment with retinal break, unspecified eye: Secondary | ICD-10-CM | POA: Diagnosis not present

## 2013-09-24 DIAGNOSIS — T1510XA Foreign body in conjunctival sac, unspecified eye, initial encounter: Secondary | ICD-10-CM | POA: Diagnosis not present

## 2013-10-24 DIAGNOSIS — M25559 Pain in unspecified hip: Secondary | ICD-10-CM | POA: Diagnosis not present

## 2013-10-24 DIAGNOSIS — Z79899 Other long term (current) drug therapy: Secondary | ICD-10-CM | POA: Diagnosis not present

## 2013-10-24 DIAGNOSIS — I10 Essential (primary) hypertension: Secondary | ICD-10-CM | POA: Diagnosis not present

## 2013-12-12 ENCOUNTER — Other Ambulatory Visit: Payer: Self-pay | Admitting: Geriatric Medicine

## 2013-12-12 ENCOUNTER — Ambulatory Visit
Admission: RE | Admit: 2013-12-12 | Discharge: 2013-12-12 | Disposition: A | Discharge status: Home / Self Care | Payer: Medicare Other | Source: Ambulatory Visit | Attending: Geriatric Medicine | Admitting: Geriatric Medicine

## 2013-12-12 DIAGNOSIS — I10 Essential (primary) hypertension: Secondary | ICD-10-CM | POA: Diagnosis not present

## 2013-12-12 DIAGNOSIS — M546 Pain in thoracic spine: Secondary | ICD-10-CM

## 2013-12-12 DIAGNOSIS — R079 Chest pain, unspecified: Secondary | ICD-10-CM

## 2013-12-12 DIAGNOSIS — R109 Unspecified abdominal pain: Secondary | ICD-10-CM | POA: Diagnosis not present

## 2013-12-12 DIAGNOSIS — M4130 Thoracogenic scoliosis, site unspecified: Secondary | ICD-10-CM | POA: Diagnosis not present

## 2013-12-19 DIAGNOSIS — IMO0002 Reserved for concepts with insufficient information to code with codable children: Secondary | ICD-10-CM | POA: Diagnosis not present

## 2014-01-07 ENCOUNTER — Emergency Department (HOSPITAL_COMMUNITY): Payer: Medicare Other

## 2014-01-07 ENCOUNTER — Encounter (HOSPITAL_COMMUNITY): Payer: Self-pay | Admitting: Emergency Medicine

## 2014-01-07 ENCOUNTER — Emergency Department (HOSPITAL_COMMUNITY)
Admission: EM | Admit: 2014-01-07 | Discharge: 2014-01-07 | Disposition: A | Discharge status: Home / Self Care | Payer: Medicare Other | Attending: Emergency Medicine | Admitting: Emergency Medicine

## 2014-01-07 DIAGNOSIS — Z87440 Personal history of urinary (tract) infections: Secondary | ICD-10-CM | POA: Diagnosis not present

## 2014-01-07 DIAGNOSIS — W19XXXA Unspecified fall, initial encounter: Secondary | ICD-10-CM

## 2014-01-07 DIAGNOSIS — T84029A Dislocation of unspecified internal joint prosthesis, initial encounter: Secondary | ICD-10-CM | POA: Diagnosis not present

## 2014-01-07 DIAGNOSIS — Y9301 Activity, walking, marching and hiking: Secondary | ICD-10-CM | POA: Insufficient documentation

## 2014-01-07 DIAGNOSIS — T8489XA Other specified complication of internal orthopedic prosthetic devices, implants and grafts, initial encounter: Secondary | ICD-10-CM | POA: Insufficient documentation

## 2014-01-07 DIAGNOSIS — T148XXA Other injury of unspecified body region, initial encounter: Secondary | ICD-10-CM | POA: Diagnosis not present

## 2014-01-07 DIAGNOSIS — M25559 Pain in unspecified hip: Secondary | ICD-10-CM | POA: Insufficient documentation

## 2014-01-07 DIAGNOSIS — Z79899 Other long term (current) drug therapy: Secondary | ICD-10-CM | POA: Diagnosis not present

## 2014-01-07 DIAGNOSIS — Y9210 Unspecified residential institution as the place of occurrence of the external cause: Secondary | ICD-10-CM | POA: Insufficient documentation

## 2014-01-07 DIAGNOSIS — M810 Age-related osteoporosis without current pathological fracture: Secondary | ICD-10-CM | POA: Diagnosis not present

## 2014-01-07 DIAGNOSIS — Z96649 Presence of unspecified artificial hip joint: Secondary | ICD-10-CM | POA: Insufficient documentation

## 2014-01-07 DIAGNOSIS — I10 Essential (primary) hypertension: Secondary | ICD-10-CM | POA: Diagnosis not present

## 2014-01-07 DIAGNOSIS — K219 Gastro-esophageal reflux disease without esophagitis: Secondary | ICD-10-CM | POA: Diagnosis not present

## 2014-01-07 DIAGNOSIS — Z862 Personal history of diseases of the blood and blood-forming organs and certain disorders involving the immune mechanism: Secondary | ICD-10-CM | POA: Insufficient documentation

## 2014-01-07 DIAGNOSIS — R51 Headache: Secondary | ICD-10-CM | POA: Insufficient documentation

## 2014-01-07 DIAGNOSIS — S1093XA Contusion of unspecified part of neck, initial encounter: Secondary | ICD-10-CM | POA: Diagnosis not present

## 2014-01-07 DIAGNOSIS — W1809XA Striking against other object with subsequent fall, initial encounter: Secondary | ICD-10-CM | POA: Insufficient documentation

## 2014-01-07 DIAGNOSIS — Z7982 Long term (current) use of aspirin: Secondary | ICD-10-CM | POA: Insufficient documentation

## 2014-01-07 DIAGNOSIS — S0003XA Contusion of scalp, initial encounter: Secondary | ICD-10-CM | POA: Diagnosis not present

## 2014-01-07 DIAGNOSIS — Z8639 Personal history of other endocrine, nutritional and metabolic disease: Secondary | ICD-10-CM | POA: Diagnosis not present

## 2014-01-07 DIAGNOSIS — S0083XA Contusion of other part of head, initial encounter: Secondary | ICD-10-CM | POA: Diagnosis not present

## 2014-01-07 DIAGNOSIS — Y831 Surgical operation with implant of artificial internal device as the cause of abnormal reaction of the patient, or of later complication, without mention of misadventure at the time of the procedure: Secondary | ICD-10-CM | POA: Insufficient documentation

## 2014-01-07 DIAGNOSIS — S73004A Unspecified dislocation of right hip, initial encounter: Secondary | ICD-10-CM

## 2014-01-07 LAB — CBC WITH DIFFERENTIAL/PLATELET
BASOS PCT: 0 % (ref 0–1)
Basophils Absolute: 0 10*3/uL (ref 0.0–0.1)
EOS ABS: 0.1 10*3/uL (ref 0.0–0.7)
Eosinophils Relative: 1 % (ref 0–5)
HEMATOCRIT: 38.3 % (ref 36.0–46.0)
HEMOGLOBIN: 13.3 g/dL (ref 12.0–15.0)
Lymphocytes Relative: 22 % (ref 12–46)
Lymphs Abs: 1.4 10*3/uL (ref 0.7–4.0)
MCH: 31.1 pg (ref 26.0–34.0)
MCHC: 34.7 g/dL (ref 30.0–36.0)
MCV: 89.5 fL (ref 78.0–100.0)
MONO ABS: 0.6 10*3/uL (ref 0.1–1.0)
MONOS PCT: 11 % (ref 3–12)
Neutro Abs: 4 10*3/uL (ref 1.7–7.7)
Neutrophils Relative %: 66 % (ref 43–77)
Platelets: 229 10*3/uL (ref 150–400)
RBC: 4.28 MIL/uL (ref 3.87–5.11)
RDW: 13.7 % (ref 11.5–15.5)
WBC: 6.1 10*3/uL (ref 4.0–10.5)

## 2014-01-07 LAB — BASIC METABOLIC PANEL
BUN: 14 mg/dL (ref 6–23)
CALCIUM: 9.4 mg/dL (ref 8.4–10.5)
CO2: 29 mEq/L (ref 19–32)
Chloride: 89 mEq/L — ABNORMAL LOW (ref 96–112)
Creatinine, Ser: 0.48 mg/dL — ABNORMAL LOW (ref 0.50–1.10)
GFR, EST NON AFRICAN AMERICAN: 83 mL/min — AB (ref >90)
Glucose, Bld: 127 mg/dL — ABNORMAL HIGH (ref 70–99)
Potassium: 3.4 mEq/L — ABNORMAL LOW (ref 3.7–5.3)
Sodium: 130 mEq/L — ABNORMAL LOW (ref 137–147)

## 2014-01-07 MED ORDER — PROPOFOL 10 MG/ML IV BOLUS
INTRAVENOUS | Status: AC | PRN
  Administered 2014-01-07 (×3): 10 mg via INTRAVENOUS

## 2014-01-07 MED ORDER — PROPOFOL 10 MG/ML IV BOLUS
INTRAVENOUS | Status: AC
  Administered 2014-01-07: 10 mg via INTRAVENOUS
  Filled 2014-01-07: qty 20

## 2014-01-07 NOTE — Consult Note (Signed)
Reason for Consult:right hip pain Referring Physician: EDP  HPI: Loretta Garcia is an 80 y.o. female resident of [facility] with long history of bilateral hip difficulties with multiple revisions, most recently by Dr. Aluisio (2008?), fell today with c/o right hip pain and inability to bear weight. No loc.  Past Medical History  Diagnosis Date  . Hypertension   . Osteoporosis   . GERD (gastroesophageal reflux disease)   . Potassium (K) deficiency   . UTI (lower urinary tract infection)     Past Surgical History  Procedure Laterality Date  . Joint replacement      History reviewed. No pertinent family history.  Social History:  reports that she has never smoked. She does not have any smokeless tobacco history on file. She reports that she does not drink alcohol. Her drug history is not on file.  Allergies:  Allergies  Allergen Reactions  . Codeine Other (See Comments)    unknown  . Darvocet [Propoxyphene N-Acetaminophen] Other (See Comments)    unknown  . Plavix [Clopidogrel Bisulfate] Other (See Comments)    unsure    Medications: Prior to Admission:  Results for orders placed during the hospital encounter of 01/07/14 (from the past 48 hour(s))  CBC WITH DIFFERENTIAL     Status: None   Collection Time    01/07/14  2:28 PM      Result Value Ref Range   WBC 6.1  4.0 - 10.5 K/uL   RBC 4.28  3.87 - 5.11 MIL/uL   Hemoglobin 13.3  12.0 - 15.0 g/dL   HCT 38.3  36.0 - 46.0 %   MCV 89.5  78.0 - 100.0 fL   MCH 31.1  26.0 - 34.0 pg   MCHC 34.7  30.0 - 36.0 g/dL   RDW 13.7  11.5 - 15.5 %   Platelets 229  150 - 400 K/uL   Neutrophils Relative % 66  43 - 77 %   Neutro Abs 4.0  1.7 - 7.7 K/uL   Lymphocytes Relative 22  12 - 46 %   Lymphs Abs 1.4  0.7 - 4.0 K/uL   Monocytes Relative 11  3 - 12 %   Monocytes Absolute 0.6  0.1 - 1.0 K/uL   Eosinophils Relative 1  0 - 5 %   Eosinophils Absolute 0.1  0.0 - 0.7 K/uL   Basophils Relative 0  0 - 1 %   Basophils Absolute 0.0  0.0  - 0.1 K/uL  BASIC METABOLIC PANEL     Status: Abnormal   Collection Time    01/07/14  2:28 PM      Result Value Ref Range   Sodium 130 (*) 137 - 147 mEq/L   Potassium 3.4 (*) 3.7 - 5.3 mEq/L   Chloride 89 (*) 96 - 112 mEq/L   CO2 29  19 - 32 mEq/L   Glucose, Bld 127 (*) 70 - 99 mg/dL   BUN 14  6 - 23 mg/dL   Creatinine, Ser 0.48 (*) 0.50 - 1.10 mg/dL   Calcium 9.4  8.4 - 10.5 mg/dL   GFR calc non Af Amer 83 (*) >90 mL/min   GFR calc Af Amer >90  >90 mL/min   Comment: (NOTE)     The eGFR has been calculated using the CKD EPI equation.     This calculation has not been validated in all clinical situations.     eGFR's persistently <90 mL/min signify possible Chronic Kidney     Disease.      Dg Hip Complete Right  01/07/2014   CLINICAL DATA:  Fall, right hip pain  EXAM: RIGHT HIP - COMPLETE 2+ VIEW  COMPARISON:  Prior radiograph from 09/06/2010  FINDINGS: Bilateral total hip arthroplasties are in place. The femoral component of the right hip arthroplasty is dislocated superiorly relative to its acetabular component. No acute fracture identified. Left hip arthroplasty is in normal anatomic alignment.  Diffuse osteopenia noted. Severe degenerative changes present within the lower lumbar spine. No soft tissue abnormality.  IMPRESSION: 1. Superior dislocation of the right total hip arthroplasty. 2. No acute fracture identified. 3. Diffuse osteopenia.   Electronically Signed   By: Benjamin  McClintock M.D.   On: 01/07/2014 15:23   Ct Head Wo Contrast  01/07/2014   CLINICAL DATA:  Fall  EXAM: CT HEAD WITHOUT CONTRAST  TECHNIQUE: Contiguous axial images were obtained from the base of the skull through the vertex without intravenous contrast.  COMPARISON:  Prior CT from 10/02/2009  FINDINGS: Advanced age-related atrophy with chronic microvascular ischemic disease is present. Remote bilateral cerebellar infarcts noted, new as compared to most recent exam.  There is no acute intracranial hemorrhage or  infarct. No mass lesion or midline shift. Gray-white matter differentiation is well maintained. Ventricles are normal in size without evidence of hydrocephalus. No extra-axial fluid collection.  The calvarium is intact.  Postsurgical changes noted at the right orbit. Globes are otherwise unremarkable.  The paranasal sinuses and mastoid air cells are well pneumatized and free of fluid.  Small left parietal scalp contusion is present.  IMPRESSION: 1. Left parieto-occipital scalp contusion. No acute intracranial process. 2. Advanced age related atrophy with chronic microvascular ischemic disease. 3. Remote bilateral cerebellar infarcts.   Electronically Signed   By: Benjamin  McClintock M.D.   On: 01/07/2014 15:30     Vitals Temp:  [98.4 F (36.9 C)] 98.4 F (36.9 C) (03/31 1551) Pulse Rate:  [59-76] 63 (03/31 1710) Resp:  [14-18] 18 (03/31 1710) BP: (109-140)/(40-65) 119/65 mmHg (03/31 1710) SpO2:  [91 %-100 %] 99 % (03/31 1710) There is no weight on file to calculate BMI.  Physical Exam: thin wf cooperative, c/o right hip pain. RLE held slightly flexed and internally rotated. Intact to light touch, good ankle motion.     Assessment/Plan: Impression: dislocated right THA s/p previous multiple revisions Treatment: closed reduction under IV sedation in ED, post reduction xray shows concentric reduction Plan: weight bearing as tolerated right hip, knee imobilizer while in bed, continue total hip precautions, follow up with Dr. Aluisio in 2-3 weeks.  [name] M 01/07/2014, 5:13 PM        

## 2014-01-07 NOTE — ED Provider Notes (Signed)
Care from Dr. Fayrene FearingJames. 23F here with R prosthetic hip dislocation. Dr. Rennis ChrisSupple with Ortho wants to reduce in the ED. Patient alert/oriented, able to consent. Verbally consented to procedural sedation, no questions.   Procedural sedation Performed by: Dagmar HaitWALDEN, WILLIAM Klaira Pesci Consent: Verbal consent obtained. Risks and benefits: risks, benefits and alternatives were discussed Required items: required blood products, implants, devices, and special equipment available Patient identity confirmed: arm band and provided demographic data Time out: Immediately prior to procedure a "time out" was called to verify the correct patient, procedure, equipment, support staff and site/side marked as required.  Sedation type: moderate (conscious) sedation NPO time confirmed and considedered  Sedatives: PROPOFOL  Physician Time at Bedside: 15 minutes  Vitals: Vital signs were monitored during sedation. Cardiac Monitor, pulse oximeter Patient tolerance: Patient tolerated the procedure well with no immediate complications. Comments: Pt with uneventful recovered. Returned to pre-procedural sedation baseline  Hip reduced by Dr. Rennis ChrisSupple after 40 mg of propofol. Placed in knee immobilizer.  1. Hip dislocation, right   2. Maureen ChattersFall         William Yvette Loveless, MD 01/07/14 [id number]

## 2014-01-07 NOTE — ED Provider Notes (Signed)
CSN: [id number]     Arrival date & time 01/07/14  1416 History   First MD Initiated Contact with Patient 01/07/14 1427     Chief Complaint  Patient presents with  . Fall     (Consider location/radiation/quality/duration/timing/severity/associated sxs/prior Treatment) HPI Comments: Patient is a 80 year old female with a past medical history of hypertension and osteoporosis who presents after a fall at the [facility]. Patient reports a mechanical fall while walking and fell on the floor. Patient reports hitting her head and landing on her right hip. She denies LOC. She reports right hip pain after the fall. The pain is aching and severe. Movement at the right hip joint makes the pain worse. No alleviating factors. Patient denies any other injuries.   Patient is a 80 y.o. female presenting with fall.  Fall Associated symptoms include arthralgias and headaches. Pertinent negatives include no abdominal pain, chest pain, chills, fatigue, fever, nausea, neck pain, vomiting or weakness.    Past Medical History  Diagnosis Date  . Hypertension   . Osteoporosis   . GERD (gastroesophageal reflux disease)   . Potassium (K) deficiency   . UTI (lower urinary tract infection)    Past Surgical History  Procedure Laterality Date  . Joint replacement     History reviewed. No pertinent family history. History  Substance Use Topics  . Smoking status: Never Smoker   . Smokeless tobacco: Not on file  . Alcohol Use: No   OB History   Grav Para Term Preterm Abortions TAB SAB Ect Mult Living                 Review of Systems  Constitutional: Negative for fever, chills and fatigue.  HENT: Negative for trouble swallowing.   Eyes: Negative for visual disturbance.  Respiratory: Negative for shortness of breath.   Cardiovascular: Negative for chest pain and palpitations.  Gastrointestinal: Negative for nausea, vomiting, abdominal pain and diarrhea.  Genitourinary: Negative for dysuria and  difficulty urinating.  Musculoskeletal: Positive for arthralgias. Negative for neck pain.  Skin: Negative for color change.  Neurological: Positive for headaches. Negative for dizziness and weakness.  Psychiatric/Behavioral: Negative for dysphoric mood.      Allergies  Codeine; Darvocet; and Plavix  Home Medications   Current Outpatient Rx  Name  Route  Sig  Dispense  Refill  . aspirin EC 81 MG tablet   Oral   Take 81 mg by mouth daily.         . calcium carbonate (OS-CAL - DOSED IN MG OF ELEMENTAL CALCIUM) 1250 MG tablet   Oral   Take 1 tablet by mouth daily.         . Cranberry 1000 MG CAPS   Oral   Take 2,500 mg by mouth daily.         Marland Kitchen HYDROcodone-acetaminophen (NORCO/VICODIN) 5-325 MG per tablet   Oral   Take 1 tablet by mouth every 6 (six) hours as needed for pain.   10 tablet   0   . Multiple Vitamin (MULTIVITAMIN WITH MINERALS) TABS   Oral   Take 1 tablet by mouth daily.         Marland Kitchen OVER THE COUNTER MEDICATION   Oral   Take 1 tablet by mouth daily. Hair, eye, and nail vit.         . vitamin C (ASCORBIC ACID) 500 MG tablet   Oral   Take 500 mg by mouth daily.  BP 122/60  Pulse 68  Temp(Src) 98.4 F (36.9 C) (Oral)  Resp 14  SpO2 99% Physical Exam  Nursing note and vitals reviewed. Constitutional: She is oriented to person, place, and time. She appears well-developed and well-nourished. No distress.  HENT:  Head: Normocephalic and atraumatic.  Eyes: Conjunctivae and EOM are normal.  Neck: Normal range of motion.  Cardiovascular: Normal rate, regular rhythm and intact distal pulses.  Exam reveals no gallop and no friction rub.   No murmur heard. Pulmonary/Chest: Effort normal and breath sounds normal. She has no wheezes. She has no rales. She exhibits no tenderness.  Abdominal: Soft. She exhibits no distension. There is no tenderness. There is no rebound and no guarding.  Musculoskeletal:  Limited ROM of right hip due to pain.  Anterior and lateral right hip tenderness to palpation. No obvious deformity. Mild anterior hip bruising. Inward rotation and shortening of right leg   Neurological: She is alert and oriented to person, place, and time. Coordination normal.  Sensation intact of distal right leg. Speech is goal-oriented. Moves limbs without ataxia.   Skin: Skin is warm and dry.  Psychiatric: She has a normal mood and affect. Her behavior is normal.    ED Course  Procedures (including critical care time) Labs Review Labs Reviewed  BASIC METABOLIC PANEL - Abnormal; Notable for the following:    Sodium 130 (*)    Potassium 3.4 (*)    Chloride 89 (*)    Glucose, Bld 127 (*)    Creatinine, Ser 0.48 (*)    GFR calc non Af Amer 83 (*)    All other components within normal limits  CBC WITH DIFFERENTIAL   Imaging Review Dg Hip Complete Right  01/07/2014   CLINICAL DATA:  Fall, right hip pain  EXAM: RIGHT HIP - COMPLETE 2+ VIEW  COMPARISON:  Prior radiograph from 09/06/2010  FINDINGS: Bilateral total hip arthroplasties are in place. The femoral component of the right hip arthroplasty is dislocated superiorly relative to its acetabular component. No acute fracture identified. Left hip arthroplasty is in normal anatomic alignment.  Diffuse osteopenia noted. Severe degenerative changes present within the lower lumbar spine. No soft tissue abnormality.  IMPRESSION: 1. Superior dislocation of the right total hip arthroplasty. 2. No acute fracture identified. 3. Diffuse osteopenia.   Electronically Signed   By: Rise Mu M.D.   On: 01/07/2014 15:23   Ct Head Wo Contrast  01/07/2014   CLINICAL DATA:  Fall  EXAM: CT HEAD WITHOUT CONTRAST  TECHNIQUE: Contiguous axial images were obtained from the base of the skull through the vertex without intravenous contrast.  COMPARISON:  Prior CT from 10/02/2009  FINDINGS: Advanced age-related atrophy with chronic microvascular ischemic disease is present. Remote bilateral  cerebellar infarcts noted, new as compared to most recent exam.  There is no acute intracranial hemorrhage or infarct. No mass lesion or midline shift. Gray-white matter differentiation is well maintained. Ventricles are normal in size without evidence of hydrocephalus. No extra-axial fluid collection.  The calvarium is intact.  Postsurgical changes noted at the right orbit. Globes are otherwise unremarkable.  The paranasal sinuses and mastoid air cells are well pneumatized and free of fluid.  Small left parietal scalp contusion is present.  IMPRESSION: 1. Left parieto-occipital scalp contusion. No acute intracranial process. 2. Advanced age related atrophy with chronic microvascular ischemic disease. 3. Remote bilateral cerebellar infarcts.   Electronically Signed   By: Rise Mu M.D.   On: 01/07/2014 15:30  EKG Interpretation None      MDM   Final diagnoses:  Hip dislocation, right  Fall    4:20 PM Patient has a dislocated right hip hardware. No neurovascular compromise. Labs unremarkable unremarkable for acute changes. I spoke with Dr. Rennis ChrisSupple who will come to the ED to reduce the patient's hip.   4:45 PM Patient signed out to Dr. Gwendolyn GrantWalden.    Emilia BeckKaitlyn Teletha Petrea, PA-C 01/07/14 1646

## 2014-01-07 NOTE — ED Notes (Signed)
Patient daughter called to pick up patient-

## 2014-01-07 NOTE — ED Notes (Addendum)
Closed reduction of right hip displacement completed by Dr. Rennis ChrisSupple and Dr. Gwendolyn GrantWalden. With this RN documenting and Armed forces technical officerJenna RN tasking. Pt. Placed on 2L of O2 [place], alert and oriented x 4 at baseline. Pt given 40mg  of propofol IV in 4 10mg  IV push doses by Dr. Gwendolyn GrantWalden. Pt vital signs remained WNL- Pt placed on blow O2 by NRB to maintain O2 saturation. Pt now has returned to baseline mental status and re-oriented to situation. Pt sts "oh my hip feels so much better." portable Xray at bedside and right knee immobilizer placed by ortho.

## 2014-01-07 NOTE — ED Notes (Signed)
[facility] per report pt was walking & fell onto floor, hitting head, -LOC, L posterior hematoma, pupils equal & reactive in route, A&O x5, follows commands, speaks in complete sentences, unknown current blood thinners, pt has shortening & inward rotation to R leg, pt received 50 mcg Fentanyl in route

## 2014-01-07 NOTE — ED Notes (Signed)
pt wheeled to [facility] and nurse first informed.

## 2014-01-07 NOTE — ED Notes (Signed)
Notified [facility] for transport back home 

## 2014-01-07 NOTE — Progress Notes (Signed)
Orthopedic Tech Progress Note Patient Details:  Jonathon JordanJane W [facility] May 02, 1920 [id number]  Ortho Devices Type of Ortho Device: Knee Immobilizer Ortho Device/Splint Location: knee immobilizer for hip fx. Ortho Device/Splint Interventions: Application   Cammer, Mickie BailJennifer Carol 01/07/2014, 5:19 PM

## 2014-01-07 NOTE — Discharge Instructions (Signed)
Please bear weight on your hip as tolerated and keep your knee immobilizer on while in the bed.  Hip Dislocation Hip dislocation is the displacement of the "ball" at the head of your thigh bone (femur) from its socket in the hip bone (pelvis). The ball-and-socket structure of the hip joint gives it a lot of stability, while allowing it to move freely. Therefore, a lot of force is required to displace the femur from its socket. A hip dislocation is an emergency. If you believe you have dislocated your hip and cannot move your leg, call for help immediately. Do not try to move. CAUSES The most common cause of hip dislocation is motor vehicle accidents. However, force from falls from a height (a ladder or building), injuries from contact sports, or injuries from industrial accidents can be enough to dislocate your hip. SYMPTOMS A hip dislocation is very painful. If you have a dislocated hip, you will not be able to move your hip. If you have nerve damage, you may not have feeling in your lower leg, foot, or ankle.  DIAGNOSIS Usually, your caregiver can diagnose a hip dislocation by looking at the position of your leg. Generally, X-ray exams are done to check for fractures in your femur or pelvis. The leg of the dislocated hip will appear shorter than the other leg, and your foot will be turned inward. TREATMENT  Your caregiver can manipulate your bones back into the joint (reduction). If there are no other complications involved with your dislocation, such as fractures or damage to blood vessels or nerves, this procedure can be done without surgery. Before this procedure, you will be given medicine so that you will not feel pain (anesthetic). Often specialized imaging exams are done after the reduction (magnetic resonance imaging [MRI] or computed tomography [CT]) to check for loose pieces of cartilage or bone in the joint. If a manual reduction fails or you have nerve damage, damage to your blood vessels,  or bone fractures, surgery will be necessary to perform the reduction.  HOME CARE INSTRUCTIONS The following measures can help to reduce pain and speed up the healing process:  Rest your injured joint. Do not move your joint if it is painful. Also, avoid activities similar to the one that caused your injury.  Apply ice to your injured joint for 1 to 2 days after your reduction or as directed by your caregiver. Applying ice helps to reduce inflammation and pain.  Put ice in a plastic bag.  Place a towel between your skin and the bag.  Leave the ice on for 15 to 20 minutes at a time, every 2 hours while you are awake.  Use crutches or a walker as directed by your caregiver.  Exercise your hip and leg as directed by your caregiver.  Take over-the-counter or prescription medicine for pain as directed by your caregiver. SEEK IMMEDIATE MEDICAL CARE IF:  Your pain becomes worse rather than better.  You feel like your hip has become dislocated again. MAKE SURE YOU:  Understand these instructions.  Will watch your condition.  Will get help right away if you are not doing well or get worse. Document Released: 06/21/2001 Document Revised: 12/19/2011 Document Reviewed: 02/24/2011 [facility] Patient Information 2014 [place], [place].

## 2014-01-08 ENCOUNTER — Emergency Department (HOSPITAL_COMMUNITY): Payer: Medicare Other

## 2014-01-08 ENCOUNTER — Emergency Department (HOSPITAL_COMMUNITY)
Admission: EM | Admit: 2014-01-08 | Discharge: 2014-01-08 | Disposition: A | Discharge status: Home / Self Care | Payer: Medicare Other | Attending: Emergency Medicine | Admitting: Emergency Medicine

## 2014-01-08 DIAGNOSIS — Z862 Personal history of diseases of the blood and blood-forming organs and certain disorders involving the immune mechanism: Secondary | ICD-10-CM | POA: Diagnosis not present

## 2014-01-08 DIAGNOSIS — Z7982 Long term (current) use of aspirin: Secondary | ICD-10-CM | POA: Insufficient documentation

## 2014-01-08 DIAGNOSIS — I10 Essential (primary) hypertension: Secondary | ICD-10-CM | POA: Diagnosis not present

## 2014-01-08 DIAGNOSIS — Z8739 Personal history of other diseases of the musculoskeletal system and connective tissue: Secondary | ICD-10-CM | POA: Insufficient documentation

## 2014-01-08 DIAGNOSIS — Z792 Long term (current) use of antibiotics: Secondary | ICD-10-CM | POA: Insufficient documentation

## 2014-01-08 DIAGNOSIS — Z87440 Personal history of urinary (tract) infections: Secondary | ICD-10-CM | POA: Diagnosis not present

## 2014-01-08 DIAGNOSIS — K219 Gastro-esophageal reflux disease without esophagitis: Secondary | ICD-10-CM | POA: Insufficient documentation

## 2014-01-08 DIAGNOSIS — M25559 Pain in unspecified hip: Secondary | ICD-10-CM | POA: Diagnosis not present

## 2014-01-08 DIAGNOSIS — Z79899 Other long term (current) drug therapy: Secondary | ICD-10-CM | POA: Diagnosis not present

## 2014-01-08 DIAGNOSIS — Y939 Activity, unspecified: Secondary | ICD-10-CM | POA: Insufficient documentation

## 2014-01-08 DIAGNOSIS — X58XXXA Exposure to other specified factors, initial encounter: Secondary | ICD-10-CM | POA: Insufficient documentation

## 2014-01-08 DIAGNOSIS — T84029A Dislocation of unspecified internal joint prosthesis, initial encounter: Secondary | ICD-10-CM | POA: Diagnosis not present

## 2014-01-08 DIAGNOSIS — S73004A Unspecified dislocation of right hip, initial encounter: Secondary | ICD-10-CM

## 2014-01-08 DIAGNOSIS — S73006A Unspecified dislocation of unspecified hip, initial encounter: Secondary | ICD-10-CM | POA: Insufficient documentation

## 2014-01-08 DIAGNOSIS — Y929 Unspecified place or not applicable: Secondary | ICD-10-CM | POA: Insufficient documentation

## 2014-01-08 DIAGNOSIS — S79919A Unspecified injury of unspecified hip, initial encounter: Secondary | ICD-10-CM | POA: Diagnosis not present

## 2014-01-08 DIAGNOSIS — Z8639 Personal history of other endocrine, nutritional and metabolic disease: Secondary | ICD-10-CM | POA: Insufficient documentation

## 2014-01-08 DIAGNOSIS — Z96649 Presence of unspecified artificial hip joint: Secondary | ICD-10-CM | POA: Diagnosis not present

## 2014-01-08 MED ORDER — KETAMINE HCL 10 MG/ML IJ SOLN
0.5000 mg/kg | Freq: Once | INTRAMUSCULAR | Status: AC
  Administered 2014-01-08: 25 mg via INTRAVENOUS

## 2014-01-08 MED ORDER — FENTANYL CITRATE 0.05 MG/ML IJ SOLN
50.0000 ug | Freq: Once | INTRAMUSCULAR | Status: AC
  Administered 2014-01-08: 50 ug via INTRAVENOUS
  Filled 2014-01-08: qty 2

## 2014-01-08 MED ORDER — PROPOFOL 10 MG/ML IV BOLUS
0.5000 mg/kg | Freq: Once | INTRAVENOUS | Status: AC
  Administered 2014-01-08: 25 mg via INTRAVENOUS
  Filled 2014-01-08: qty 20

## 2014-01-08 MED ORDER — TRAMADOL HCL 50 MG PO TABS: 50.0000 mg | ORAL_TABLET | Freq: Four times a day (QID) | ORAL | Status: DC | PRN

## 2014-01-08 NOTE — ED Provider Notes (Signed)
Procedural sedation Date/Time: 01/08/2014 2:00 PM Performed by: Rolland PorterJAMES, Mohammedali Bedoy Authorized by: Rolland PorterJAMES, Waynette Towers Consent: Verbal consent obtained. written consent obtained. Risks and benefits: risks, benefits and alternatives were discussed Consent given by: patient Patient understanding: patient states understanding of the procedure being performed Patient consent: the patient's understanding of the procedure matches consent given Procedure consent: procedure consent matches procedure scheduled Relevant documents: relevant documents present and verified Patient identity confirmed: verbally with patient and arm band Time out: Immediately prior to procedure a "time out" was called to verify the correct patient, procedure, equipment, support staff and site/side marked as required. Patient sedated: yes Sedation type: moderate (conscious) sedation Sedatives: propofol and ketamine Sedation start date/time: 01/08/2014 2:00 PM Sedation end date/time: 01/08/2014 2:30 PM Vitals: Vital signs were monitored during sedation. Patient tolerance: Patient tolerated the procedure well with no immediate complications.     Rolland PorterMark Faraz Ponciano, MD 01/08/14 1505

## 2014-01-08 NOTE — ED Notes (Signed)
Pt at discharge stated she was looking for her shoes. Explained to patient she did not come in wearing shoes. Room searched for patients shoes. Not found. Explained to patient and family they can make a report with security if unable to find her shoes once home. They verbalized understanding and agreeable with plan

## 2014-01-08 NOTE — ED Provider Notes (Signed)
CSN: [id number]     Arrival date & time 01/08/14  4098 History   First MD Initiated Contact with Patient 01/08/14 [phone]     No chief complaint on file.    (Consider location/radiation/quality/duration/timing/severity/associated sxs/prior Treatment) HPI Pleasant 80 y/o femalw presents w/ c hip dislocation. Patient was seen here less thant 24 hours ago for dislocation of the same R hip. She has a history of multiple revision surgeries and previous dislocations. Patient was reduced by Dr. Rennis Chris here in the ED last night, placed in knee immobilizer and hip binder. She states that she woke up this morning and it was out again. Denies numbness, tingling. Pain is 8/10.  Worse with pain, relieved somewhat with lying still. No meds at [facility] for pain.  Past Medical History  Diagnosis Date  . Hypertension   . Osteoporosis   . GERD (gastroesophageal reflux disease)   . Potassium (K) deficiency   . UTI (lower urinary tract infection)    Past Surgical History  Procedure Laterality Date  . Joint replacement     No family history on file. History  Substance Use Topics  . Smoking status: Never Smoker   . Smokeless tobacco: Not on file  . Alcohol Use: No   OB History   Grav Para Term Preterm Abortions TAB SAB Ect Mult Living                 Review of Systems  Ten systems reviewed and are negative for acute change, except as noted in the HPI.     Allergies  Codeine; Darvocet; and Plavix  Home Medications   Current Outpatient Rx  Name  Route  Sig  Dispense  Refill  . APPLE CIDER VINEGAR PO   Oral   Take 5 mLs by mouth every morning.         Marland Kitchen aspirin EC 81 MG tablet   Oral   Take 81 mg by mouth daily.         Marland Kitchen BIOTIN PO   Oral   Take 1 tablet by mouth daily.         . calcitonin, salmon, (MIACALCIN/FORTICAL) 200 UNIT/ACT nasal spray   Alternating Nares   Place 1 spray into alternate nostrils at bedtime.         . calcium carbonate (OS-CAL - DOSED IN MG OF  ELEMENTAL CALCIUM) 1250 MG tablet   Oral   Take 1 tablet by mouth daily.         . Cranberry 1000 MG CAPS   Oral   Take 2,000 mg by mouth daily.          . Cyanocobalamin (B-12 PO)   Oral   Take 1 tablet by mouth daily.         . indapamide (LOZOL) 1.25 MG tablet   Oral   Take 1.25 mg by mouth every morning.         . Multiple Vitamin (MULTIVITAMIN WITH MINERALS) TABS   Oral   Take 1 tablet by mouth daily.         Marland Kitchen ofloxacin (OCUFLOX) 0.3 % ophthalmic solution   Both Eyes   Place 1 drop into both eyes 2 (two) times daily.         Marland Kitchen OVER THE COUNTER MEDICATION   Oral   Take 1 tablet by mouth daily. Hair, eye, and nail vit.         Marland Kitchen Potassium Chloride CR (MICRO-K) 8 MEQ CPCR capsule CR  Oral   Take 1 capsule by mouth every morning.         . sertraline (ZOLOFT) 50 MG tablet   Oral   Take 50 mg by mouth daily.         . vitamin C (ASCORBIC ACID) 500 MG tablet   Oral   Take 500 mg by mouth daily.          There were no vitals taken for this visit. Physical Exam Physical Exam  Nursing note and vitals reviewed. Constitutional: She is oriented to person, place, and time. She appears well-developed and well-nourished. No distress.  HENT:  Head: Normocephalic and atraumatic.  Eyes: Conjunctivae normal and EOM are normal. Pupils are equal, round, and reactive to light. No scleral icterus.  Neck: Normal range of motion.  Cardiovascular: Normal rate, regular rhythm and normal heart sounds.  Exam reveals no gallop and no friction rub.   No murmur heard. Pulmonary/Chest: Effort normal and breath sounds normal. No respiratory distress.  Abdominal: Soft. Bowel sounds are normal. She exhibits no distension and no mass. There is no tenderness. There is no guarding.  Musculoskeletal: R hip deformity with Marked shortening and internal rotation of the R foot. NV intact. Neurological: She is alert and oriented to person, place, and time.  Skin: Skin is warm  and dry. She is not diaphoretic.      ED Course  Reduction of dislocation Date/Time: 01/08/2014 2:00 PM Performed by: Arthor CaptainHARRIS, Yania Bogie Authorized by: Arthor CaptainHARRIS, Christapher Gillian Consent: Verbal consent obtained. Risks and benefits: risks, benefits and alternatives were discussed Patient identity confirmed: verbally with patient Time out: Immediately prior to procedure a "time out" was called to verify the correct patient, procedure, equipment, support staff and site/side marked as required. Patient sedated: yes Sedatives: propofol and ketamine Analgesia: fentanyl Sedation start date/time: 01/08/2014 2:00 PM Sedation end date/time: 01/08/2014 2:30 PM Patient tolerance: Patient tolerated the procedure well with no immediate complications.   (including critical care time) Labs Review Labs Reviewed - No data to display Imaging Review Dg Hip Complete Right  01/08/2014   CLINICAL DATA:  Hip pain.  EXAM: RIGHT HIP - COMPLETE 2+ VIEW  COMPARISON:  DG HIP COMPLETE*R* dated 01/07/2014  FINDINGS: Continued superior dislocation of the femoral component, right total hip arthroplasty. No acute fracture. Unremarkable appearing left hip arthroplasty. Osteopenia.  IMPRESSION: There has been no interval reduction in the superiorly dislocated right THR.   Electronically Signed   By: Davonna BellingJohn  Curnes M.D.   On: 01/08/2014 10:12   Dg Hip Complete Right  01/07/2014   CLINICAL DATA:  Fall, right hip pain  EXAM: RIGHT HIP - COMPLETE 2+ VIEW  COMPARISON:  Prior radiograph from 09/06/2010  FINDINGS: Bilateral total hip arthroplasties are in place. The femoral component of the right hip arthroplasty is dislocated superiorly relative to its acetabular component. No acute fracture identified. Left hip arthroplasty is in normal anatomic alignment.  Diffuse osteopenia noted. Severe degenerative changes present within the lower lumbar spine. No soft tissue abnormality.  IMPRESSION: 1. Superior dislocation of the right total hip arthroplasty. 2.  No acute fracture identified. 3. Diffuse osteopenia.   Electronically Signed   By: Rise MuBenjamin  McClintock M.D.   On: 01/07/2014 15:23   Ct Head Wo Contrast  01/07/2014   CLINICAL DATA:  Fall  EXAM: CT HEAD WITHOUT CONTRAST  TECHNIQUE: Contiguous axial images were obtained from the base of the skull through the vertex without intravenous contrast.  COMPARISON:  Prior CT from 10/02/2009  FINDINGS:  Advanced age-related atrophy with chronic microvascular ischemic disease is present. Remote bilateral cerebellar infarcts noted, new as compared to most recent exam.  There is no acute intracranial hemorrhage or infarct. No mass lesion or midline shift. Gray-white matter differentiation is well maintained. Ventricles are normal in size without evidence of hydrocephalus. No extra-axial fluid collection.  The calvarium is intact.  Postsurgical changes noted at the right orbit. Globes are otherwise unremarkable.  The paranasal sinuses and mastoid air cells are well pneumatized and free of fluid.  Small left parietal scalp contusion is present.  IMPRESSION: 1. Left parieto-occipital scalp contusion. No acute intracranial process. 2. Advanced age related atrophy with chronic microvascular ischemic disease. 3. Remote bilateral cerebellar infarcts.   Electronically Signed   By: Rise Mu M.D.   On: 01/07/2014 15:30   Dg Pelvis Portable  01/07/2014   CLINICAL DATA:  Post reduction right hip  EXAM: PORTABLE PELVIS 1-2 VIEWS  COMPARISON:  01/07/2014  FINDINGS: Portable AP pelvis performed. There is improved alignment of the right femoral prosthetic component in relation to the acetabular cup in the frontal projection. Postop changes of both hips noted. Bones are osteopenic. Degenerative changes of the lower lumbar spine and pelvis. Nonobstructive bowel gas pattern.  IMPRESSION: Reduced right hip prosthesis with improved alignment.   Electronically Signed   By: Ruel Favors M.D.   On: 01/07/2014 17:42   Dg Hip  Portable 1 View Right  01/08/2014   CLINICAL DATA:  Post reduction.  EXAM: PORTABLE RIGHT HIP - 1 VIEW  COMPARISON:  DG HIP COMPLETE*R* dated 01/08/2014  FINDINGS: Interval reduction of superiorly dislocated right hip: AP portable view. Patient has had total right hip replacement. No acute bony abnormality identified.  IMPRESSION: Interval relocation of right hip on AP portable view.   Electronically Signed   By: Maisie Fus  Register   On: 01/08/2014 13:53     EKG Interpretation None      MDM   Final diagnoses:  Hip dislocation, right    Patient with prosthetic hope dislocation. i spoke with Dr. Victorino Dike who is on call for [facility] where she had her last revision by dr. Despina Hick in 2008. Dr. Victorino Dike states that both of the practices hp specialists are out of town. He asks that we reduce her an place her in an abduction orthosis.  Patient hip reduced easily. Tolerated the procedure well. Placed in [employer] orthosis. Pain medication given. Patient is to follow up this week withDr. Despina Hick this week for evaluation.    Arthor Captain, PA-C 01/09/14 1428  Arthor Captain, PA-C 01/09/14 1431

## 2014-01-08 NOTE — ED Notes (Signed)
Patient placed on bedpan.

## 2014-01-08 NOTE — Sedation Documentation (Signed)
Pt became apneic after sedation administered, pt bagged, jaw thrust maneuver provided with improvement in O2 saturation

## 2014-01-08 NOTE — ED Notes (Signed)
Pt arrives via EMS from home with right hip pain and deformity. Was here yesterday with same. Hip was dislocated. Pt awake, alert, oriented x4.

## 2014-01-08 NOTE — Discharge Instructions (Signed)
Hip Dislocation  Hip dislocation is the displacement of the "ball" at the head of your thigh bone (femur) from its socket in the hip bone (pelvis). The ball-and-socket structure of the hip joint gives it a lot of stability, while allowing it to move freely. Therefore, a lot of force is required to displace the femur from its socket. A hip dislocation is an emergency. If you believe you have dislocated your hip and cannot move your leg, call for help immediately. Do not try to move.  CAUSES  The most common cause of hip dislocation is motor vehicle accidents. However, force from falls from a height (a ladder or building), injuries from contact sports, or injuries from industrial accidents can be enough to dislocate your hip.  SYMPTOMS  A hip dislocation is very painful. If you have a dislocated hip, you will not be able to move your hip. If you have nerve damage, you may not have feeling in your lower leg, foot, or ankle.   DIAGNOSIS  Usually, your caregiver can diagnose a hip dislocation by looking at the position of your leg. Generally, X-ray exams are done to check for fractures in your femur or pelvis. The leg of the dislocated hip will appear shorter than the other leg, and your foot will be turned inward.  TREATMENT   Your caregiver can manipulate your bones back into the joint (reduction). If there are no other complications involved with your dislocation, such as fractures or damage to blood vessels or nerves, this procedure can be done without surgery. Before this procedure, you will be given medicine so that you will not feel pain (anesthetic). Often specialized imaging exams are done after the reduction (magnetic resonance imaging [MRI] or computed tomography [CT]) to check for loose pieces of cartilage or bone in the joint.  If a manual reduction fails or you have nerve damage, damage to your blood vessels, or bone fractures, surgery will be necessary to perform the reduction.   HOME CARE  INSTRUCTIONS  The following measures can help to reduce pain and speed up the healing process:  · Rest your injured joint. Do not move your joint if it is painful. Also, avoid activities similar to the one that caused your injury.  · Apply ice to your injured joint for 1 to 2 days after your reduction or as directed by your caregiver. Applying ice helps to reduce inflammation and pain.  · Put ice in a plastic bag.  · Place a towel between your skin and the bag.  · Leave the ice on for 15 to 20 minutes at a time, every 2 hours while you are awake.  · Use crutches or a walker as directed by your caregiver.  · Exercise your hip and leg as directed by your caregiver.  · Take over-the-counter or prescription medicine for pain as directed by your caregiver.  SEEK IMMEDIATE MEDICAL CARE IF:  · Your pain becomes worse rather than better.  · You feel like your hip has become dislocated again.  MAKE SURE YOU:  · Understand these instructions.  · Will watch your condition.  · Will get help right away if you are not doing well or get worse.  Document Released: 06/21/2001 Document Revised: 12/19/2011 Document Reviewed: 02/24/2011  Exit[employer]® Patient Information ©2014 [employer].

## 2014-01-10 NOTE — ED Provider Notes (Signed)
Medical screening examination/treatment/procedure(s) were conducted as a shared visit with non-physician practitioner(s) and myself.  I personally evaluated the patient during the encounter.   EKG Interpretation   Date/Time:  Tuesday January 07 2014 14:28:38 EDT Ventricular Rate:  76 PR Interval:  286 QRS Duration: 67 QT Interval:  402 QTC Calculation: 452 R Axis:   -86 Text Interpretation:  Sinus or ectopic atrial rhythm Prolonged PR interval  Left anterior fascicular block Anteroseptal infarct, age indeterminate ED  PHYSICIAN INTERPRETATION AVAILABLE IN [facility] Confirmed by TEST,  Record ([id number]) on 01/09/2014 3:00:37 PM      Vision seen examined. Her history was reviewed. Fall. Clinically and radiographically he has a dislocated prosthetic hip. Dr. supple was consulted by PA. He is en route planning reduction in the emergency room.  Rolland PorterMark Henleigh Robello, MD 01/10/14 1606

## 2014-01-10 NOTE — ED Provider Notes (Signed)
Medical screening examination/treatment/procedure(s) were conducted as a shared visit with non-physician practitioner(s) and myself.  I personally evaluated the patient during the encounter.   EKG Interpretation None        Rolland PorterMark Skyann Ganim, MD 01/10/14 [phone]

## 2014-01-11 DIAGNOSIS — I10 Essential (primary) hypertension: Secondary | ICD-10-CM | POA: Diagnosis not present

## 2014-01-11 DIAGNOSIS — M25559 Pain in unspecified hip: Secondary | ICD-10-CM | POA: Diagnosis not present

## 2014-01-12 DIAGNOSIS — M6281 Muscle weakness (generalized): Secondary | ICD-10-CM | POA: Diagnosis not present

## 2014-01-12 DIAGNOSIS — S73006A Unspecified dislocation of unspecified hip, initial encounter: Secondary | ICD-10-CM | POA: Diagnosis not present

## 2014-01-14 DIAGNOSIS — M6281 Muscle weakness (generalized): Secondary | ICD-10-CM | POA: Diagnosis not present

## 2014-01-14 DIAGNOSIS — S73006A Unspecified dislocation of unspecified hip, initial encounter: Secondary | ICD-10-CM | POA: Diagnosis not present

## 2014-01-16 DIAGNOSIS — S73006A Unspecified dislocation of unspecified hip, initial encounter: Secondary | ICD-10-CM | POA: Diagnosis not present

## 2014-01-16 DIAGNOSIS — M6281 Muscle weakness (generalized): Secondary | ICD-10-CM | POA: Diagnosis not present

## 2014-01-17 ENCOUNTER — Encounter (HOSPITAL_COMMUNITY): Payer: Self-pay | Admitting: Pharmacy Technician

## 2014-01-17 ENCOUNTER — Other Ambulatory Visit: Payer: Self-pay | Admitting: Surgical

## 2014-01-17 ENCOUNTER — Encounter (HOSPITAL_COMMUNITY): Payer: Self-pay | Admitting: *Deleted

## 2014-01-17 NOTE — Progress Notes (Signed)
SPOKE BY PHONE WITH PT'S DAUGHTER JANET KNIGHT - JANET WAS WITH PATIENT - MEDICAL HISTORY REVIEWED.  PREOP INSTRUCTIONS REVIEWED WITH JANET - SHE [facility]. SPOKE BY PHONE WITH ANNIE MILLER- NURSING SUPERVISOR AT [facility] - SHE RECEIVED FAXED PREOP INSTRUCTIONS AND CHLORHEXIDINE INSTRUCTIONS / PRECAUTIONS.

## 2014-01-20 ENCOUNTER — Inpatient Hospital Stay (HOSPITAL_COMMUNITY): Payer: Medicare Other | Admitting: Anesthesiology

## 2014-01-20 ENCOUNTER — Encounter (HOSPITAL_COMMUNITY)
Admission: RE | Disposition: A | Discharge status: Skilled Nursing Facility | Payer: Self-pay | Source: Ambulatory Visit | Attending: Orthopedic Surgery

## 2014-01-20 ENCOUNTER — Inpatient Hospital Stay (HOSPITAL_COMMUNITY)
Admission: RE | Admit: 2014-01-20 | Discharge: 2014-01-27 | DRG: 466 | Disposition: A | Discharge status: Skilled Nursing Facility | Payer: Medicare Other | Source: Ambulatory Visit | Attending: Orthopedic Surgery | Admitting: Orthopedic Surgery

## 2014-01-20 ENCOUNTER — Encounter (HOSPITAL_COMMUNITY): Payer: Self-pay | Admitting: *Deleted

## 2014-01-20 ENCOUNTER — Encounter (HOSPITAL_COMMUNITY): Payer: Medicare Other | Admitting: Anesthesiology

## 2014-01-20 ENCOUNTER — Inpatient Hospital Stay (HOSPITAL_COMMUNITY): Payer: Medicare Other

## 2014-01-20 DIAGNOSIS — M810 Age-related osteoporosis without current pathological fracture: Secondary | ICD-10-CM | POA: Diagnosis present

## 2014-01-20 DIAGNOSIS — Z880 Allergy status to penicillin: Secondary | ICD-10-CM

## 2014-01-20 DIAGNOSIS — E876 Hypokalemia: Secondary | ICD-10-CM | POA: Diagnosis not present

## 2014-01-20 DIAGNOSIS — F0280 Dementia in other diseases classified elsewhere without behavioral disturbance: Secondary | ICD-10-CM | POA: Diagnosis not present

## 2014-01-20 DIAGNOSIS — Z7982 Long term (current) use of aspirin: Secondary | ICD-10-CM | POA: Diagnosis not present

## 2014-01-20 DIAGNOSIS — Z5189 Encounter for other specified aftercare: Secondary | ICD-10-CM | POA: Diagnosis not present

## 2014-01-20 DIAGNOSIS — J690 Pneumonitis due to inhalation of food and vomit: Secondary | ICD-10-CM | POA: Diagnosis not present

## 2014-01-20 DIAGNOSIS — T84099A Other mechanical complication of unspecified internal joint prosthesis, initial encounter: Secondary | ICD-10-CM | POA: Diagnosis not present

## 2014-01-20 DIAGNOSIS — K208 Other esophagitis without bleeding: Secondary | ICD-10-CM | POA: Diagnosis present

## 2014-01-20 DIAGNOSIS — Z9181 History of falling: Secondary | ICD-10-CM | POA: Diagnosis not present

## 2014-01-20 DIAGNOSIS — T84029A Dislocation of unspecified internal joint prosthesis, initial encounter: Secondary | ICD-10-CM | POA: Diagnosis not present

## 2014-01-20 DIAGNOSIS — R1319 Other dysphagia: Secondary | ICD-10-CM | POA: Diagnosis present

## 2014-01-20 DIAGNOSIS — B3781 Candidal esophagitis: Secondary | ICD-10-CM | POA: Diagnosis not present

## 2014-01-20 DIAGNOSIS — S79929A Unspecified injury of unspecified thigh, initial encounter: Secondary | ICD-10-CM | POA: Diagnosis not present

## 2014-01-20 DIAGNOSIS — K219 Gastro-esophageal reflux disease without esophagitis: Secondary | ICD-10-CM | POA: Diagnosis not present

## 2014-01-20 DIAGNOSIS — Y831 Surgical operation with implant of artificial internal device as the cause of abnormal reaction of the patient, or of later complication, without mention of misadventure at the time of the procedure: Secondary | ICD-10-CM | POA: Diagnosis present

## 2014-01-20 DIAGNOSIS — B379 Candidiasis, unspecified: Secondary | ICD-10-CM | POA: Diagnosis not present

## 2014-01-20 DIAGNOSIS — D62 Acute posthemorrhagic anemia: Secondary | ICD-10-CM | POA: Diagnosis not present

## 2014-01-20 DIAGNOSIS — R6889 Other general symptoms and signs: Secondary | ICD-10-CM | POA: Diagnosis present

## 2014-01-20 DIAGNOSIS — K2210 Ulcer of esophagus without bleeding: Secondary | ICD-10-CM | POA: Diagnosis not present

## 2014-01-20 DIAGNOSIS — K228 Other specified diseases of esophagus: Secondary | ICD-10-CM | POA: Diagnosis present

## 2014-01-20 DIAGNOSIS — F0390 Unspecified dementia without behavioral disturbance: Secondary | ICD-10-CM | POA: Diagnosis present

## 2014-01-20 DIAGNOSIS — M25559 Pain in unspecified hip: Secondary | ICD-10-CM | POA: Diagnosis not present

## 2014-01-20 DIAGNOSIS — Z888 Allergy status to other drugs, medicaments and biological substances status: Secondary | ICD-10-CM

## 2014-01-20 DIAGNOSIS — S79919A Unspecified injury of unspecified hip, initial encounter: Secondary | ICD-10-CM | POA: Diagnosis not present

## 2014-01-20 DIAGNOSIS — R933 Abnormal findings on diagnostic imaging of other parts of digestive tract: Secondary | ICD-10-CM | POA: Diagnosis not present

## 2014-01-20 DIAGNOSIS — F411 Generalized anxiety disorder: Secondary | ICD-10-CM | POA: Diagnosis present

## 2014-01-20 DIAGNOSIS — Z79899 Other long term (current) drug therapy: Secondary | ICD-10-CM | POA: Diagnosis not present

## 2014-01-20 DIAGNOSIS — M259 Joint disorder, unspecified: Secondary | ICD-10-CM | POA: Diagnosis not present

## 2014-01-20 DIAGNOSIS — J180 Bronchopneumonia, unspecified organism: Secondary | ICD-10-CM | POA: Diagnosis not present

## 2014-01-20 DIAGNOSIS — M159 Polyosteoarthritis, unspecified: Secondary | ICD-10-CM | POA: Diagnosis not present

## 2014-01-20 DIAGNOSIS — Z8673 Personal history of transient ischemic attack (TIA), and cerebral infarction without residual deficits: Secondary | ICD-10-CM

## 2014-01-20 DIAGNOSIS — Z96649 Presence of unspecified artificial hip joint: Secondary | ICD-10-CM

## 2014-01-20 DIAGNOSIS — H338 Other retinal detachments: Secondary | ICD-10-CM | POA: Diagnosis not present

## 2014-01-20 DIAGNOSIS — I10 Essential (primary) hypertension: Secondary | ICD-10-CM | POA: Diagnosis not present

## 2014-01-20 DIAGNOSIS — K2289 Other specified disease of esophagus: Secondary | ICD-10-CM | POA: Diagnosis present

## 2014-01-20 DIAGNOSIS — S73006A Unspecified dislocation of unspecified hip, initial encounter: Secondary | ICD-10-CM | POA: Diagnosis not present

## 2014-01-20 DIAGNOSIS — R110 Nausea: Secondary | ICD-10-CM | POA: Diagnosis not present

## 2014-01-20 HISTORY — DX: Anxiety disorder, unspecified: F41.9

## 2014-01-20 HISTORY — DX: Unspecified dementia, unspecified severity, without behavioral disturbance, psychotic disturbance, mood disturbance, and anxiety: F03.90

## 2014-01-20 HISTORY — DX: Unspecified osteoarthritis, unspecified site: M19.90

## 2014-01-20 HISTORY — DX: Cerebral infarction, unspecified: I63.9

## 2014-01-20 HISTORY — PX: TOTAL HIP REVISION: SHX763

## 2014-01-20 HISTORY — DX: Repeated falls: R29.6

## 2014-01-20 LAB — COMPREHENSIVE METABOLIC PANEL
ALT: 13 U/L (ref 0–35)
AST: 20 U/L (ref 0–37)
Albumin: 2.9 g/dL — ABNORMAL LOW (ref 3.5–5.2)
Alkaline Phosphatase: 62 U/L (ref 39–117)
BUN: 17 mg/dL (ref 6–23)
CO2: 30 mEq/L (ref 19–32)
Calcium: 9.9 mg/dL (ref 8.4–10.5)
Chloride: 96 mEq/L (ref 96–112)
Creatinine, Ser: 0.57 mg/dL (ref 0.50–1.10)
GFR calc Af Amer: >90 mL/min (ref >90)
GFR calc non Af Amer: 79 mL/min — ABNORMAL LOW (ref >90)
Glucose, Bld: 82 mg/dL (ref 70–99)
Potassium: 3 mEq/L — ABNORMAL LOW (ref 3.7–5.3)
Sodium: 138 mEq/L (ref 137–147)
Total Bilirubin: 0.3 mg/dL (ref 0.3–1.2)
Total Protein: 6.1 g/dL (ref 6.0–8.3)

## 2014-01-20 LAB — APTT: aPTT: 26 seconds (ref 24–37)

## 2014-01-20 LAB — PROTIME-INR
INR: 0.99 (ref 0.00–1.49)
Prothrombin Time: 12.9 seconds (ref 11.6–15.2)

## 2014-01-20 LAB — TYPE AND SCREEN
ABO/RH(D): O POS
Antibody Screen: NEGATIVE

## 2014-01-20 LAB — SURGICAL PCR SCREEN
MRSA, PCR: NEGATIVE
STAPHYLOCOCCUS AUREUS: NEGATIVE

## 2014-01-20 SURGERY — TOTAL HIP REVISION
Anesthesia: Spinal | Site: Hip | Laterality: Right

## 2014-01-20 MED ORDER — FLEET ENEMA 7-19 GM/118ML RE ENEM: 1.0000 | ENEMA | Freq: Once | RECTAL | Status: AC | PRN

## 2014-01-20 MED ORDER — SODIUM CHLORIDE 0.9 % IJ SOLN
INTRAMUSCULAR | Status: AC
  Filled 2014-01-20: qty 50

## 2014-01-20 MED ORDER — MENTHOL 3 MG MT LOZG
1.0000 | LOZENGE | OROMUCOSAL | Status: DC | PRN
  Filled 2014-01-20: qty 9

## 2014-01-20 MED ORDER — MUPIROCIN 2 % EX OINT
TOPICAL_OINTMENT | Freq: Two times a day (BID) | CUTANEOUS | Status: DC
  Administered 2014-01-20: 1 via NASAL
  Filled 2014-01-20: qty 22

## 2014-01-20 MED ORDER — VANCOMYCIN HCL IN DEXTROSE 1-5 GM/200ML-% IV SOLN
1000.0000 mg | Freq: Two times a day (BID) | INTRAVENOUS | Status: AC
  Administered 2014-01-21: 1000 mg via INTRAVENOUS
  Filled 2014-01-20: qty 200

## 2014-01-20 MED ORDER — ACETAMINOPHEN 325 MG PO TABS
650.0000 mg | ORAL_TABLET | Freq: Four times a day (QID) | ORAL | Status: DC | PRN
  Administered 2014-01-22 – 2014-01-26 (×2): 325 mg via ORAL
  Filled 2014-01-20 (×3): qty 2

## 2014-01-20 MED ORDER — ONDANSETRON HCL 4 MG/2ML IJ SOLN
4.0000 mg | Freq: Four times a day (QID) | INTRAMUSCULAR | Status: DC | PRN
  Administered 2014-01-23: 4 mg via INTRAVENOUS
  Filled 2014-01-20: qty 2

## 2014-01-20 MED ORDER — FENTANYL CITRATE 0.05 MG/ML IJ SOLN
INTRAMUSCULAR | Status: AC
  Filled 2014-01-20: qty 2

## 2014-01-20 MED ORDER — GLYCOPYRROLATE 0.2 MG/ML IJ SOLN
INTRAMUSCULAR | Status: DC | PRN
  Administered 2014-01-20: 0.4 mg via INTRAVENOUS

## 2014-01-20 MED ORDER — OFLOXACIN 0.3 % OP SOLN
1.0000 [drp] | Freq: Two times a day (BID) | OPHTHALMIC | Status: DC
  Administered 2014-01-20 – 2014-01-27 (×12): 1 [drp] via OPHTHALMIC
  Filled 2014-01-20: qty 5

## 2014-01-20 MED ORDER — BUPIVACAINE LIPOSOME 1.3 % IJ SUSP
20.0000 mL | Freq: Once | INTRAMUSCULAR | Status: AC
  Administered 2014-01-20: 20 mL
  Filled 2014-01-20: qty 20

## 2014-01-20 MED ORDER — 0.9 % SODIUM CHLORIDE (POUR BTL) OPTIME
TOPICAL | Status: DC | PRN
  Administered 2014-01-20: 1000 mL

## 2014-01-20 MED ORDER — METHOCARBAMOL 500 MG PO TABS
500.0000 mg | ORAL_TABLET | Freq: Four times a day (QID) | ORAL | Status: DC | PRN
  Administered 2014-01-21: 500 mg via ORAL
  Filled 2014-01-20 (×2): qty 1

## 2014-01-20 MED ORDER — BISACODYL 10 MG RE SUPP: 10.0000 mg | Freq: Every day | RECTAL | Status: DC | PRN

## 2014-01-20 MED ORDER — HYDROMORPHONE HCL 2 MG PO TABS
2.0000 mg | ORAL_TABLET | ORAL | Status: DC | PRN
  Administered 2014-01-21: 2 mg via ORAL
  Filled 2014-01-20: qty 1

## 2014-01-20 MED ORDER — ONDANSETRON HCL 4 MG PO TABS
4.0000 mg | ORAL_TABLET | Freq: Four times a day (QID) | ORAL | Status: DC | PRN
  Administered 2014-01-22: 4 mg via ORAL
  Filled 2014-01-20: qty 1

## 2014-01-20 MED ORDER — BUPIVACAINE HCL (PF) 0.25 % IJ SOLN
INTRAMUSCULAR | Status: AC
  Filled 2014-01-20: qty 30

## 2014-01-20 MED ORDER — DEXAMETHASONE 6 MG PO TABS
10.0000 mg | ORAL_TABLET | Freq: Every day | ORAL | Status: AC
  Administered 2014-01-21: 10 mg via ORAL
  Filled 2014-01-20: qty 1

## 2014-01-20 MED ORDER — CEFAZOLIN SODIUM-DEXTROSE 2-3 GM-% IV SOLR: 2.0000 g | INTRAVENOUS | Status: DC

## 2014-01-20 MED ORDER — FENTANYL CITRATE 0.05 MG/ML IJ SOLN
25.0000 ug | INTRAMUSCULAR | Status: DC | PRN
  Administered 2014-01-20 (×2): 25 ug via INTRAVENOUS

## 2014-01-20 MED ORDER — ROCURONIUM BROMIDE 100 MG/10ML IV SOLN
INTRAVENOUS | Status: DC | PRN
  Administered 2014-01-20: 20 mg via INTRAVENOUS

## 2014-01-20 MED ORDER — HYDROMORPHONE HCL PF 1 MG/ML IJ SOLN: 0.5000 mg | INTRAMUSCULAR | Status: DC | PRN

## 2014-01-20 MED ORDER — DEXTROSE 5 % IV SOLN
500.0000 mg | Freq: Four times a day (QID) | INTRAVENOUS | Status: DC | PRN
  Administered 2014-01-20 – 2014-01-23 (×2): 500 mg via INTRAVENOUS
  Filled 2014-01-20 (×3): qty 5

## 2014-01-20 MED ORDER — INDAPAMIDE 1.25 MG PO TABS
1.2500 mg | ORAL_TABLET | Freq: Every morning | ORAL | Status: DC
  Administered 2014-01-21 – 2014-01-27 (×6): 1.25 mg via ORAL
  Filled 2014-01-20 (×7): qty 1

## 2014-01-20 MED ORDER — GLYCOPYRROLATE 0.2 MG/ML IJ SOLN
INTRAMUSCULAR | Status: AC
  Filled 2014-01-20: qty 2

## 2014-01-20 MED ORDER — FENTANYL CITRATE 0.05 MG/ML IJ SOLN
INTRAMUSCULAR | Status: DC | PRN
  Administered 2014-01-20: 50 ug via INTRAVENOUS
  Administered 2014-01-20: 25 ug via INTRAVENOUS
  Administered 2014-01-20: 50 ug via INTRAVENOUS
  Administered 2014-01-20: 25 ug via INTRAVENOUS
  Administered 2014-01-20: 50 ug via INTRAVENOUS

## 2014-01-20 MED ORDER — NEOSTIGMINE METHYLSULFATE 1 MG/ML IJ SOLN
INTRAMUSCULAR | Status: DC | PRN
  Administered 2014-01-20: 3 mg via INTRAVENOUS

## 2014-01-20 MED ORDER — NEOSTIGMINE METHYLSULFATE 1 MG/ML IJ SOLN
INTRAMUSCULAR | Status: AC
  Filled 2014-01-20: qty 10

## 2014-01-20 MED ORDER — POLYETHYLENE GLYCOL 3350 17 G PO PACK
17.0000 g | PACK | ORAL | Status: DC
  Administered 2014-01-25: 17 g via ORAL

## 2014-01-20 MED ORDER — LACTATED RINGERS IV SOLN
INTRAVENOUS | Status: DC
  Administered 2014-01-20: 20:00:00 via INTRAVENOUS
  Administered 2014-01-20: 1000 mL via INTRAVENOUS

## 2014-01-20 MED ORDER — METOCLOPRAMIDE HCL 5 MG/ML IJ SOLN: 5.0000 mg | Freq: Three times a day (TID) | INTRAMUSCULAR | Status: DC | PRN

## 2014-01-20 MED ORDER — ACETAMINOPHEN 650 MG RE SUPP
650.0000 mg | Freq: Four times a day (QID) | RECTAL | Status: DC | PRN
  Administered 2014-01-22: 650 mg via RECTAL
  Filled 2014-01-20: qty 1

## 2014-01-20 MED ORDER — ONDANSETRON HCL 4 MG/2ML IJ SOLN
INTRAMUSCULAR | Status: DC | PRN
  Administered 2014-01-20: 4 mg via INTRAVENOUS

## 2014-01-20 MED ORDER — SODIUM CHLORIDE 0.9 % IV SOLN
INTRAVENOUS | Status: DC | PRN
  Administered 2014-01-20: 30 mL via INTRAMUSCULAR

## 2014-01-20 MED ORDER — CHLORHEXIDINE GLUCONATE 4 % EX LIQD: 60.0000 mL | Freq: Once | CUTANEOUS | Status: DC

## 2014-01-20 MED ORDER — SERTRALINE HCL 50 MG PO TABS
50.0000 mg | ORAL_TABLET | Freq: Every morning | ORAL | Status: DC
  Administered 2014-01-21 – 2014-01-27 (×6): 50 mg via ORAL
  Filled 2014-01-20 (×7): qty 1

## 2014-01-20 MED ORDER — POLYETHYLENE GLYCOL 3350 17 G PO PACK
17.0000 g | PACK | Freq: Every day | ORAL | Status: DC | PRN
  Administered 2014-01-21: 17 g via ORAL

## 2014-01-20 MED ORDER — DEXAMETHASONE SODIUM PHOSPHATE 10 MG/ML IJ SOLN
10.0000 mg | Freq: Every day | INTRAMUSCULAR | Status: AC
Start: 2014-01-21 — End: 2014-01-21
  Filled 2014-01-20: qty 1

## 2014-01-20 MED ORDER — SUCCINYLCHOLINE CHLORIDE 20 MG/ML IJ SOLN
INTRAMUSCULAR | Status: DC | PRN
  Administered 2014-01-20: 80 mg via INTRAVENOUS

## 2014-01-20 MED ORDER — VANCOMYCIN HCL IN DEXTROSE 1-5 GM/200ML-% IV SOLN
INTRAVENOUS | Status: AC
  Filled 2014-01-20: qty 200

## 2014-01-20 MED ORDER — ASPIRIN EC 325 MG PO TBEC
325.0000 mg | DELAYED_RELEASE_TABLET | Freq: Every day | ORAL | Status: DC
  Administered 2014-01-21 – 2014-01-27 (×7): 325 mg via ORAL
  Filled 2014-01-20 (×8): qty 1

## 2014-01-20 MED ORDER — PROPOFOL 10 MG/ML IV BOLUS
INTRAVENOUS | Status: AC
  Filled 2014-01-20: qty 20

## 2014-01-20 MED ORDER — ACETAMINOPHEN 500 MG PO TABS
1000.0000 mg | ORAL_TABLET | Freq: Four times a day (QID) | ORAL | Status: AC
  Administered 2014-01-20 – 2014-01-21 (×4): 500 mg via ORAL
  Filled 2014-01-20 (×5): qty 2

## 2014-01-20 MED ORDER — METOCLOPRAMIDE HCL 10 MG PO TABS
5.0000 mg | ORAL_TABLET | Freq: Three times a day (TID) | ORAL | Status: DC | PRN
Start: 2014-01-20 — End: 2014-01-27

## 2014-01-20 MED ORDER — KCL IN DEXTROSE-NACL 20-5-0.9 MEQ/L-%-% IV SOLN
INTRAVENOUS | Status: DC
  Administered 2014-01-20 – 2014-01-26 (×5): via INTRAVENOUS
  Filled 2014-01-20 (×12): qty 1000

## 2014-01-20 MED ORDER — PROPOFOL 10 MG/ML IV BOLUS
INTRAVENOUS | Status: DC | PRN
  Administered 2014-01-20: 30 mg via INTRAVENOUS
  Administered 2014-01-20: 100 mg via INTRAVENOUS

## 2014-01-20 MED ORDER — SODIUM CHLORIDE 0.9 % IV SOLN: INTRAVENOUS | Status: DC

## 2014-01-20 MED ORDER — BUPIVACAINE HCL (PF) 0.25 % IJ SOLN
INTRAMUSCULAR | Status: DC | PRN
  Administered 2014-01-20: 20 mL

## 2014-01-20 MED ORDER — VANCOMYCIN HCL IN DEXTROSE 1-5 GM/200ML-% IV SOLN
1000.0000 mg | Freq: Once | INTRAVENOUS | Status: AC
  Administered 2014-01-20: 1000 mg via INTRAVENOUS

## 2014-01-20 MED ORDER — ONDANSETRON HCL 4 MG/2ML IJ SOLN
INTRAMUSCULAR | Status: AC
  Filled 2014-01-20: qty 2

## 2014-01-20 MED ORDER — DIPHENHYDRAMINE HCL 12.5 MG/5ML PO ELIX: 12.5000 mg | ORAL_SOLUTION | ORAL | Status: DC | PRN

## 2014-01-20 MED ORDER — PHENOL 1.4 % MT LIQD
1.0000 | OROMUCOSAL | Status: DC | PRN
  Administered 2014-01-22: 1 via OROMUCOSAL
  Filled 2014-01-20: qty 177

## 2014-01-20 MED ORDER — DOCUSATE SODIUM 100 MG PO CAPS
100.0000 mg | ORAL_CAPSULE | Freq: Two times a day (BID) | ORAL | Status: DC
  Administered 2014-01-21 – 2014-01-27 (×8): 100 mg via ORAL

## 2014-01-20 SURGICAL SUPPLY — 70 items
BAG SPEC THK2 15X12 ZIP CLS (MISCELLANEOUS) ×3
BAG ZIPLOCK 12X15 (MISCELLANEOUS) ×9 IMPLANT
BIT DRILL 2.8X128 (BIT) ×2 IMPLANT
BIT DRILL 2.8X128MM (BIT) ×1
BLADE EXTENDED COATED 6.5IN (ELECTRODE) ×3 IMPLANT
BLADE SAW SAG 73X25 THK (BLADE)
BLADE SAW SGTL 73X25 THK (BLADE) ×1 IMPLANT
CLOSURE WOUND 1/2 X4 (GAUZE/BANDAGES/DRESSINGS) ×1
CONT SPECI 4OZ STER CLIK (MISCELLANEOUS) IMPLANT
CUP ACETAB 52MM (Orthopedic Implant) ×2 IMPLANT
DRAPE INCISE IOBAN 66X45 STRL (DRAPES) ×3 IMPLANT
DRAPE ORTHO SPLIT 77X108 STRL (DRAPES) ×6
DRAPE POUCH INSTRU U-SHP 10X18 (DRAPES) ×3 IMPLANT
DRAPE SURG ORHT 6 SPLT 77X108 (DRAPES) ×2 IMPLANT
DRAPE U-SHAPE 47X51 STRL (DRAPES) ×3 IMPLANT
DRSG ADAPTIC 3X8 NADH LF (GAUZE/BANDAGES/DRESSINGS) ×2 IMPLANT
DRSG EMULSION OIL 3X16 NADH (GAUZE/BANDAGES/DRESSINGS) ×3 IMPLANT
DRSG MEPILEX BORDER 4X4 (GAUZE/BANDAGES/DRESSINGS) ×4 IMPLANT
DRSG MEPILEX BORDER 4X8 (GAUZE/BANDAGES/DRESSINGS) ×3 IMPLANT
DURAPREP 26ML APPLICATOR (WOUND CARE) ×3 IMPLANT
ELECT REM PT RETURN 9FT ADLT (ELECTROSURGICAL) ×3
ELECTRODE REM PT RTRN 9FT ADLT (ELECTROSURGICAL) ×1 IMPLANT
EVACUATOR 1/8 PVC DRAIN (DRAIN) ×3 IMPLANT
FACESHIELD WRAPAROUND (MASK) ×12 IMPLANT
FACESHIELD WRAPAROUND OR TEAM (MASK) ×4 IMPLANT
GLOVE BIO SURGEON STRL SZ7.5 (GLOVE) ×3 IMPLANT
GLOVE BIO SURGEON STRL SZ8 (GLOVE) ×3 IMPLANT
GLOVE BIOGEL PI IND STRL 8 (GLOVE) ×3 IMPLANT
GLOVE BIOGEL PI IND STRL 8.5 (GLOVE) IMPLANT
GLOVE BIOGEL PI INDICATOR 8 (GLOVE) ×6
GLOVE BIOGEL PI INDICATOR 8.5 (GLOVE) ×2
GLOVE SURG SS PI 6.5 STRL IVOR (GLOVE) ×2 IMPLANT
GLOVE SURG SS PI 8.5 STRL IVOR (GLOVE) ×2
GLOVE SURG SS PI 8.5 STRL STRW (GLOVE) IMPLANT
GOWN STRL REUS TWL 2XL XL LVL4 (GOWN DISPOSABLE) ×2 IMPLANT
GOWN STRL REUS W/TWL LRG LVL3 (GOWN DISPOSABLE) ×6 IMPLANT
GOWN STRL REUS W/TWL XL LVL3 (GOWN DISPOSABLE) ×3 IMPLANT
HEAD FEMORAL SROM 32 PLUS 0 (Hips) IMPLANT
IMMOBILIZER KNEE 20 (SOFTGOODS) IMPLANT
KIT BASIN OR (CUSTOM PROCEDURE TRAY) ×3 IMPLANT
LINER PINNACLE (Hips) ×2 IMPLANT
MANIFOLD NEPTUNE II (INSTRUMENTS) ×3 IMPLANT
NDL SAFETY ECLIPSE 18X1.5 (NEEDLE) IMPLANT
NEEDLE HYPO 18GX1.5 SHARP (NEEDLE) ×3
NS IRRIG 1000ML POUR BTL (IV SOLUTION) ×3 IMPLANT
PACK TOTAL JOINT (CUSTOM PROCEDURE TRAY) ×3 IMPLANT
PASSER SUT SWANSON 36MM LOOP (INSTRUMENTS) ×3 IMPLANT
POSITIONER SURGICAL ARM (MISCELLANEOUS) ×3 IMPLANT
SCREW 6.5MMX25MM (Screw) ×2 IMPLANT
SCREW 6.5MMX30MM (Screw) ×2 IMPLANT
SCREW PINN CAN 6.5X20 (Screw) ×2 IMPLANT
SPONGE GAUZE 4X4 12PLY (GAUZE/BANDAGES/DRESSINGS) ×3 IMPLANT
SPONGE LAP 18X18 X RAY DECT (DISPOSABLE) ×3 IMPLANT
SROM FEMORAL HEAD 32 PLUS 0 (Hips) ×3 IMPLANT
STAPLER VISISTAT 35W (STAPLE) ×3 IMPLANT
STRIP CLOSURE SKIN 1/2X4 (GAUZE/BANDAGES/DRESSINGS) ×1 IMPLANT
SUCTION FRAZIER TIP 10 FR DISP (SUCTIONS) ×3 IMPLANT
SUT ETHIBOND NAB CT1 #1 30IN (SUTURE) ×8 IMPLANT
SUT MNCRL AB 4-0 PS2 18 (SUTURE) ×2 IMPLANT
SUT VIC AB 1 CT1 27 (SUTURE) ×9
SUT VIC AB 1 CT1 27XBRD ANTBC (SUTURE) ×3 IMPLANT
SUT VIC AB 2-0 CT1 27 (SUTURE) ×9
SUT VIC AB 2-0 CT1 TAPERPNT 27 (SUTURE) ×3 IMPLANT
SUT VLOC 180 0 24IN GS25 (SUTURE) ×6 IMPLANT
SWAB COLLECTION DEVICE MRSA (MISCELLANEOUS) ×1 IMPLANT
SYR 50ML LL SCALE MARK (SYRINGE) ×2 IMPLANT
TOWEL OR 17X26 10 PK STRL BLUE (TOWEL DISPOSABLE) ×6 IMPLANT
TRAY FOLEY CATH 14FRSI W/METER (CATHETERS) ×3 IMPLANT
TUBE ANAEROBIC SPECIMEN COL (MISCELLANEOUS) IMPLANT
WATER STERILE IRR 1500ML POUR (IV SOLUTION) ×3 IMPLANT

## 2014-01-20 NOTE — Transfer of Care (Signed)
Immediate Anesthesia Transfer of Care Note  Patient: Loretta JordanJane W Garcia  Procedure(s) Performed: Procedure(s) (LRB): RIGHT HIP ACETABULAR REVISION  (Right)  Patient Location: PACU  Anesthesia Type: General  Level of Consciousness: sedated, patient cooperative and responds to stimulation  Airway & Oxygen Therapy: Patient Spontanous Breathing and Patient connected to face mask oxgen  Post-op Assessment: Report given to PACU RN and Post -op Vital signs reviewed and stable  Post vital signs: Reviewed and stable  Complications: No apparent anesthesia complications

## 2014-01-20 NOTE — Interval H&P Note (Signed)
History and Physical Interval Note:  01/20/2014 1:14 PM  Loretta JordanJane W Bauserman  has presented today for surgery, with the diagnosis of RIGHT HIP DISLOCATION  The various methods of treatment have been discussed with the patient and family. After consideration of risks, benefits and other options for treatment, the patient has consented to  Procedure(s): RIGHT HIP CONSTRAINED LINER (Right) as a surgical intervention .  The patient's history has been reviewed, patient examined, no change in status, stable for surgery.  I have reviewed the patient's chart and labs.  Questions were answered to the patient's satisfaction.     Gus RankinFrank V Kirah Stice

## 2014-01-20 NOTE — Anesthesia Preprocedure Evaluation (Addendum)
Anesthesia Evaluation  Patient identified by MRN, date of birth, ID band Patient awake    Reviewed: Allergy & Precautions, H&P , NPO status , Patient's Chart, lab work & pertinent test results  Airway Mallampati: II TM Distance: >3 FB Neck ROM: Full    Dental no notable dental hx.    Pulmonary neg pulmonary ROS,  breath sounds clear to auscultation  Pulmonary exam normal       Cardiovascular hypertension, Rhythm:Regular Rate:Normal     Neuro/Psych PSYCHIATRIC DISORDERS Anxiety Dementia  CVA negative psych ROS   GI/Hepatic Neg liver ROS, GERD-  ,  Endo/Other  negative endocrine ROS  Renal/GU negative Renal ROS  negative genitourinary   Musculoskeletal negative musculoskeletal ROS (+)   Abdominal   Peds negative pediatric ROS (+)  Hematology negative hematology ROS (+)   Anesthesia Other Findings   Reproductive/Obstetrics negative OB ROS                          Anesthesia Physical Anesthesia Plan  ASA: III  Anesthesia Plan: Spinal   Post-op Pain Management:    Induction: Intravenous  Airway Management Planned:   Additional Equipment:   Intra-op Plan:   Post-operative Plan:   Informed Consent: I have reviewed the patients History and Physical, chart, labs and discussed the procedure including the risks, benefits and alternatives for the proposed anesthesia with the patient or authorized representative who has indicated his/her understanding and acceptance.   Dental advisory given  Plan Discussed with: CRNA  Anesthesia Plan Comments: (Discussed general versus spinal. Discussed risks/benefits of spinal including headache, backache, failure, bleeding, infection, and nerve damage. Patient consents to spinal. Questions answered. Coagulation studies and platelet count acceptable.)       Anesthesia Quick Evaluation

## 2014-01-20 NOTE — H&P (View-Only) (Signed)
Reason for Consult:right hip pain Referring Physician: EDP  HPI: Loretta Garcia is an 80 y.o. female resident of [facility] with long history of bilateral hip difficulties with multiple revisions, most recently by Dr. Wynelle Link (2008?), fell today with c/o right hip pain and inability to bear weight. No loc.  Past Medical History  Diagnosis Date  . Hypertension   . Osteoporosis   . GERD (gastroesophageal reflux disease)   . Potassium (K) deficiency   . UTI (lower urinary tract infection)     Past Surgical History  Procedure Laterality Date  . Joint replacement      History reviewed. No pertinent family history.  Social History:  reports that she has never smoked. She does not have any smokeless tobacco history on file. She reports that she does not drink alcohol. Her drug history is not on file.  Allergies:  Allergies  Allergen Reactions  . Codeine Other (See Comments)    unknown  . Darvocet [Propoxyphene N-Acetaminophen] Other (See Comments)    unknown  . Plavix [Clopidogrel Bisulfate] Other (See Comments)    unsure    Medications: Prior to Admission:  Results for orders placed during the hospital encounter of 01/07/14 (from the past 48 hour(s))  CBC WITH DIFFERENTIAL     Status: None   Collection Time    01/07/14  2:28 PM      Result Value Ref Range   WBC 6.1  4.0 - 10.5 K/uL   RBC 4.28  3.87 - 5.11 MIL/uL   Hemoglobin 13.3  12.0 - 15.0 g/dL   HCT 38.3  36.0 - 46.0 %   MCV 89.5  78.0 - 100.0 fL   MCH 31.1  26.0 - 34.0 pg   MCHC 34.7  30.0 - 36.0 g/dL   RDW 13.7  11.5 - 15.5 %   Platelets 229  150 - 400 K/uL   Neutrophils Relative % 66  43 - 77 %   Neutro Abs 4.0  1.7 - 7.7 K/uL   Lymphocytes Relative 22  12 - 46 %   Lymphs Abs 1.4  0.7 - 4.0 K/uL   Monocytes Relative 11  3 - 12 %   Monocytes Absolute 0.6  0.1 - 1.0 K/uL   Eosinophils Relative 1  0 - 5 %   Eosinophils Absolute 0.1  0.0 - 0.7 K/uL   Basophils Relative 0  0 - 1 %   Basophils Absolute 0.0  0.0  - 0.1 K/uL  BASIC METABOLIC PANEL     Status: Abnormal   Collection Time    01/07/14  2:28 PM      Result Value Ref Range   Sodium 130 (*) 137 - 147 mEq/L   Potassium 3.4 (*) 3.7 - 5.3 mEq/L   Chloride 89 (*) 96 - 112 mEq/L   CO2 29  19 - 32 mEq/L   Glucose, Bld 127 (*) 70 - 99 mg/dL   BUN 14  6 - 23 mg/dL   Creatinine, Ser 0.48 (*) 0.50 - 1.10 mg/dL   Calcium 9.4  8.4 - 10.5 mg/dL   GFR calc non Af Amer 83 (*) >90 mL/min   GFR calc Af Amer >90  >90 mL/min   Comment: (NOTE)     The eGFR has been calculated using the CKD EPI equation.     This calculation has not been validated in all clinical situations.     eGFR's persistently <90 mL/min signify possible Chronic Kidney     Disease.  Dg Hip Complete Right  01/07/2014   CLINICAL DATA:  Fall, right hip pain  EXAM: RIGHT HIP - COMPLETE 2+ VIEW  COMPARISON:  Prior radiograph from 09/06/2010  FINDINGS: Bilateral total hip arthroplasties are in place. The femoral component of the right hip arthroplasty is dislocated superiorly relative to its acetabular component. No acute fracture identified. Left hip arthroplasty is in normal anatomic alignment.  Diffuse osteopenia noted. Severe degenerative changes present within the lower lumbar spine. No soft tissue abnormality.  IMPRESSION: 1. Superior dislocation of the right total hip arthroplasty. 2. No acute fracture identified. 3. Diffuse osteopenia.   Electronically Signed   By: Jeannine Boga M.D.   On: 01/07/2014 15:23   Ct Head Wo Contrast  01/07/2014   CLINICAL DATA:  Fall  EXAM: CT HEAD WITHOUT CONTRAST  TECHNIQUE: Contiguous axial images were obtained from the base of the skull through the vertex without intravenous contrast.  COMPARISON:  Prior CT from 10/02/2009  FINDINGS: Advanced age-related atrophy with chronic microvascular ischemic disease is present. Remote bilateral cerebellar infarcts noted, new as compared to most recent exam.  There is no acute intracranial hemorrhage or  infarct. No mass lesion or midline shift. Gray-white matter differentiation is well maintained. Ventricles are normal in size without evidence of hydrocephalus. No extra-axial fluid collection.  The calvarium is intact.  Postsurgical changes noted at the right orbit. Globes are otherwise unremarkable.  The paranasal sinuses and mastoid air cells are well pneumatized and free of fluid.  Small left parietal scalp contusion is present.  IMPRESSION: 1. Left parieto-occipital scalp contusion. No acute intracranial process. 2. Advanced age related atrophy with chronic microvascular ischemic disease. 3. Remote bilateral cerebellar infarcts.   Electronically Signed   By: Jeannine Boga M.D.   On: 01/07/2014 15:30     Vitals Temp:  [98.4 F (36.9 C)] 98.4 F (36.9 C) (03/31 1551) Pulse Rate:  [59-76] 63 (03/31 1710) Resp:  [14-18] 18 (03/31 1710) BP: (109-140)/(40-65) 119/65 mmHg (03/31 1710) SpO2:  [91 %-100 %] 99 % (03/31 1710) There is no weight on file to calculate BMI.  Physical Exam: thin wf cooperative, c/o right hip pain. RLE held slightly flexed and internally rotated. Intact to light touch, good ankle motion.     Assessment/Plan: Impression: dislocated right THA s/p previous multiple revisions Treatment: closed reduction under IV sedation in ED, post reduction xray shows concentric reduction Plan: weight bearing as tolerated right hip, knee imobilizer while in bed, continue total hip precautions, follow up with Dr. Wynelle Link in 2-3 weeks.  Wilkins Elpers M 01/07/2014, 5:13 PM

## 2014-01-20 NOTE — Brief Op Note (Signed)
01/20/2014  7:58 PM  PATIENT:  Loretta JordanJane W Garcia  80 y.o. female  PRE-OPERATIVE DIAGNOSIS:  RIGHT HIP DISLOCATION  POST-OPERATIVE DIAGNOSIS:  right hip dislocation  PROCEDURE:  Procedure(s): RIGHT HIP ACETABULAR REVISION  (Right)  SURGEON:  Surgeon(s) and Role:    * Loanne DrillingFrank V Rewa Weissberg, MD - Primary  PHYSICIAN ASSISTANT:   ASSISTANTS: Avel Peacerew Perkins, PA-C   ANESTHESIA:   general  EBL:  Total I/O In: 1000 [I.V.:1000] Out: 350 [Urine:150; Blood:200]  BLOOD ADMINISTERED:none  DRAINS: none   LOCAL MEDICATIONS USED:  OTHER [employer]  COUNTS:  YES  TOURNIQUET:  * No tourniquets in log *  DICTATION: .Other Dictation: Dictation Number [id number]  PLAN OF CARE: Admit to inpatient   PATIENT DISPOSITION:  PACU - hemodynamically stable.

## 2014-01-20 NOTE — H&P (Signed)
KERENSA Garcia is an 80 y.o. female.   Chief Complaint: Right Hip Pain HPI: Loretta Garcia is a 80 yo female who presents with right hip pain secondary to a dislocation. She had a major revision THA approximately 15 years ago with allografting of the femur and acetabulum and did well until 2 weeks ago when she dislocated the hip on 2 consecutive days. She did not have any problems prior to those episodes. I saw her in the office last week and she complained of shortening of her right leg and radiographs showed another dislocation. She was comfortable and neurovascularly intact thus we did not proceed with immediate operative reduction. It was decided to proceed with revision to a constrained liner and the instruments had to be special ordered given the earlier generation of that particular prosthesis. She presents today for revision to a constrained acetabulum.  Past Medical History  Diagnosis Date  . Hypertension   . Osteoporosis   . GERD (gastroesophageal reflux disease)   . Potassium (K) deficiency   . UTI (lower urinary tract infection)     LAST UTI MARCH 2015 ?  Marland Kitchen Stroke     TIA - 20 SOME YRS AGO - STILL HAS TINGLING LEFT LEG - NO OTHER RESIDUALS  . Anxiety   . Dementia     STILL ABLE TO SIGN OWN LEGAL PAPERS  . Arthritis     C/O OF PAIN IN MIDDLE BACK - PAST HX OF FRACTURE / OSTEOPOROSIS  . Recurrent falls     USES WALKER - LIVING INDEPENDENTLY IN APARTMENT AT [facility] WITH HIP DISLOCATION- IS CURRENTLY AT [facility]  [phone]    Past Surgical History  Procedure Laterality Date  . Joint replacement      RIGHT HIP REPLACEMENT AND REVISIONS; LEFT TOTAL HIP REPLACEMENT AND REVISION.  Marland Kitchen Eye surgery      SURGERY FOR DETACHED RETINA    History reviewed. No pertinent family history. Social History:  reports that she has never smoked. She has never used smokeless tobacco. She reports that she does not drink alcohol or use illicit drugs.  Allergies:   Allergies  Allergen Reactions  . Altace [Ramipril]     Per MAR  . Calcitonin     Per MAR  . Cardura [Doxazosin Mesylate]     Per MAR  . Fosamax [Alendronate Sodium]     Per MAR  . Penicillins     Per MAR  . Tramadol     Per MAR  . Codeine Other (See Comments)    unknown  . Darvocet [Propoxyphene N-Acetaminophen] Other (See Comments)    unknown  . Plavix [Clopidogrel Bisulfate] Other (See Comments)    unsure    Medications Prior to Admission  Medication Sig Dispense Refill  . acetaminophen (TYLENOL) 500 MG tablet Take 1,000 mg by mouth 3 (three) times daily.      . APPLE CIDER VINEGAR PO Take 5 mLs by mouth every evening.       Marland Kitchen aspirin EC 81 MG tablet Take 81 mg by mouth daily.      Marland Kitchen BIOTIN 5000 PO Take 5,000 mcg by mouth daily.      . Calcium Citrate-Vitamin D (CALCIUM CITRATE + D) 315-250 MG-UNIT TABS Take 2 tablets by mouth 2 (two) times daily.      Marland Kitchen CRANBERRY-VITAMIN C PO Take 1 tablet by mouth daily.      . Cyanocobalamin 2500 MCG CHEW Chew 2,500 mcg by mouth  daily.      . indapamide (LOZOL) 1.25 MG tablet Take 1.25 mg by mouth every morning.      . Melatonin 5 MG TABS Take 5 mg by mouth at bedtime as needed (Sleep).      . Multiple Vitamin (MULTIVITAMIN WITH MINERALS) TABS tablet Take 1 tablet by mouth daily.      Marland Kitchen ofloxacin (OCUFLOX) 0.3 % ophthalmic solution Place 1 drop into the right eye 2 (two) times daily.      . polyethylene glycol (MIRALAX / GLYCOLAX) packet Take 17 g by mouth every other day.      . Potassium Chloride CR (MICRO-K) 8 MEQ CPCR capsule CR Take 8 mEq by mouth daily.      . sertraline (ZOLOFT) 50 MG tablet Take 50 mg by mouth every morning.      . vitamin C (ASCORBIC ACID) 500 MG tablet Take 500 mg by mouth daily.        Results for orders placed during the hospital encounter of 01/20/14 (from the past 48 hour(s))  APTT     Status: None   Collection Time    01/20/14  1:15 PM      Result Value Ref Range   aPTT 26  24 - 37 seconds   COMPREHENSIVE METABOLIC PANEL     Status: Abnormal   Collection Time    01/20/14  1:15 PM      Result Value Ref Range   Sodium 138  137 - 147 mEq/L   Potassium 3.0 (*) 3.7 - 5.3 mEq/L   Chloride 96  96 - 112 mEq/L   CO2 30  19 - 32 mEq/L   Glucose, Bld 82  70 - 99 mg/dL   BUN 17  6 - 23 mg/dL   Creatinine, Ser 0.57  0.50 - 1.10 mg/dL   Calcium 9.9  8.4 - 10.5 mg/dL   Total Protein 6.1  6.0 - 8.3 g/dL   Albumin 2.9 (*) 3.5 - 5.2 g/dL   AST 20  0 - 37 U/L   ALT 13  0 - 35 U/L   Alkaline Phosphatase 62  39 - 117 U/L   Total Bilirubin 0.3  0.3 - 1.2 mg/dL   GFR calc non Af Amer 79 (*) >90 mL/min   GFR calc Af Amer >90  >90 mL/min   Comment: (NOTE)     The eGFR has been calculated using the CKD EPI equation.     This calculation has not been validated in all clinical situations.     eGFR's persistently <90 mL/min signify possible Chronic Kidney     Disease.  PROTIME-INR     Status: None   Collection Time    01/20/14  1:15 PM      Result Value Ref Range   Prothrombin Time 12.9  11.6 - 15.2 seconds   INR 0.99  0.00 - 1.49  TYPE AND SCREEN     Status: None   Collection Time    01/20/14  1:30 PM      Result Value Ref Range   ABO/RH(D) O POS     Antibody Screen NEG     Sample Expiration 01/23/2014     No results found.  Review of Systems  Constitutional: Negative.   HENT: Negative.   Eyes: Negative.   Respiratory: Negative.   Cardiovascular: Negative.   Gastrointestinal: Negative.   Genitourinary: Negative.   Musculoskeletal: Positive for joint pain.  Skin: Negative.   Neurological: Negative.   Endo/Heme/Allergies:  Negative.   Psychiatric/Behavioral: Negative.     Blood pressure 156/77, pulse 76, temperature 98 F (36.7 C), temperature source Oral, resp. rate 16, height 5' (1.524 m), weight 104 lb 4 oz (47.287 kg), SpO2 94.00%. Physical Exam Physical Examination: General appearance - alert, well appearing, and in no distress Mental status - alert, oriented to  person, place, and time Neck - supple, no significant adenopathy Lymphatics - no palpable lymphadenopathy, no hepatosplenomegaly Chest - clear to auscultation, no wheezes, rales or rhonchi, symmetric air entry Heart - normal rate, regular rhythm, normal S1, S2, no murmurs, rubs, clicks or gallops Abdomen - soft, nontender, nondistended, no masses or organomegaly Neurological - alert, oriented, normal speech, no focal findings or movement disorder noted Right lower extremity shortened and slightly externally rotated. Motor sensation and pulses intact  X-ray with posterior superior dislocation of the hip   Assessment/Plan Right hip recurrent dislocation- Patient presents for right hip revision to constrained liner. Discussed procedure, risks, potential complications with patient and daughter and they elect to proceed.  Dione Plover Ahnesty Finfrock 01/20/2014, 3:34 PM

## 2014-01-21 ENCOUNTER — Encounter (HOSPITAL_COMMUNITY): Payer: Self-pay | Admitting: Orthopedic Surgery

## 2014-01-21 DIAGNOSIS — I10 Essential (primary) hypertension: Secondary | ICD-10-CM | POA: Diagnosis present

## 2014-01-21 DIAGNOSIS — D62 Acute posthemorrhagic anemia: Secondary | ICD-10-CM | POA: Diagnosis not present

## 2014-01-21 DIAGNOSIS — K219 Gastro-esophageal reflux disease without esophagitis: Secondary | ICD-10-CM | POA: Diagnosis present

## 2014-01-21 DIAGNOSIS — E876 Hypokalemia: Secondary | ICD-10-CM | POA: Diagnosis present

## 2014-01-21 LAB — BASIC METABOLIC PANEL WITH GFR
BUN: 14 mg/dL (ref 6–23)
CO2: 30 meq/L (ref 19–32)
Calcium: 8.6 mg/dL (ref 8.4–10.5)
Chloride: 97 meq/L (ref 96–112)
Creatinine, Ser: 0.57 mg/dL (ref 0.50–1.10)
GFR calc Af Amer: >90 mL/min
GFR calc non Af Amer: 79 mL/min — ABNORMAL LOW
Glucose, Bld: 143 mg/dL — ABNORMAL HIGH (ref 70–99)
Potassium: 3.3 meq/L — ABNORMAL LOW (ref 3.7–5.3)
Sodium: 136 meq/L — ABNORMAL LOW (ref 137–147)

## 2014-01-21 LAB — CBC
HCT: 31 % — ABNORMAL LOW (ref 36.0–46.0)
Hemoglobin: 10.8 g/dL — ABNORMAL LOW (ref 12.0–15.0)
MCH: 31.8 pg (ref 26.0–34.0)
MCHC: 34.8 g/dL (ref 30.0–36.0)
MCV: 91.2 fL (ref 78.0–100.0)
PLATELETS: 248 10*3/uL (ref 150–400)
RBC: 3.4 MIL/uL — AB (ref 3.87–5.11)
RDW: 14.1 % (ref 11.5–15.5)
WBC: 8.5 10*3/uL (ref 4.0–10.5)

## 2014-01-21 MED ORDER — POTASSIUM CHLORIDE CRYS ER 20 MEQ PO TBCR
40.0000 meq | EXTENDED_RELEASE_TABLET | Freq: Every day | ORAL | Status: DC
  Administered 2014-01-21 – 2014-01-22 (×2): 40 meq via ORAL
  Filled 2014-01-21 (×4): qty 2

## 2014-01-21 NOTE — Progress Notes (Signed)
   Subjective: 1 Day Post-Op Procedure(s) (LRB): RIGHT HIP ACETABULAR REVISION  (Right) Patient reports pain as mild and moderate.   Patient seen in rounds with Dr. Lequita HaltAluisio. Patient is well, but has had some minor complaints of pain in the hip, requiring pain medications We will start therapy today.  Plan is to go Home after hospital stay.  Objective: Vital signs in last 24 hours: Temp:  [97.5 F (36.4 C)-98.2 F (36.8 C)] 98.2 F (36.8 C) (04/14 91470638) Pulse Rate:  [59-80] 80 (04/14 0638) Resp:  [16-18] 17 (04/14 0638) BP: (102-157)/(62-95) 102/62 mmHg (04/14 0638) SpO2:  [94 %-99 %] 94 % (04/14 82950638) Weight:  [47.287 kg (104 lb 4 oz)] 47.287 kg (104 lb 4 oz) (04/13 1244)  Intake/Output from previous day:  Intake/Output Summary (Last 24 hours) at 01/21/14 0800 Last data filed at 01/21/14 0659  Gross per 24 hour  Intake   1600 ml  Output    670 ml  Net    930 ml    Intake/Output this shift:    Labs:  Recent Labs  01/21/14 0430  HGB 10.8*    Recent Labs  01/21/14 0430  WBC 8.5  RBC 3.40*  HCT 31.0*  PLT 248    Recent Labs  01/20/14 1315 01/21/14 0430  NA 138 136*  K 3.0* 3.3*  CL 96 97  CO2 30 30  BUN 17 14  CREATININE 0.57 0.57  GLUCOSE 82 143*  CALCIUM 9.9 8.6    Recent Labs  01/20/14 1315  INR 0.99    EXAM General - Patient is Alert, Appropriate and Oriented Extremity - Neurovascular intact Sensation intact distally Dressing - dressing C/D/I Motor Function - intact, moving foot and toes well on exam.  Hemovac pulled without difficulty.  Past Medical History  Diagnosis Date  . Hypertension   . Osteoporosis   . GERD (gastroesophageal reflux disease)   . Potassium (K) deficiency   . UTI (lower urinary tract infection)     LAST UTI MARCH 2015 ?  Marland Kitchen. Stroke     TIA - 20 SOME YRS AGO - STILL HAS TINGLING LEFT LEG - NO OTHER RESIDUALS  . Anxiety   . Dementia     STILL ABLE TO SIGN OWN LEGAL PAPERS  . Arthritis     C/O OF PAIN IN  MIDDLE BACK - PAST HX OF FRACTURE / OSTEOPOROSIS  . Recurrent falls     USES WALKER - L[facility]DEPENDENT[facility] UNTIL RECENT PROB WITH HIP DISLOCATION- IS CURRENTLY [facility]  [phone]    Assessment/Plan: 1 Day Post-Op Procedure(s) (LRB): RIGHT HIP ACETABULAR REVISION  (Right) Principal Problem:   Dislocation of hip joint prosthesis  Estimated body mass index is 20.36 kg/(m^2) as calculated from the following:   Height as of this encounter: 5' (1.524 m).   Weight as of this encounter: 47.287 kg (104 lb 4 oz). Advance diet Up with therapy Discharge home with home health  DVT Prophylaxis - Aspirin Weight Bearing As Tolerated right Leg Begin Therapy  Ellisyn Icenhower 01/21/2014, 8:00 AM

## 2014-01-21 NOTE — Care Management Note (Signed)
    Page 1 of 1   01/21/2014     12:20:31 PM   CARE MANAGEMENT NOTE 01/21/2014  Patient:  Loretta Garcia,Loretta Garcia   Account Number:  [id number]  Date Initiated:  01/21/2014  Documentation initiated by:  Lorenda IshiharaPEELE,Ashiya Kinkead  Subjective/Objective Assessment:   80 yo female admitted R hip acetabular revision. [facility]     Action/Plan:   Recommendations for SNF from PT, Patient wants to go home   Anticipated DC Date:  01/24/2014   Anticipated DC Plan:  SKILLED NURSING FACILITY  In-house referral  Clinical Social Worker      DC Planning Services  CM consult      Choice offered to / List presented to:             Status of service:  Completed, signed off Medicare Important Message given?  NA - LOS <3 / Initial given by admissions (If response is "NO", the following Medicare IM given date fields will be blank) Date Medicare IM given:   Date Additional Medicare IM given:    Discharge Disposition:  SKILLED NURSING FACILITY  Per UR Regulation:  Reviewed for med. necessity/level of care/duration of stay  If discussed at Long Length of Stay Meetings, dates discussed:    Comments:

## 2014-01-21 NOTE — Progress Notes (Signed)
Clinical Social Work Department BRIEF PSYCHOSOCIAL ASSESSMENT 01/21/2014  Patient:  Loretta Garcia, Loretta Garcia     Account Number:  [id number]     Admit date:  01/20/2014  Clinical Social Worker:  Lacie Scotts  Date/Time:  01/21/2014 03:55 PM  Referred by:  Physician  Date Referred:  01/21/2014 Referred for  SNF Placement   Other Referral:   Interview type:  Patient Other interview type:    PSYCHOSOCIAL DATA Living Status:  FACILITY Admitted from facility:  [facility] Level of care:  Independent Living Primary support name:  Loretta Garcia Primary support relationship to patient:  CHILD, ADULT Degree of support available:   unclear    CURRENT CONCERNS Current Concerns  Post-Acute Placement   Other Concerns:    SOCIAL WORK ASSESSMENT / PLAN Pt is a 80 yr old female admitted from [facility]. CSW met with pt to assist with d/c planning needs. PT has recommended rehab at [facility] following hospital d/c. Pt prefers to return to her apt . Pt requires 2+ assistance at this time which is not available at independent living. Pt stated she was willing to consider ST Rehab. CSW plans to meet with pt again tomorrow to assist with d/c planning. [facility] does have a SNF bed for pt if she will agree to rehab.   Assessment/plan status:  Psychosocial Support/Ongoing Assessment of Needs Other assessment/ plan:   Information/referral to community resources:   PT recommendations reviewed with pt.    PATIENT'S/FAMILY'S RESPONSE TO PLAN OF CARE: Pt has been to rehab at [facility] in the past and is willing to consider this option, though she prefers to return to her apt. D/C planning is ongoing.   Werner Lean LCSW [phone]

## 2014-01-21 NOTE — Evaluation (Signed)
Physical Therapy Evaluation Patient Details Name: Loretta Garcia MRN: [id number] DOB: 02-23-1920 Today's Date: 01/21/2014   History of Present Illness  80 yo female s/p R hip acetabular revision with constrained liner 4/13. Hx of HTN, osteoporosis, dementia, anxiety, TIA, L THA, multiple dislocations/surgeries R hip. Pt is from Ind Living  Clinical Impression  On eval, pt required Mod assist +2 for mobility-only able to take a few steps in room with walker. Mobility is significantly limited by pain. Increased pain with any and all activity. Recommend ST rehab at SNF if pt will agree. If not, pt will need 24 hour supervision/assist (possibly 2 person assist).     Follow Up Recommendations SNF;Supervision/Assistance - 24 hour (if pt will agree. )    Equipment Recommendations  None recommended by PT    Recommendations for Other Services OT consult     Precautions / Restrictions Precautions Precautions: Fall;Posterior Hip Precaution Comments: Educated pt on/demonstrated posterior hip precautions Restrictions Weight Bearing Restrictions: No RLE Weight Bearing: Weight bearing as tolerated      Mobility  Bed Mobility Overal bed mobility: Needs Assistance Bed Mobility: Supine to Sit     Supine to sit: Max assist;HOB elevated     General bed mobility comments: Assist for trunk and bil LEs off bed. Increased time. Utillized bedpad for scooting, positioning.   Transfers Overall transfer level: Needs assistance Equipment used: Rolling walker (2 wheeled) Transfers: Sit to/from [employer]Stand;Stand Pivot Transfers Sit to Stand: Max assist Stand pivot transfers: Mod assist;+2 physical assistance;+2 safety/equipment       General transfer comment: Assist to rise, stabilize, control descent, manevuer with RW. Pt tends to maintain a flexed trunk posture-Max cueing for pt to fully extend trunk and bil UEs for support.   Ambulation/Gait Ambulation/Gait assistance: Mod assist;+2 physical  assistance;+2 safety/equipment Ambulation Distance (Feet): 3 Feet Assistive device: Rolling walker (2 wheeled) Gait Pattern/deviations: Trunk flexed;Decreased step length - right;Decreased step length - left;Decreased stance time - right;Decreased stride length;Step-to pattern;Decreased weight shift to right     General Gait Details: Max cueing for safety, posture, sequence, distance from walker. Pt wants to keep RW too far ahead. Poor use of UEs to assist with WBing on R LE. Fatigues very easily. Recliner had to be brought up behind pt to sit.   Stairs            Wheelchair Mobility    Modified Rankin (Stroke Patients Only)       Balance Overall balance assessment: Needs assistance         Standing balance support: Bilateral upper extremity supported;During functional activity Standing balance-Leahy Scale: Poor                               Pertinent Vitals/Pain 5/10 R hip at rest; 9/10 R hip with activity    Home Living Family/patient expects to be discharged to:: Unsure (pt prefers to return back home) Living Arrangements: Alone Available Help at Discharge:  (pt states she plans to arrange for aides) Type of Home: Independent living facility Home Access: Level entry     Home Layout: One level Home Equipment: Walker - 2 wheels      Prior Function Level of Independence: Independent with assistive device(s)               Hand Dominance        Extremity/Trunk Assessment   Upper Extremity Assessment: Defer to OT evaluation  Lower Extremity Assessment: RLE deficits/detail RLE Deficits / Details: hip flex 2-/5, hip abd/add 2-/5, moves ankle well. Limited significantly by pain    Cervical / Trunk Assessment: Kyphotic  Communication   Communication: HOH  Cognition Arousal/Alertness: Awake/alert Behavior During Therapy: WFL for tasks assessed/performed Overall Cognitive Status: Within Functional Limits for tasks assessed                       General Comments      Exercises        Assessment/Plan    PT Assessment Patient needs continued PT services  PT Diagnosis Difficulty walking;Abnormality of gait;Generalized weakness;Acute pain   PT Problem List Decreased strength;Decreased range of motion;Decreased activity tolerance;Decreased balance;Decreased mobility;Decreased knowledge of use of DME;Decreased safety awareness;Decreased knowledge of precautions;Pain  PT Treatment Interventions DME instruction;Gait training;Functional mobility training;Therapeutic activities;Therapeutic exercise;Patient/family education;Balance training   PT Goals (Current goals can be found in the Care Plan section) Acute Rehab PT Goals Patient Stated Goal: to return home with aides PT Goal Formulation: With patient Time For Goal Achievement: 01/28/14 Potential to Achieve Goals: Fair    Frequency 7X/week   Barriers to discharge        Co-evaluation               End of Session Equipment Utilized During Treatment: Gait belt Activity Tolerance: Patient limited by fatigue;Patient limited by pain Patient left: in chair;with call bell/phone within reach           Time: 1020-1052 PT Time Calculation (min): 32 min   Charges:   PT Evaluation $Initial PT Evaluation Tier I: 1 Procedure PT Treatments $Therapeutic Activity: 23-37 mins   PT G Codes:          Loretta Garcia, MPT Pager: [phone]

## 2014-01-21 NOTE — Op Note (Signed)
NAMLeilani Merl:  Hecht, Adelai                ACCOUNT NO.:  [id number]  MEDICAL RECORD NO.:  [id number]  LOCATION:  1620                         [facility]  PHYSICIAN:  Ollen GrossFrank Ritamarie Arkin, M.D.    DATE OF BIRTH:  1919-12-10  DATE OF PROCEDURE:  01/20/2014 DATE OF DISCHARGE:                              OPERATIVE REPORT   PREOPERATIVE DIAGNOSIS:  Recurrent right hip dislocation.  POSTOPERATIVE DIAGNOSIS:  Recurrent right hip dislocation.  PROCEDURE:  Right hip acetabular revision with placement of a constrained liner.  SURGEON:  Ollen GrossFrank Janete Quilling, MD  ASSISTANT:  Alexzandrew L. Julien GirtPerkins, PA-C  ANESTHESIA:  General.  ESTIMATED BLOOD LOSS:  200.  DRAINS:  None.  COMPLICATIONS:  None.  CONDITION:  Stable to recovery.  BRIEF CLINICAL NOTE:  Ms. Lorelee Newippett is a 80 year old female, who had a right total hip arthroplasty revision with massive allografting done about 15 years ago.  Had done well until 2 weeks ago when she sustained a dislocation and had a subsequent dislocation the day after.  I saw her in the office late last week and the hip was out.  She had an old SROM acetabular shell.  We had to order the constrained liner with special order.  It is now arrived and she presents for acetabular revision to constraint liner versus total hip revision.  PROCEDURE IN DETAIL:  After successful administration of general anesthetic, the patient was placed in the left lateral decubitus position with the right side up and held with the hip positioner.  Right lower extremity is isolated from her perineum with plastic drapes and prepped and draped in the usual sterile fashion.  A posterolateral incision was made with a 10 blade through subcutaneous tissue to the fascia lata, which was incised in line with the skin incision.  Sciatic nerve was palpated and protected and the pseudocapsule excised off the posterior femur.  The hip is currently dislocated.  The femoral head is removed off the femoral  stem.  The stem was well fixed and in good position.  Upon inspecting the acetabular component it was grossly loose and that is why she was dislocating.  I subsequently removed the peripheral screws and removed the acetabular liner.  The acetabular shell was loose.  The 2 screws are removed from the dome and the shell was removed.  She had a previous bulk allograft and that had taken completely to her native bone.  I removed the screw from the bulk allograft and the entire graft was stable.  I then reamed at 49 and 51 mm to get a bleeding bony base.  A 52-mm pinnacle multi hole acetabular shell was impacted in anatomic position with excellent purchase.  I then placed 3 additional dome screws each with excellent purchase.  I threaded the impactor back into the shell and pelvis removing as a solitary unit consistent with a very stable prosthesis.  Given her age of 80 and soft tissue laxity, I decided to place a constrained liner. The 52 mm x 32 neutral +4 liner was impacted into the acetabular shell. A 32+ 0 femoral head was placed.  The hip was reduced and then the constraint ring was locked into position.  There was no impingement throughout range of motion.  She had a very stable range of motion.  The wound was then copiously irrigated with saline solution.  Short rotators and pseudo capsule reattached to the femur through drill holes with Ethibond suture.  Fascia lata was closed with a running #1 V-Loc, subcu interrupted 2-0 Vicryl and subcuticular running 4-0 Monocryl.  Prior to closure, the gluteal muscles, fascia and subcu were injected with 20 mL of Exparel mixed with 30 mL of saline and then another 20 mL of 0.25% Marcaine was injected into the sitting tissues.  Once  closed, the incisions cleaned and dried.  Steri-Strips and a bulky sterile dressing applied.  She was awakened and transported to recovery in stable condition.  Note, that a surgical assistant was a medical necessity  for this procedure to perform it in a safe and expeditious manner.  Surgical assistance was necessary for protection of vital neurovascular structures and retraction and proper placement of the limb in order to safely remove the old prosthesis and safely place the new prosthesis in the appropriate position and with appropriate stability.     Ollen GrossFrank Langley Flatley, M.D.     FA/MEDQ  D:  01/20/2014  T:  01/21/2014  Job:  [id number]

## 2014-01-21 NOTE — Anesthesia Postprocedure Evaluation (Signed)
  Anesthesia Post-op Note  Patient: Loretta JordanJane W Garcia  Procedure(s) Performed: Procedure(s) (LRB): RIGHT HIP ACETABULAR REVISION  (Right)  Patient Location: PACU  Anesthesia Type: General  Level of Consciousness: awake and alert   Airway and Oxygen Therapy: Patient Spontanous Breathing  Post-op Pain: mild  Post-op Assessment: Post-op Vital signs reviewed, Patient's Cardiovascular Status Stable, Respiratory Function Stable, Patent Airway and No signs of Nausea or vomiting  Last Vitals:  Filed Vitals:   01/21/14 0638  BP: 102/62  Pulse: 80  Temp: 36.8 C  Resp: 17    Post-op Vital Signs: stable   Complications: No apparent anesthesia complications

## 2014-01-21 NOTE — Progress Notes (Signed)
Physical Therapy Treatment Patient Details Name: Loretta Garcia MRN: [id number] DOB: 26-Mar-1920 Today's Date: 01/21/2014    History of Present Illness 80 yo female s/p R hip acetabular revision with constrained liner 4/13. Hx of HTN, osteoporosis, dementia, anxiety, TIA, L THA, multiple dislocations/surgeries R hip. Pt is from [facility]    PT Comments    Improved performance this session.continue to recommend SNF.   Follow Up Recommendations  SNF;Supervision/Assistance - 24 hour     Equipment Recommendations  None recommended by PT    Recommendations for Other Services OT consult     Precautions / Restrictions Precautions Precautions: Fall;Posterior Hip Precaution Comments: Educated pt on/demonstrated posterior hip precautions Restrictions Weight Bearing Restrictions: No RLE Weight Bearing: Weight bearing as tolerated    Mobility  Bed Mobility Overal bed mobility: Needs Assistance Bed Mobility: Sit to Supine     Sit to supine: Mod assist   General bed mobility comments: Assist for trunk and bil LEs off bed. Increased time. Utillized bedpad for scooting, positioning.   Transfers Overall transfer level: Needs assistance Equipment used: Rolling walker (2 wheeled) Transfers: Sit to/from Stand Sit to Stand: Mod assist       General transfer comment: Assist to rise, stabilize, control descent, manevuer with RW. Pt tends to maintain a flexed trunk posture-Max cueing for pt to fully extend trunk and bil UEs for support.   Ambulation/Gait Ambulation/Gait assistance: Mod assist;+2 safety/equipment;+2 physical assistance Ambulation Distance (Feet): 25 Feet Assistive device: Rolling walker (2 wheeled) Gait Pattern/deviations: Step-through pattern;Decreased stride length;Trunk flexed     General Gait Details: Max cueing for safety, posture, sequence, distance from walker. Pt wants to keep RW too far ahead.    Stairs            Wheelchair Mobility    Modified  Rankin (Stroke Patients Only)       Balance Overall balance assessment: Needs assistance         Standing balance support: Bilateral upper extremity supported;During functional activity Standing balance-Leahy Scale: Poor                      Cognition Arousal/Alertness: Awake/alert Behavior During Therapy: WFL for tasks assessed/performed Overall Cognitive Status: Within Functional Limits for tasks assessed                      Exercises Total Joint Exercises Ankle Circles/Pumps: AROM;Both;10 reps;Supine Quad Sets: AROM;Both;10 reps;Supine Heel Slides: AAROM;Right;10 reps;Supine Hip ABduction/ADduction: AAROM;Right;Supine;10 reps    General Comments        Pertinent Vitals/Pain 7/10 R hip with activity. Ice applied end of session    Home Living Family/patient expects to be discharged to:: Unsure (pt prefers to return back home) Living Arrangements: Alone Available Help at Discharge:  (pt states she plans to arrange for aides) Type of Home: Independent living facility Home Access: Level entry   Home Layout: One level Home Equipment: Walker - 2 wheels      Prior Function Level of Independence: Independent with assistive device(s)          PT Goals (current goals can now be found in the care plan section) Acute Rehab PT Goals Patient Stated Goal: to return home with aides PT Goal Formulation: With patient Time For Goal Achievement: 01/28/14 Potential to Achieve Goals: Fair Progress towards PT goals: Progressing toward goals    Frequency  7X/week    PT Plan Current plan remains appropriate    Co-evaluation  End of Session Equipment Utilized During Treatment: Gait belt Activity Tolerance: Patient limited by fatigue;Patient limited by pain Patient left: in bed;with call bell/phone within reach     Time: [id number] PT Time Calculation (min): 21 min  Charges:  $Gait Training: 8-22 mins $Therapeutic Activity: 23-37  mins                    G Codes:      Rebeca AlertJannie Isabelly Kobler, MPT Pager: [phone]

## 2014-01-21 NOTE — Progress Notes (Signed)
Utilization review completed.  

## 2014-01-21 NOTE — Progress Notes (Signed)
[facility]. She hopes to return to her apt with increased services following hospital d/c. CSW is available to assist with a rehab placement if recommended by PT and pt is in agreement with plan.  Cori RazorJamie Taji Barretto LCSW [phone]

## 2014-01-22 ENCOUNTER — Inpatient Hospital Stay (HOSPITAL_COMMUNITY): Payer: Medicare Other

## 2014-01-22 DIAGNOSIS — J180 Bronchopneumonia, unspecified organism: Secondary | ICD-10-CM | POA: Diagnosis not present

## 2014-01-22 DIAGNOSIS — J690 Pneumonitis due to inhalation of food and vomit: Secondary | ICD-10-CM | POA: Diagnosis not present

## 2014-01-22 DIAGNOSIS — Z96649 Presence of unspecified artificial hip joint: Secondary | ICD-10-CM | POA: Diagnosis not present

## 2014-01-22 DIAGNOSIS — B3781 Candidal esophagitis: Secondary | ICD-10-CM | POA: Diagnosis not present

## 2014-01-22 DIAGNOSIS — T84029A Dislocation of unspecified internal joint prosthesis, initial encounter: Secondary | ICD-10-CM | POA: Diagnosis not present

## 2014-01-22 LAB — BASIC METABOLIC PANEL
BUN: 16 mg/dL (ref 6–23)
CALCIUM: 8.8 mg/dL (ref 8.4–10.5)
CO2: 28 mEq/L (ref 19–32)
CREATININE: 0.58 mg/dL (ref 0.50–1.10)
Chloride: 98 mEq/L (ref 96–112)
GFR calc Af Amer: >90 mL/min (ref >90)
GFR, EST NON AFRICAN AMERICAN: 78 mL/min — AB (ref >90)
Glucose, Bld: 143 mg/dL — ABNORMAL HIGH (ref 70–99)
Potassium: 3.8 mEq/L (ref 3.7–5.3)
Sodium: 134 mEq/L — ABNORMAL LOW (ref 137–147)

## 2014-01-22 LAB — CBC
HCT: 26.9 % — ABNORMAL LOW (ref 36.0–46.0)
Hemoglobin: 9.3 g/dL — ABNORMAL LOW (ref 12.0–15.0)
MCH: 31.2 pg (ref 26.0–34.0)
MCHC: 34.6 g/dL (ref 30.0–36.0)
MCV: 90.3 fL (ref 78.0–100.0)
Platelets: 221 10*3/uL (ref 150–400)
RBC: 2.98 MIL/uL — ABNORMAL LOW (ref 3.87–5.11)
RDW: 14 % (ref 11.5–15.5)
WBC: 10.9 10*3/uL — ABNORMAL HIGH (ref 4.0–10.5)

## 2014-01-22 MED ORDER — GI COCKTAIL ~~LOC~~
30.0000 mL | Freq: Three times a day (TID) | ORAL | Status: DC | PRN
  Administered 2014-01-22: 30 mL via ORAL
  Filled 2014-01-22 (×2): qty 30

## 2014-01-22 MED ORDER — PANTOPRAZOLE SODIUM 40 MG PO TBEC
40.0000 mg | DELAYED_RELEASE_TABLET | Freq: Every day | ORAL | Status: DC
  Administered 2014-01-22: 40 mg via ORAL
  Filled 2014-01-22 (×2): qty 1

## 2014-01-22 MED ORDER — IOHEXOL 350 MG/ML SOLN
100.0000 mL | Freq: Once | INTRAVENOUS | Status: AC | PRN
  Administered 2014-01-22: 100 mL via INTRAVENOUS

## 2014-01-22 MED ORDER — POLYSACCHARIDE IRON COMPLEX 150 MG PO CAPS
150.0000 mg | ORAL_CAPSULE | Freq: Every day | ORAL | Status: DC
  Administered 2014-01-22: 150 mg via ORAL
  Filled 2014-01-22 (×3): qty 1

## 2014-01-22 NOTE — Progress Notes (Signed)
Informed Avel Peacerew Perkins PA about pt pulling out IV last night; he stated that it was okay to leave it out, possible discharge tomorrow. Will continue to monitor.

## 2014-01-22 NOTE — Progress Notes (Signed)
   Subjective: 2 Days Post-Op Procedure(s) (LRB): RIGHT HIP ACETABULAR REVISION  (Right) Patient reports pain as mild.   Patient seen in rounds for Dr. Lequita HaltAluisio. Patient is well, but has had some minor complaints of pain in the hip, requiring pain medications Plan is to go Home [facility] stay.  Objective: Vital signs in last 24 hours: Temp:  [97.9 F (36.6 C)-98.9 F (37.2 C)] 98.1 F (36.7 C) (04/15 0604) Pulse Rate:  [72-84] 72 (04/15 0604) Resp:  [16] 16 (04/15 0604) BP: (107-113)/(57-67) 113/63 mmHg (04/15 0604) SpO2:  [96 %-97 %] 96 % (04/15 0604)  Intake/Output from previous day:  Intake/Output Summary (Last 24 hours) at 01/22/14 0709 Last data filed at 01/22/14 0542  Gross per 24 hour  Intake    480 ml  Output   1000 ml  Net   -520 ml    Intake/Output this shift:    Labs:  Recent Labs  01/21/14 0430 01/22/14 0425  HGB 10.8* 9.3*    Recent Labs  01/21/14 0430 01/22/14 0425  WBC 8.5 10.9*  RBC 3.40* 2.98*  HCT 31.0* 26.9*  PLT 248 221    Recent Labs  01/21/14 0430 01/22/14 0425  NA 136* 134*  K 3.3* 3.8  CL 97 98  CO2 30 28  BUN 14 16  CREATININE 0.57 0.58  GLUCOSE 143* 143*  CALCIUM 8.6 8.8    Recent Labs  01/20/14 1315  INR 0.99    EXAM General - Patient is Alert, Appropriate and Oriented Extremity - Neurovascular intact Sensation intact distally Dressing/Incision - clean, dry, no drainage, healing Motor Function - intact, moving foot and toes well on exam.   Past Medical History  Diagnosis Date  . Hypertension   . Osteoporosis   . GERD (gastroesophageal reflux disease)   . Potassium (K) deficiency   . UTI (lower urinary tract infection)     LAST UTI MARCH 2015 ?  Marland Kitchen. Stroke     TIA - 20 SOME YRS AGO - STILL HAS TINGLING LEFT LEG - NO OTHER RESIDUALS  . Anxiety   . Dementia     STILL ABLE TO SIGN OWN LEGAL PAPERS  . Arthritis     C/O OF PAIN IN MIDDLE BACK - PAST HX OF FRACTURE /  OSTEOPOROSIS  . Recurrent falls     USES WALKER - [facility] UNTIL RECENT PROB WITH HIP DISLOCATION- [facility]  [phone]    Assessment/Plan: 2 Days Post-Op Procedure(s) (LRB): RIGHT HIP ACETABULAR REVISION  (Right) Principal Problem:   Dislocation of hip joint prosthesis Active Problems:   Postoperative anemia due to acute blood loss   Unspecified essential hypertension   GERD (gastroesophageal reflux disease)   Hypopotassemia  Estimated body mass index is 20.36 kg/(m^2) as calculated from the following:   Height as of this encounter: 5' (1.524 m).   Weight as of this encounter: 47.287 kg (104 lb 4 oz). Advance diet Up with therapy Plan for discharge tomorrow  DVT Prophylaxis - Aspirin Weight Bearing As Tolerated right Leg She [facility] but will consider the rehab unit for a short term before back home.  Loretta Garcia 01/22/2014, 7:09 AM

## 2014-01-22 NOTE — Progress Notes (Signed)
OT Cancellation Note  Patient Details Name: Arville GoJane W Kemple MRN: [id number] DOB: Oct 29, 1919   Cancelled Treatment:    Reason Eval/Treat Not Completed: Other (comment).  Noted pt was confused with PT earlier.  If she goes home, we will evaluate her; if she goes to SNF, we will defer to that venue as OT eval is not needed for admission.  Will check back tomorrow.    Marica OtterMaryellen Laker Thompson 01/22/2014, 11:42 AM Marica OtterMaryellen Analena Gama, OTR/L [phone] 01/22/2014

## 2014-01-22 NOTE — Progress Notes (Signed)
CSW assisting with d/c planning. Met with pt to offer support and assist with d/c planning. Pt has agreed to accept rehab placement at [facility] following hospital d/c. CSW will continue to follow to assist with d/c planning.  Werner Lean LCSW [phone]

## 2014-01-22 NOTE — Progress Notes (Signed)
Physical Therapy Treatment Patient Details Name: Loretta Garcia MRN: [id number] DOB: 1920-07-19 Today's Date: 01/22/2014    History of Present Illness 80 yo female s/p R hip acetabular revision with constrained liner 4/13. Hx of HTN, osteoporosis, dementia, anxiety, TIA, L THA, multiple dislocations/surgeries R hip. Pt is from [facility]    PT Comments    Pt tolerated exercises well.   Follow Up Recommendations  SNF;Supervision/Assistance - 24 hour     Equipment Recommendations  None recommended by PT    Recommendations for Other Services OT consult     Precautions / Restrictions Precautions Precautions: Posterior Hip;Fall Restrictions Weight Bearing Restrictions: No RLE Weight Bearing: Weight bearing as tolerated    Mobility  Bed Mobility                  Transfers                    Ambulation/Gait                 Stairs            Wheelchair Mobility    Modified Rankin (Stroke Patients Only)       Balance                                    Cognition Arousal/Alertness: Awake/alert Behavior During Therapy: WFL for tasks assessed/performed Overall Cognitive Status: Impaired/Different from baseline Area of Impairment: Memory Orientation Level: Disoriented to;Time;Situation;Place   Memory: Decreased short-term memory;Decreased recall of precautions              Exercises Total Joint Exercises Ankle Circles/Pumps: AROM;Both;15 reps;Supine Quad Sets: AROM;Both;15 reps;Supine Heel Slides: AAROM;Right;15 reps;Supine Hip ABduction/ADduction: AAROM;Right;15 reps;Supine    General Comments        Pertinent Vitals/Pain R hip 5/10 with activity    Home Living                      Prior Function            PT Goals (current goals can now be found in the care plan section) Progress towards PT goals: Progressing toward goals    Frequency  7X/week    PT Plan Current plan remains  appropriate    Co-evaluation             End of Session   Activity Tolerance: Patient tolerated treatment well Patient left: in bed;with call bell/phone within reach;with bed alarm set     Time: 1423-1433 PT Time Calculation (min): 10 min  Charges:  $Therapeutic Exercise: 8-22 mins                    G Codes:      Loretta Garcia, MPT Pager: [phone]

## 2014-01-22 NOTE — Progress Notes (Signed)
Physical Therapy Treatment Patient Details Name: Loretta Garcia MRN: [id number] DOB: 12-22-1919 Today's Date: 01/22/2014    History of Present Illness 80 yo female s/p R hip acetabular revision with constrained liner 4/13. Hx of HTN, osteoporosis, dementia, anxiety, TIA, L THA, multiple dislocations/surgeries R hip. Pt is from [facility]    PT Comments    Pt mobilizing better today however pt is confused and reports she is seeing water flowing and dripping from ceiling. Continue to recommend SNF.   Follow Up Recommendations  SNF;Supervision/Assistance - 24 hour     Equipment Recommendations  None recommended by PT    Recommendations for Other Services       Precautions / Restrictions Precautions Precautions: Posterior Hip;Fall Precaution Comments: Educated pt on/demonstrated posterior hip precautions Restrictions Weight Bearing Restrictions: No RLE Weight Bearing: Weight bearing as tolerated    Mobility  Bed Mobility Overal bed mobility: Needs Assistance Bed Mobility: Supine to Sit;Sit to Supine     Supine to sit: Mod assist Sit to supine: Mod assist   General bed mobility comments: Assist for trunk and bil LEs off bed. Increased time. Utillized bedpad for scooting, positioning.   Transfers Overall transfer level: Needs assistance Equipment used: Rolling walker (2 wheeled) Transfers: Sit to/from Stand Sit to Stand: Mod assist;From elevated surface Stand pivot transfers: Mod assist       General transfer comment: Assist to rise, stabilize, control descent, manevuer with RW. VCs safety, technique, hand placement  Ambulation/Gait Ambulation/Gait assistance: Min assist;+2 safety/equipment;+2 physical assistance Ambulation Distance (Feet): 100 Feet Assistive device: Rolling walker (2 wheeled) Gait Pattern/deviations: Step-through pattern;Decreased stride length;Trunk flexed     General Gait Details: Mod cueing for safety, posture, distance from walker. Improved  gait today. Pt able to tolerate activity better today. Assist to stabilize and maneuver with walker.    Stairs            Wheelchair Mobility    Modified Rankin (Stroke Patients Only)       Balance                                    Cognition Arousal/Alertness: Awake/alert   Overall Cognitive Status: Impaired/Different from baseline Area of Impairment: Memory;Safety/judgement;Orientation Orientation Level: Disoriented to;Place;Time;Situation   Memory: Decreased short-term memory;Decreased recall of precautions              Exercises      General Comments        Pertinent Vitals/Pain R hip with activity-unrated.     Home Living                      Prior Function            PT Goals (current goals can now be found in the care plan section) Progress towards PT goals: Progressing toward goals    Frequency  7X/week    PT Plan Current plan remains appropriate    Co-evaluation             End of Session Equipment Utilized During Treatment: Gait belt Activity Tolerance: Patient tolerated treatment well Patient left: in bed;with call bell/phone within reach;with family/visitor present     Time: [id number] PT Time Calculation (min): 14 min  Charges:  $Gait Training: 8-22 mins                    G Codes:  Weston Anna, MPT Pager: [phone]

## 2014-01-23 DIAGNOSIS — J690 Pneumonitis due to inhalation of food and vomit: Secondary | ICD-10-CM

## 2014-01-23 DIAGNOSIS — K219 Gastro-esophageal reflux disease without esophagitis: Secondary | ICD-10-CM

## 2014-01-23 DIAGNOSIS — R1319 Other dysphagia: Secondary | ICD-10-CM | POA: Diagnosis not present

## 2014-01-23 DIAGNOSIS — R933 Abnormal findings on diagnostic imaging of other parts of digestive tract: Secondary | ICD-10-CM

## 2014-01-23 LAB — BASIC METABOLIC PANEL
BUN: 14 mg/dL (ref 6–23)
CALCIUM: 8.5 mg/dL (ref 8.4–10.5)
CO2: 27 mEq/L (ref 19–32)
Chloride: 100 mEq/L (ref 96–112)
Creatinine, Ser: 0.55 mg/dL (ref 0.50–1.10)
GFR calc Af Amer: >90 mL/min (ref >90)
GFR, EST NON AFRICAN AMERICAN: 80 mL/min — AB (ref >90)
GLUCOSE: 150 mg/dL — AB (ref 70–99)
POTASSIUM: 3.8 meq/L (ref 3.7–5.3)
Sodium: 138 mEq/L (ref 137–147)

## 2014-01-23 LAB — CBC
HCT: 28.4 % — ABNORMAL LOW (ref 36.0–46.0)
Hemoglobin: 9.7 g/dL — ABNORMAL LOW (ref 12.0–15.0)
MCH: 31.2 pg (ref 26.0–34.0)
MCHC: 34.2 g/dL (ref 30.0–36.0)
MCV: 91.3 fL (ref 78.0–100.0)
PLATELETS: 233 10*3/uL (ref 150–400)
RBC: 3.11 MIL/uL — ABNORMAL LOW (ref 3.87–5.11)
RDW: 14.4 % (ref 11.5–15.5)
WBC: 10.1 10*3/uL (ref 4.0–10.5)

## 2014-01-23 MED ORDER — DEXTROSE 5 % IV SOLN
2.0000 g | Freq: Every day | INTRAVENOUS | Status: DC
  Administered 2014-01-23 – 2014-01-25 (×4): 2 g via INTRAVENOUS
  Filled 2014-01-23 (×5): qty 2

## 2014-01-23 MED ORDER — VANCOMYCIN HCL IN DEXTROSE 750-5 MG/150ML-% IV SOLN
750.0000 mg | Freq: Every day | INTRAVENOUS | Status: DC
  Administered 2014-01-23 – 2014-01-25 (×4): 750 mg via INTRAVENOUS
  Filled 2014-01-23 (×5): qty 150

## 2014-01-23 MED ORDER — PANTOPRAZOLE SODIUM 40 MG IV SOLR
40.0000 mg | INTRAVENOUS | Status: DC
  Administered 2014-01-23 – 2014-01-26 (×4): 40 mg via INTRAVENOUS
  Filled 2014-01-23 (×4): qty 40

## 2014-01-23 NOTE — Progress Notes (Signed)
   Subjective: 3 Days Post-Op Procedure(s) (LRB): RIGHT HIP ACETABULAR REVISION  (Right) Patient reports pain as mild.   Patient seen in rounds with Dr. Lequita HaltAluisio.  Sitter in room. Patient is had problems thru the night.  Epsiode of N/V.  Questionalble aspiration. CT scan ordered due to hypoxia.  Scan showed bilateral PMN. Plan is to go Nursing Home after hospital stay.  Objective: Vital signs in last 24 hours: Temp:  [97.2 F (36.2 C)-100.6 F (38.1 C)] 97.2 F (36.2 C) (04/16 0506) Pulse Rate:  [79-114] 82 (04/16 0506) Resp:  [16-18] 16 (04/16 0506) BP: (115-171)/(68-84) 115/68 mmHg (04/16 0506) SpO2:  [82 %-96 %] 93 % (04/16 0506)  Intake/Output from previous day:  Intake/Output Summary (Last 24 hours) at 01/23/14 0720 Last data filed at 01/22/14 1320  Gross per 24 hour  Intake    120 ml  Output    400 ml  Net   -280 ml    Labs:  Recent Labs  01/21/14 0430 01/22/14 0425 01/23/14 0205  HGB 10.8* 9.3* 9.7*    Recent Labs  01/22/14 0425 01/23/14 0205  WBC 10.9* 10.1  RBC 2.98* 3.11*  HCT 26.9* 28.4*  PLT 221 233    Recent Labs  01/22/14 0425 01/23/14 0205  NA 134* 138  K 3.8 3.8  CL 98 100  CO2 28 27  BUN 16 14  CREATININE 0.58 0.55  GLUCOSE 143* 150*  CALCIUM 8.8 8.5    Recent Labs  01/20/14 1315  INR 0.99    EXAM General - Patient is Alert and Confused Extremity - Neurovascular intact Sensation intact distally Dressing/Incision - clean, dry, no drainage, healing Motor Function - intact, moving foot and toes well on exam.   Past Medical History  Diagnosis Date  . Hypertension   . Osteoporosis   . GERD (gastroesophageal reflux disease)   . Potassium (K) deficiency   . UTI (lower urinary tract infection)     LAST UTI MARCH 2015 ?  Marland Kitchen. Stroke     TIA - 20 SOME YRS AGO - STILL HAS TINGLING LEFT LEG - NO OTHER RESIDUALS  . Anxiety   . Dementia     STILL ABLE TO SIGN OWN LEGAL PAPERS  . Arthritis     C/O OF PAIN IN MIDDLE BACK - PAST  HX OF FRACTURE / OSTEOPOROSIS  . Recurrent falls     USES WALKER - [facility] UNTIL RECENT PROB WITH HIP DISLOCATION- [facility] CURRENT[facility]  [phone]    Assessment/Plan: 3 Days Post-Op Procedure(s) (LRB): RIGHT HIP ACETABULAR REVISION  (Right) Principal Problem:   Dislocation of hip joint prosthesis Active Problems:   Postoperative anemia due to acute blood loss   Unspecified essential hypertension   GERD (gastroesophageal reflux disease)   Hypopotassemia   Aspiration pneumonia  Estimated body mass index is 20.36 kg/(m^2) as calculated from the following:   Height as of this encounter: 5' (1.524 m).   Weight as of this encounter: 47.287 kg (104 lb 4 oz). Discharge to SNF when medically stable. Appreciate Medical consult. Started on IV ABX Vancomycin trough level 15-20 mcg/ml  Cefepime dosed based on patient weight and renal function  DVT Prophylaxis - Aspirin Weight Bearing As Tolerated righ Leg Will need to [facility].  Alexzandrew Perkins 01/23/2014, 7:20 AM

## 2014-01-23 NOTE — Progress Notes (Signed)
ANTIBIOTIC CONSULT NOTE - INITIAL  Pharmacy Consult for Cefepime, vancomycin Indication: Aspiration Pneumonia  Allergies  Allergen Reactions  . Altace [Ramipril]     Per MAR  . Calcitonin     Per MAR  . Cardura [Doxazosin Mesylate]     Per MAR  . Fosamax [Alendronate Sodium]     Per MAR  . Penicillins     Per MAR  . Tramadol     Per MAR  . Codeine Other (See Comments)    unknown  . Darvocet [Propoxyphene N-Acetaminophen] Other (See Comments)    unknown  . Plavix [Clopidogrel Bisulfate] Other (See Comments)    unsure    Patient Measurements: Height: 5' (152.4 cm) Weight: 104 lb 4 oz (47.287 kg) IBW/kg (Calculated) : 45.5 Adjusted Body Weight:   Vital Signs: Temp: 97.9 F (36.6 C) (04/15 1435) Temp src: Oral (04/15 2151) BP: 171/84 mmHg (04/15 2151) Pulse Rate: 114 (04/15 2151) Intake/Output from previous day: 04/15 0701 - 04/16 0700 In: 120 [P.O.:120] Out: 400 [Urine:400] Intake/Output from this shift:    Labs:  Recent Labs  01/20/14 1315 01/21/14 0430 01/22/14 0425  WBC  --  8.5 10.9*  HGB  --  10.8* 9.3*  PLT  --  248 221  CREATININE 0.57 0.57 0.58   Estimated Creatinine Clearance: 32.9 ml/min (by C-G formula based on Cr of 0.58). No results found for this basename: VANCOTROUGH, Leodis BinetVANCOPEAK, VANCORANDOM, GENTTROUGH, GENTPEAK, GENTRANDOM, TOBRATROUGH, TOBRAPEAK, TOBRARND, AMIKACINPEAK, AMIKACINTROU, AMIKACIN,  in the last 72 hours   Microbiology: Recent Results (from the past 720 hour(s))  SURGICAL PCR SCREEN     Status: None   Collection Time    01/20/14 12:56 PM      Result Value Ref Range Status   MRSA, PCR NEGATIVE  NEGATIVE Final   Staphylococcus aureus NEGATIVE  NEGATIVE Final   Comment:            The Xpert SA Assay (FDA     approved for NASAL specimens     in patients over 78 years of age),     is one component of     a comprehensive surveillance     program.  Test performance has     been validated by [facility] for patients  greater     than or equal to 78 year old.     It is not intended     to diagnose infection nor to     guide or monitor treatment.    Medical History: Past Medical History  Diagnosis Date  . Hypertension   . Osteoporosis   . GERD (gastroesophageal reflux disease)   . Potassium (K) deficiency   . UTI (lower urinary tract infection)     LAST UTI MARCH 2015 ?  Marland Kitchen. Stroke     TIA - 20 SOME YRS AGO - STILL HAS TINGLING LEFT LEG - NO OTHER RESIDUALS  . Anxiety   . Dementia     STILL ABLE TO SIGN OWN LEGAL PAPERS  . Arthritis     C/O OF PAIN IN MIDDLE BACK - PAST HX OF FRACTURE / OSTEOPOROSIS  . Recurrent falls     USES WALKER - [facility]IL RECENT PROB WITH HIP DISLOCATION- [facility]  [phone]    Medications:  Anti-infectives   Start     Dose/Rate Route Frequency Ordered Stop   01/23/14 0115  ceFEPIme (MAXIPIME) 2 g in  dextrose 5 % 50 mL IVPB     2 g 100 mL/hr over 30 Minutes Intravenous Daily at bedtime 01/23/14 0104     01/23/14 0115  vancomycin (VANCOCIN) IVPB 750 mg/150 ml premix     750 mg 150 mL/hr over 60 Minutes Intravenous Daily at bedtime 01/23/14 0106     01/21/14 0600  vancomycin (VANCOCIN) IVPB 1000 mg/200 mL premix     1,000 mg 200 mL/hr over 60 Minutes Intravenous Every 12 hours 01/20/14 2132 01/21/14 0642   01/20/14 1730  vancomycin (VANCOCIN) IVPB 1000 mg/200 mL premix     1,000 mg 200 mL/hr over 60 Minutes Intravenous  Once 01/20/14 1722 01/20/14 1824   01/20/14 1315  ceFAZolin (ANCEF) IVPB 2 g/50 mL premix  Status:  Discontinued     2 g 100 mL/hr over 30 Minutes Intravenous On call to O.R. 01/20/14 1255 01/20/14 1722     Assessment: Patient with Aspiration Pneumonia.  PCN allergy noted, MD after contacted changed to cefepime per pharmacy.  Patient with poor renal function.  Goal of Therapy:  Vancomycin trough level 15-20 mcg/ml Cefepime dosed based on patient weight and renal  function   Plan:  Measure antibiotic drug levels at steady state Follow up culture results Vancomycin 750mg  iv q24hr, cefepime 2gm iv q24hr  Tressie EllisJulian Crowford Darlina GuysGrimsley Jr. 01/23/2014,1:07 AM

## 2014-01-23 NOTE — Progress Notes (Signed)
Patient seen and evaluated earlier this am by my associate. Please refer to his H and P regarding assessment and plan.  Consulted GI per speech therapy recommendations.  Made patient NPO.  Recommending patient take her aspirin and zoloft. Other medications will be held or changed to IV. Discussed with nursing.  [employer]lando Bisma Klett

## 2014-01-23 NOTE — Progress Notes (Signed)
Physical Therapy Treatment Patient Details Name: Arville GoJane W Reuter MRN: [id number] DOB: 11-11-1919 Today's Date: 01/23/2014    History of Present Illness 80 yo female s/p R hip acetabular revision with constrained liner 4/13. Hx of HTN, osteoporosis, dementia, anxiety, TIA, L THA, multiple dislocations/surgeries R hip. Pt is from [facility]    PT Comments    Progressing slowly with mobility. Continue to recommend SNF.   Follow Up Recommendations  SNF;Supervision/Assistance - 24 hour     Equipment Recommendations  None recommended by PT    Recommendations for Other Services       Precautions / Restrictions Precautions Precautions: Posterior Hip;Fall Precaution Comments: Educated pt on/demonstrated posterior hip precautions Restrictions Weight Bearing Restrictions: No RLE Weight Bearing: Weight bearing as tolerated    Mobility  Bed Mobility               General bed mobility comments: pt sitting in recliner  Transfers Overall transfer level: Needs assistance Equipment used: Rolling walker (2 wheeled) Transfers: Sit to/from Stand Sit to Stand: Min assist         General transfer comment: Assist to rise, stabilize, control descent, manevuer with RW. VCs safety, technique, hand placement  Ambulation/Gait Ambulation/Gait assistance: Mod assist Ambulation Distance (Feet): 100 Feet Assistive device: Rolling walker (2 wheeled) Gait Pattern/deviations: Step-through pattern;Decreased stride length;Decreased step length - right;Trunk flexed;Antalgic     General Gait Details: Mod cueing for safety, posture, distance from walker. Improved gait today. Pt able to tolerate activity better today. Assist to stabilize and maneuver with walker.    Stairs            Wheelchair Mobility    Modified Rankin (Stroke Patients Only)       Balance                                    Cognition Arousal/Alertness: Awake/alert Behavior During Therapy: WFL  for tasks assessed/performed   Area of Impairment: Memory;Orientation Orientation Level: Disoriented to;Place;Time;Situation   Memory: Decreased recall of precautions;Decreased short-term memory              Exercises Total Joint Exercises Ankle Circles/Pumps: AROM;Both;15 reps;Seated Quad Sets: AROM;10 reps;Seated Hip ABduction/ADduction: AAROM;Right;10 reps;Seated    General Comments        Pertinent Vitals/Pain R LE-"hurts"-pt unable to rate    Home Living                      Prior Function            PT Goals (current goals can now be found in the care plan section) Progress towards PT goals: Progressing toward goals    Frequency  7X/week    PT Plan Current plan remains appropriate    Co-evaluation             End of Session Equipment Utilized During Treatment: Gait belt Activity Tolerance: Patient limited by pain;Patient limited by fatigue Patient left: in chair;with call bell/phone within reach;with nursing/sitter in room     Time: 1145-1204 PT Time Calculation (min): 19 min  Charges:  $Gait Training: 8-22 mins                    G Codes:      Rebeca AlertJannie Hermie Reagor, MPT Pager: [phone]

## 2014-01-23 NOTE — Progress Notes (Signed)
Pt at 2151 noted to have  little episodes of vomiting light pink vomitus.  Pt started to shiver with body temperature warm to touch. Vitals signs abnl with elevated temp of 100.6 orally  , tachycardic with HR of 114 and O2 sats of 82% on ra. 2 liters of O2 applied but sats lingered at 87%. O2 then increased to 3l where pt went up to btw 90%-91%. IV fluids resumed per previous order.  PA on call made aware of patient's condition. New orders given. Orders implemented. Patient at the beginning of shift was confused with an orientation to self only. Over night pt's mental status had improved with orientation of self and place. Resting in bed at the moment with safety sitter at bedside. Ofilia Neasvwilliams,rn

## 2014-01-23 NOTE — Consult Note (Signed)
Consultation  Referring Provider:  [facility] Primary Care Physician:  Londell MohPHARR,WALTER DAVIDSON, MD Primary Gastroenterologist:  none     Reason for Consultation:  dysphagia         HPI:   Loretta Garcia is a 80 y.o. female with right hip discolation, s/p right hip acetabular revision 01/20/14. Patient was nearing discharge when she became hypoxic / febrile. . CTscan done and negative for PE but revealed bilateral PNA.  possibly aspiration. The esophagus was markedly dilated and fluid-filled SLP evaluated this am.    Difficult to obtain history from patient as she is very sleepy this am and having periods of confusion.. Patient thinks swallowing problems started a few days prior to this admission. Feels like food nor fluids go down. She endorse pain with swallowing. I spoke with daughter [name] and patient has had intermittent dysphagia for years. It sounds like she has had 1 or 2 esophageal dilations in the past but there were not any recent significant problems with her swallowing. Patient was living alone, weight stable.     Past Medical History  Diagnosis Date  . Hypertension   . Osteoporosis   . GERD (gastroesophageal reflux disease)   . Potassium (K) deficiency   . UTI (lower urinary tract infection)     LAST UTI MARCH 2015 ?  Marland Kitchen. Stroke     TIA - 20 SOME YRS AGO - STILL HAS TINGLING LEFT LEG - NO OTHER RESIDUALS  . Anxiety   . Dementia     STILL ABLE TO SIGN OWN LEGAL PAPERS  . Arthritis     C/O OF PAIN IN MIDDLE BACK - PAST HX OF FRACTURE / OSTEOPOROSIS  . Recurrent falls     USES WALKER [facility]NTIL RECENT PROB WITH HIP DISLOCATION- [facility]  [phone]    Past Surgical History  Procedure Laterality Date  . Joint replacement      RIGHT HIP REPLACEMENT AND REVISIONS; LEFT TOTAL HIP REPLACEMENT AND REVISION.  Marland Kitchen. Eye surgery      SURGERY FOR DETACHED RETINA  . Total hip revision Right  01/20/2014    Procedure: RIGHT HIP ACETABULAR REVISION ;  Surgeon: Loanne DrillingFrank V Aluisio, MD;  Location: [facility];  Service: Orthopedics;  Laterality: Right;     [facility]: daughter had colon caner  History  Substance Use Topics  . Smoking status: Never Smoker   . Smokeless tobacco: Never Used  . Alcohol Use: No    Prior to Admission medications   Medication Sig Start Date End Date Taking? Authorizing Provider  acetaminophen (TYLENOL) 500 MG tablet Take 1,000 mg by mouth 3 (three) times daily.   Yes Historical Provider, MD  APPLE CIDER VINEGAR PO Take 5 mLs by mouth every evening.    Yes Historical Provider, MD  aspirin EC 81 MG tablet Take 81 mg by mouth daily.   Yes Historical Provider, MD  BIOTIN 5000 PO Take 5,000 mcg by mouth daily.   Yes Historical Provider, MD  Calcium Citrate-Vitamin D (CALCIUM CITRATE + D) 315-250 MG-UNIT TABS Take 2 tablets by mouth 2 (two) times daily.   Yes Historical Provider, MD  CRANBERRY-VITAMIN C PO Take 1 tablet by mouth daily.   Yes Historical Provider, MD  Cyanocobalamin 2500 MCG CHEW Chew 2,500 mcg by mouth daily.   Yes Historical Provider, MD  indapamide (LOZOL) 1.25 MG tablet Take 1.25 mg by mouth every morning.   Yes  Historical Provider, MD  Melatonin 5 MG TABS Take 5 mg by mouth at bedtime as needed (Sleep).   Yes Historical Provider, MD  Multiple Vitamin (MULTIVITAMIN WITH MINERALS) TABS tablet Take 1 tablet by mouth daily.   Yes Historical Provider, MD  ofloxacin (OCUFLOX) 0.3 % ophthalmic solution Place 1 drop into the right eye 2 (two) times daily.   Yes Historical Provider, MD  polyethylene glycol (MIRALAX / GLYCOLAX) packet Take 17 g by mouth every other day.   Yes Historical Provider, MD  Potassium Chloride CR (MICRO-K) 8 MEQ CPCR capsule CR Take 8 mEq by mouth daily.   Yes Historical Provider, MD  sertraline (ZOLOFT) 50 MG tablet Take 50 mg by mouth every morning.   Yes Historical Provider, MD  vitamin C (ASCORBIC ACID) 500 MG tablet Take 500 mg by  mouth daily.   Yes Historical Provider, MD    Current Facility-Administered Medications  Medication Dose Route Frequency Provider Last Rate Last Dose  . acetaminophen (TYLENOL) tablet 650 mg  650 mg Oral Q6H PRN Loanne Drilling, MD   325 mg at 01/22/14 0028   Or  . acetaminophen (TYLENOL) suppository 650 mg  650 mg Rectal Q6H PRN Loanne Drilling, MD   650 mg at 01/22/14 2327  . aspirin EC tablet 325 mg  325 mg Oral Q breakfast Loanne Drilling, MD   325 mg at 01/22/14 0831  . bisacodyl (DULCOLAX) suppository 10 mg  10 mg Rectal Daily PRN Loanne Drilling, MD      . ceFEPIme (MAXIPIME) 2 g in dextrose 5 % 50 mL IVPB  2 g Intravenous QHS Loanne Drilling, MD   2 g at 01/23/14 0218  . dextrose 5 % and 0.9 % NaCl with KCl 20 mEq/L infusion   Intravenous Continuous Loanne Drilling, MD 75 mL/hr at 01/22/14 2327    . diphenhydrAMINE (BENADRYL) 12.5 MG/5ML elixir 12.5-25 mg  12.5-25 mg Oral Q4H PRN Loanne Drilling, MD      . docusate sodium (COLACE) capsule 100 mg  100 mg Oral BID Loanne Drilling, MD   100 mg at 01/22/14 2331  . gi cocktail (Maalox,Lidocaine,Donnatal)  30 mL Oral TID PRN Loanne Drilling, MD   30 mL at 01/22/14 1752  . HYDROmorphone (DILAUDID) injection 0.5 mg  0.5 mg Intravenous Q2H PRN Loanne Drilling, MD      . HYDROmorphone (DILAUDID) tablet 2 mg  2 mg Oral Q4H PRN Loanne Drilling, MD   2 mg at 01/21/14 0911  . indapamide (LOZOL) tablet 1.25 mg  1.25 mg Oral q morning - 10a Loanne Drilling, MD   1.25 mg at 01/22/14 1017  . iron polysaccharides (NIFEREX) capsule 150 mg  150 mg Oral Daily Alexzandrew Perkins, PA-C   150 mg at 01/22/14 1017  . menthol-cetylpyridinium (CEPACOL) lozenge 3 mg  1 lozenge Oral PRN Loanne Drilling, MD       Or  . phenol (CHLORASEPTIC) mouth spray 1 spray  1 spray Mouth/Throat PRN Loanne Drilling, MD   1 spray at 01/22/14 1818  . methocarbamol (ROBAXIN) tablet 500 mg  500 mg Oral Q6H PRN Loanne Drilling, MD   500 mg at 01/21/14 1610   Or  . methocarbamol  (ROBAXIN) 500 mg in dextrose 5 % 50 mL IVPB  500 mg Intravenous Q6H PRN Loanne Drilling, MD   500 mg at 01/20/14 2101  . metoCLOPramide (REGLAN) tablet 5-10 mg  5-10 mg Oral  Q8H PRN Loanne Drilling, MD       Or  . metoCLOPramide (REGLAN) injection 5-10 mg  5-10 mg Intravenous Q8H PRN Loanne Drilling, MD      . ofloxacin (OCUFLOX) 0.3 % ophthalmic solution 1 drop  1 drop Right Eye BID Loanne Drilling, MD   1 drop at 01/22/14 2332  . ondansetron (ZOFRAN) tablet 4 mg  4 mg Oral Q6H PRN Loanne Drilling, MD   4 mg at 01/22/14 1752   Or  . ondansetron [facility]) injection 4 mg  4 mg Intravenous Q6H PRN Loanne Drilling, MD   4 mg at 01/23/14 0856  . pantoprazole (PROTONIX) EC tablet 40 mg  40 mg Oral Daily Loanne Drilling, MD   40 mg at 01/22/14 1723  . polyethylene glycol (MIRALAX / GLYCOLAX) packet 17 g  17 g Oral QODAY Loanne Drilling, MD      . polyethylene glycol (MIRALAX / GLYCOLAX) packet 17 g  17 g Oral Daily PRN Loanne Drilling, MD   17 g at 01/21/14 0907  . potassium chloride SA (K-DUR,KLOR-CON) CR tablet 40 mEq  40 mEq Oral Daily Alexzandrew Perkins, PA-C   40 mEq at 01/22/14 1018  . sertraline (ZOLOFT) tablet 50 mg  50 mg Oral q morning - 10a Loanne Drilling, MD   50 mg at 01/22/14 1018  . vancomycin (VANCOCIN) IVPB 750 mg/150 ml premix  750 mg Intravenous QHS Loanne Drilling, MD   750 mg at 01/23/14 0310    Allergies as of 01/17/2014 - Review Complete 01/17/2014  Allergen Reaction Noted  . Altace [ramipril]  01/17/2014  . Calcitonin  01/17/2014  . Cardura [doxazosin mesylate]  01/17/2014  . Fosamax [alendronate sodium]  01/17/2014  . Penicillins  01/17/2014  . Tramadol  01/17/2014  . Codeine Other (See Comments) 07/06/2012  . Darvocet [propoxyphene n-acetaminophen] Other (See Comments) 07/06/2012  . Plavix [clopidogrel bisulfate] Other (See Comments) 07/06/2012    Review of Systems:    All systems reviewed and negative except where noted in HPI.   Physical Exam:  Vital signs in  last 24 hours: Temp:  [97.2 F (36.2 C)-100.6 F (38.1 C)] 97.2 F (36.2 C) (04/16 0506) Pulse Rate:  [79-114] 82 (04/16 0506) Resp:  [16-18] 16 (04/16 0506) BP: (115-171)/(68-84) 115/68 mmHg (04/16 0506) SpO2:  [82 %-96 %] 93 % (04/16 0506) Last BM Date:  (unknown) General:   Pleasant white female in NAD Head:  Normocephalic and atraumatic. Eyes:   No icterus.   Conjunctiva pink. Ears:  Normal auditory acuity. Neck:  Supple; no masses felt Lungs:  Respirations even and unlabored. Decreased breath sounds bilateral bases.   Heart:  Regular rate and rhythm Abdomen:  Soft, nondistended, nontender. Normal bowel sounds. No appreciable masses .  Rectal:  Not performed.  Msk:  Symmetrical without gross deformities.  Extremities:  Without edema. Neurologic:  Alert ;  grossly normal neurologically. She is very sleepy, keeps drifting off.  Skin:  Intact without significant lesions or rashes. Cervical Nodes:  No significant cervical adenopathy. Psych:  Alert and cooperative. Normal affect.  LAB RESULTS:  Recent Labs  01/21/14 0430 01/22/14 0425 01/23/14 0205  WBC 8.5 10.9* 10.1  HGB 10.8* 9.3* 9.7*  HCT 31.0* 26.9* 28.4*  PLT 248 221 233   BMET  Recent Labs  01/21/14 0430 01/22/14 0425 01/23/14 0205  NA 136* 134* 138  K 3.3* 3.8 3.8  CL 97 98 100  CO2 30 28  27  GLUCOSE 143* 143* 150*  BUN 14 16 14   CREATININE 0.57 0.58 0.55  CALCIUM 8.6 8.8 8.5   LFT  Recent Labs  01/20/14 1315  PROT 6.1  ALBUMIN 2.9*  AST 20  ALT 13  ALKPHOS 62  BILITOT 0.3   PT/INR  Recent Labs  01/20/14 1315  LABPROT 12.9  INR 0.99    STUDIES: Ct Angio Chest Pe W/cm &/or Wo Cm  01/22/2014   CLINICAL DATA:  Short of breath.  Confusion.  EXAM: CT ANGIOGRAPHY CHEST WITH CONTRAST  TECHNIQUE: Multidetector CT imaging of the chest was performed using the standard protocol during bolus administration of intravenous contrast. Multiplanar CT image reconstructions and MIPs were obtained to  evaluate the vascular anatomy.  CONTRAST:  100mL OMNIPAQUE IOHEXOL 350 MG/ML SOLN  COMPARISON:  07/06/2012  FINDINGS: Pulmonary arterial opacification is good. There are no pulmonary emboli. There is atherosclerosis of the aorta but no aneurysm or dissection.  There is bilateral dependent lower lobe pneumonia suggesting aspiration. There is a small amount of pleural fluid on the left. The esophagus is markedly dilated and fluid-filled.  No mediastinal mass or adenopathy. The non involved portions of the lung are clear and normal.  There is a compression fracture at T12 which is old. No acute bony finding.  Review of the MIP images confirms the above findings.  IMPRESSION: No pulmonary emboli.  Bilateral dependent lower lobe bronchopneumonia, probably due to aspiration. Dilated, fluid-filled esophagus.   Electronically Signed   By: Paulina FusiMark  Shogry M.D.   On: 01/22/2014 23:34   PREVIOUS ENDOSCOPIES:            possibly 2  esophageal dilations in the past (last one 5 + years ago) per daughter.   EGD in 2004 in Epic by Dr. Virginia Rochesterrr was entirely normal with a dilation performed.   Impression / Plan:   691. 80 year old female with right hip discolation, s/p right acetabular revision 01/20/14.  2. Acute on chronic (intermittent) dysphagia.  She is coughing and regurgitating with meals. Now with probable aspiration PNA.  CTscan does show fluid filled, dilated esophagus. SLP evaluated and feels dysphagia primarily esophageal.  Patient was dilated empirically (maybe twice) several years ago by Dr. Virginia Rochesterrr. She has not complained of recurrent dysphagia until this hospitalization and weight has been stable.   For further evaluation patient will be scheduled for EGD with possible dilation. We will need phone consent from daughter. Continue PPI.   3. [place] anemia, ? Post op?  She is on oral iron.   4. Low grade temp (100.6), probably related to aspiration PNA. On antibiotics.   Thanks   LOS: 3 days   Meredith Pelaula M Guenther   01/23/2014, 10:19 AM     Attending physician's note   I have taken a history, examined the patient and reviewed the chart. I agree with the Advanced Practitioner's note, impression and recommendations. Recurrent dysphagia in the past and fluid filled esophagus on CT imaging with possible aspiration pneumonia. Suspect she has a chronic esophageal motility disorder and/or GERD. EGD tomorrow to R/O a stricture, esophagitis, etc.   Meryl DareMalcolm T Junious Ragone, MD [facility]

## 2014-01-23 NOTE — Consult Note (Addendum)
[facility] Medical Consultation  Loretta Garcia ZOX:[id number] DOB: 27-Jul-1920 DOA: 01/20/2014 PCP: [name], MD   Requesting physician: Dr Rubye Oaks Date of consultation: January 23, 2014 Reason for consultation: Pneumonia  Impression/Recommendations Principal Problem:   Dislocation of hip joint prosthesis Active Problems:   Postoperative anemia due to acute blood loss   Unspecified essential hypertension   GERD (gastroesophageal reflux disease)   Hypopotassemia    1. Pneumonia -Patient likely has an aspiration event -will start on cefepime at this time -will also give her a dose of vancomycin -swallow study will get SLP -blood cultures drawn  2. GERD -may be contributing to aspiration -elevate bed -PPIs  3. Hypertension -blood pressure elevated presently -will need medications re-evaluated    Chief Complaint: Pneumonia  HPI:  40 yow female who was admitted for a hip dislocation. Patient underwent right hip acetabular revision on 4/13. Patient was noted to be more confused and agitated and there was also concern that she may be aspirating. Today she was noted to be hypoxic and was sent for a CT scan to rule out Pulmonary embolism. On the Scan however was found bilateral pneumonia. We are asked to see her for this. She is not able to provide any other history due to her confusion.  Review of Systems:  Not able to elicit  Past Medical History  Diagnosis Date  . Hypertension   . Osteoporosis   . GERD (gastroesophageal reflux disease)   . Potassium (K) deficiency   . UTI (lower urinary tract infection)     LAST UTI MARCH 2015 ?  Marland Kitchen Stroke     TIA - 20 SOME YRS AGO - STILL HAS TINGLING LEFT LEG - NO OTHER RESIDUALS  . Anxiety   . Dementia     STILL ABLE TO SIGN OWN LEGAL PAPERS  . Arthritis     C/O OF PAIN IN MIDDLE BACK - PAST HX OF FRACTURE / OSTEOPOROSIS  . Recurrent falls     USES WALKER - LIVING INDEPENDENTLY IN AP[facility]MENT [facility]  UNTIL RECENT PROB WITH HIP DISLOCATION- IS CURRENTLY AT [facility] C[facility]  [phone]   Past Surgical History  Procedure Laterality Date  . Joint replacement      RIGHT HIP REPLACEMENT AND REVISIONS; LEFT TOTAL HIP REPLACEMENT AND REVISION.  Marland Kitchen Eye surgery      SURGERY FOR DETACHED RETINA  . Total hip revision Right 01/20/2014    Procedure: RIGHT HIP ACETABULAR REVISION ;  Surgeon: Loanne Drilling, MD;  Location: [facility];  Service: Orthopedics;  Laterality: Right;   Social History:  reports that she has never smoked. She has never used smokeless tobacco. She reports that she does not drink alcohol or use illicit drugs.  Allergies  Allergen Reactions  . Altace [Ramipril]     Per MAR  . Calcitonin     Per MAR  . Cardura [Doxazosin Mesylate]     Per MAR  . Fosamax [Alendronate Sodium]     Per MAR  . Penicillins     Per MAR  . Tramadol     Per MAR  . Codeine Other (See Comments)    unknown  . Darvocet [Propoxyphene N-Acetaminophen] Other (See Comments)    unknown  . Plavix [Clopidogrel Bisulfate] Other (See Comments)    unsure   History reviewed. No pertinent family history.  Prior to Admission medications   Medication Sig Start Date End Date Taking? Authorizing Provider  acetaminophen (TYLENOL) 500 MG tablet Take 1,000  mg by mouth 3 (three) times daily.   Yes Historical Provider, MD  APPLE CIDER VINEGAR PO Take 5 mLs by mouth every evening.    Yes Historical Provider, MD  aspirin EC 81 MG tablet Take 81 mg by mouth daily.   Yes Historical Provider, MD  BIOTIN 5000 PO Take 5,000 mcg by mouth daily.   Yes Historical Provider, MD  Calcium Citrate-Vitamin D (CALCIUM CITRATE + D) 315-250 MG-UNIT TABS Take 2 tablets by mouth 2 (two) times daily.   Yes Historical Provider, MD  CRANBERRY-VITAMIN C PO Take 1 tablet by mouth daily.   Yes Historical Provider, MD  Cyanocobalamin 2500 MCG CHEW Chew 2,500 mcg by mouth daily.   Yes Historical Provider, MD  indapamide (LOZOL) 1.25 MG  tablet Take 1.25 mg by mouth every morning.   Yes Historical Provider, MD  Melatonin 5 MG TABS Take 5 mg by mouth at bedtime as needed (Sleep).   Yes Historical Provider, MD  Multiple Vitamin (MULTIVITAMIN WITH MINERALS) TABS tablet Take 1 tablet by mouth daily.   Yes Historical Provider, MD  ofloxacin (OCUFLOX) 0.3 % ophthalmic solution Place 1 drop into the right eye 2 (two) times daily.   Yes Historical Provider, MD  polyethylene glycol (MIRALAX / GLYCOLAX) packet Take 17 g by mouth every other day.   Yes Historical Provider, MD  Potassium Chloride CR (MICRO-K) 8 MEQ CPCR capsule CR Take 8 mEq by mouth daily.   Yes Historical Provider, MD  sertraline (ZOLOFT) 50 MG tablet Take 50 mg by mouth every morning.   Yes Historical Provider, MD  vitamin C (ASCORBIC ACID) 500 MG tablet Take 500 mg by mouth daily.   Yes Historical Provider, MD   Physical Exam: Blood pressure 171/84, pulse 114, temperature 97.9 F (36.6 C), temperature source Oral, resp. rate 18, height 5' (1.524 m), weight 47.287 kg (104 lb 4 oz), SpO2 91.00%. Filed Vitals:   01/22/14 2151  BP: 171/84  Pulse: 114  Temp:   Resp: 18     General:  Comfortable no distress  Eyes: EOMI PERRLA  ENT: mouth clear no nasal discharge  Neck: supple no JVP  Cardiovascular: s1s2 normal RRR  Respiratory: good air entry few basal ronchi noted  Abdomen: soft non tender  Skin: no rash noted  Psychiatric: confused but pleasent  Neurologic: grosslu intact  Labs on Admission:  Basic Metabolic Panel:  Recent Labs Lab 01/20/14 1315 01/21/14 0430 01/22/14 0425  NA 138 136* 134*  K 3.0* 3.3* 3.8  CL 96 97 98  CO2 30 30 28   GLUCOSE 82 143* 143*  BUN 17 14 16   CREATININE 0.57 0.57 0.58  CALCIUM 9.9 8.6 8.8   Liver Function Tests:  Recent Labs Lab 01/20/14 1315  AST 20  ALT 13  ALKPHOS 62  BILITOT 0.3  PROT 6.1  ALBUMIN 2.9*   No results found for this basename: LIPASE, AMYLASE,  in the last 168 hours No results  found for this basename: AMMONIA,  in the last 168 hours CBC:  Recent Labs Lab 01/21/14 0430 01/22/14 0425  WBC 8.5 10.9*  HGB 10.8* 9.3*  HCT 31.0* 26.9*  MCV 91.2 90.3  PLT 248 221   Cardiac Enzymes: No results found for this basename: CKTOTAL, CKMB, CKMBINDEX, TROPONINI,  in the last 168 hours BNP: No components found with this basename: POCBNP,  CBG: No results found for this basename: GLUCAP,  in the last 168 hours  Radiological Exams on Admission: Ct Angio Chest Pe W/cm &/  or Wo Cm  01/22/2014   CLINICAL DATA:  Short of breath.  Confusion.  EXAM: CT ANGIOGRAPHY CHEST WITH CONTRAST  TECHNIQUE: Multidetector CT imaging of the chest was performed using the standard protocol during bolus administration of intravenous contrast. Multiplanar CT image reconstructions and MIPs were obtained to evaluate the vascular anatomy.  CONTRAST:  100mL OMNIPAQUE IOHEXOL 350 MG/ML SOLN  COMPARISON:  07/06/2012  FINDINGS: Pulmonary arterial opacification is good. There are no pulmonary emboli. There is atherosclerosis of the aorta but no aneurysm or dissection.  There is bilateral dependent lower lobe pneumonia suggesting aspiration. There is a small amount of pleural fluid on the left. The esophagus is markedly dilated and fluid-filled.  No mediastinal mass or adenopathy. The non involved portions of the lung are clear and normal.  There is a compression fracture at T12 which is old. No acute bony finding.  Review of the MIP images confirms the above findings.  IMPRESSION: No pulmonary emboli.  Bilateral dependent lower lobe bronchopneumonia, probably due to aspiration. Dilated, fluid-filled esophagus.   Electronically Signed   By: Paulina FusiMark  Shogry M.D.   On: 01/22/2014 23:34     Time spent: 40min  Saadat A Welton FlakesKhan [facility] Pager [phone]  If 7PM-7AM, please contact night-coverage [email or web address] Password [email or web address] 01/23/2014, 12:46 AM

## 2014-01-23 NOTE — Evaluation (Addendum)
Clinical/Bedside Swallow Evaluation Patient Details  Name: Loretta Garcia MRN: [id number] Date of Birth: [birth year]  Today's Date: 01/23/2014 Time: 0840-0908 SLP Time Calculation (min): 28 min  Past Medical History:  Past Medical History  Diagnosis Date  . Hypertension   . Osteoporosis   . GERD (gastroesophageal reflux disease)   . Potassium (K) deficiency   . UTI (lower urinary tract infection)     LAST UTI MARCH 2015 ?  Marland Kitchen. Stroke     TIA - 20 SOME YRS AGO - STILL HAS TINGLING LEFT LEG - NO OTHER RESIDUALS  . Anxiety   . Dementia     STILL ABLE TO SIGN OWN LEGAL PAPERS  . Arthritis     C/O OF PAIN IN MIDDLE BACK - PAST HX OF FRACTURE / OSTEOPOROSIS  . Recurrent falls     US[facility] RECENT PROB WITH HIP DISLOCATION- [facility]  [phone]   Past Surgical History:  Past Surgical History  Procedure Laterality Date  . Joint replacement      RIGHT HIP REPLACEMENT AND REVISIONS; LEFT TOTAL HIP REPLACEMENT AND REVISION.  Marland Kitchen. Eye surgery      SURGERY FOR DETACHED RETINA  . Total hip revision Right 01/20/2014    Procedure: RIGHT HIP ACETABULAR REVISION ;  Surgeon: Loanne DrillingFrank V Aluisio, MD;  Location: [facility];  Service: Orthopedics;  Laterality: Right;   HPI:  80 yo female with h/o GERD, dementia, UTI, CVA approx 20 years ago - [facility] joint prosthesis.  Pt is s/p right hip revision.  She developed post op dysphagia and SLP swallow evaluation was ordered.    Assessment / Plan / Recommendation Clinical Impression  Pt presents with no focal cranial nerve deficits impacting oropharyngeal swallow musculature.  Her swallow appears timely with adequate laryngeal elevation/closure palpated at bedside.    Observed pt to consume juice and 2 bites of yogurt.  Pt immediately complained of pain and sensation of stasis pointing to proximal esophagus.  Post swallow, she regurgitated  viscous frothy secretions.  Following a few bites of yogurt pt started vomiting- copious amount with relief of discomfort.  Suspect primary aspiration risk is due to GI/esophageal issues.    Per CT chest yesterday, pt with marked dilated fluid filled esophagus and bilateral pna-suspicous for aspiraton.  Informed pt of recommendations and concerns for suspected primary esoph/GI issues.    Pt denies issues with swallowing prior to admission but did mention issues with her "tongue not working well" to transfer food/drink into pharynx.  No dysarthria noted.      Informed pt of lack of oropharyngeal concerns with swallowing and recommendation for NPO with further work up- she was in agreement.  MD paged with results/recommendations and he returned call for further details.     Aspiration Risk  Moderate (due to suspected esophageal issues)    Diet Recommendation NPO   Medication Administration: Via alternative means             Pertinent Vitals/Pain Febrile - low grade, decreased     Swallow Study Prior Functional Status   pt denies dysphagia PTA    General Date of Onset: 01/23/14 HPI: 80 yo female with h/o GERD, dementia, UTI, CVA approx 20 years ago - [facility] of hip joint prosthesis.  Pt is s/p right hip revision.  She developed post op dysphagia and SLP swallow evaluation was  ordered.  Type of Study: Bedside swallow evaluation Diet Prior to this Study: Regular;Thin liquids Temperature Spikes Noted: Yes Respiratory Status: Nasal cannula Behavior/Cognition: Alert;Cooperative;Pleasant mood;Decreased sustained attention Oral Cavity - Dentition: Adequate natural dentition Self-Feeding Abilities: Able to feed self Patient Positioning: Upright in chair Baseline Vocal Quality: Clear Volitional Cough: Strong Volitional Swallow: Able to elicit    Oral/Motor/Sensory Function Overall Oral Motor/Sensory Function: Appears within functional limits for tasks assessed    Ice Chips Ice chips: Not tested   Thin Liquid Thin Liquid: Impaired Pharyngeal  Phase Impairments: Multiple swallows;Throat Clearing - Immediate Other Comments: pt complained of sensation of stasis pointing to upper esophagus - wincing with swallow complaining of pain    Nectar Thick Nectar Thick Liquid: Not tested   Honey Thick Honey Thick Liquid: Not tested   Puree Puree: Impaired Presentation: Self Fed;Spoon Pharyngeal Phase Impairments: Multiple swallows Other Comments: pt complained of sensation of stasis pointing to upper esophagus - wincing with swallow complaining of pain   Solid   GO    Solid: Not tested Other Comments: pt reported she could not swallow muffin as it was "too thick"       Donavan Burnetamara Adalberto Metzgar, MS [facility] SLP [phone]

## 2014-01-23 NOTE — Progress Notes (Signed)
OT Cancellation Note  Patient Details Name: Loretta Garcia MRN: [id number] DOB: 15-Mar-1920   Cancelled Treatment:    Reason Eval/Treat Not Completed: Other (comment).  Plan is for SNF for rehab; will defer OT to that venue.  OT eval is not needed for admission.    [name] 01/23/2014, 7:41 AM [name], OTR/L [phone] 01/23/2014

## 2014-01-24 ENCOUNTER — Encounter (HOSPITAL_COMMUNITY): Payer: Self-pay | Admitting: *Deleted

## 2014-01-24 ENCOUNTER — Encounter (HOSPITAL_COMMUNITY)
Admission: RE | Disposition: A | Discharge status: Skilled Nursing Facility | Payer: Medicare Other | Source: Ambulatory Visit | Attending: Orthopedic Surgery

## 2014-01-24 DIAGNOSIS — B3781 Candidal esophagitis: Secondary | ICD-10-CM | POA: Diagnosis not present

## 2014-01-24 DIAGNOSIS — R933 Abnormal findings on diagnostic imaging of other parts of digestive tract: Secondary | ICD-10-CM | POA: Diagnosis not present

## 2014-01-24 DIAGNOSIS — R1319 Other dysphagia: Secondary | ICD-10-CM | POA: Diagnosis not present

## 2014-01-24 DIAGNOSIS — K2210 Ulcer of esophagus without bleeding: Secondary | ICD-10-CM | POA: Diagnosis not present

## 2014-01-24 DIAGNOSIS — J690 Pneumonitis due to inhalation of food and vomit: Secondary | ICD-10-CM | POA: Diagnosis not present

## 2014-01-24 HISTORY — PX: ESOPHAGOGASTRODUODENOSCOPY: SHX5428

## 2014-01-24 SURGERY — EGD (ESOPHAGOGASTRODUODENOSCOPY)
Anesthesia: Moderate Sedation

## 2014-01-24 MED ORDER — MIDAZOLAM HCL 10 MG/2ML IJ SOLN
INTRAMUSCULAR | Status: DC | PRN
  Administered 2014-01-24 (×2): 1 mg via INTRAVENOUS

## 2014-01-24 MED ORDER — BUTAMBEN-TETRACAINE-BENZOCAINE 2-2-14 % EX AERO
INHALATION_SPRAY | CUTANEOUS | Status: DC | PRN
  Administered 2014-01-24: 2 via TOPICAL

## 2014-01-24 MED ORDER — MIDAZOLAM HCL 10 MG/2ML IJ SOLN
INTRAMUSCULAR | Status: AC
  Filled 2014-01-24: qty 2

## 2014-01-24 MED ORDER — FENTANYL CITRATE 0.05 MG/ML IJ SOLN
INTRAMUSCULAR | Status: AC
  Filled 2014-01-24: qty 2

## 2014-01-24 MED ORDER — ALPRAZOLAM 0.25 MG PO TABS
0.2500 mg | ORAL_TABLET | Freq: Three times a day (TID) | ORAL | Status: DC | PRN
  Administered 2014-01-26: 0.25 mg via ORAL
  Filled 2014-01-24: qty 1

## 2014-01-24 MED ORDER — FLUCONAZOLE 100 MG PO TABS
100.0000 mg | ORAL_TABLET | Freq: Every day | ORAL | Status: DC
  Administered 2014-01-24 – 2014-01-27 (×3): 100 mg via ORAL
  Filled 2014-01-24 (×5): qty 1

## 2014-01-24 MED ORDER — FENTANYL CITRATE 0.05 MG/ML IJ SOLN
INTRAMUSCULAR | Status: DC | PRN
  Administered 2014-01-24: 25 ug via INTRAVENOUS

## 2014-01-24 NOTE — Progress Notes (Signed)
[facility] PROGRESS NOTE  Loretta GoJane W Garcia WUJ:[id number] DOB: 01-26-1920 DOA: 01/20/2014 PCP: Londell MohPHARR,WALTER DAVIDSON, MD  Assessment/Plan: Principal Problem:   Dislocation of hip joint prosthesis - Orthopedic surgery on board and managing. Patient is status post right hip acetabular revision - Continue physical therapy  Active Problems:   Postoperative anemia due to acute blood loss - Stable with no active bleeding and hemoglobin improved from 9.3-9.7    Unspecified essential hypertension - Relatively well controlled we'll continue to monitor blood pressures remain elevated will consider placing on antihypertensive regimen.    GERD (gastroesophageal reflux disease) - Patient on protonic, stable    Hypopotassemia - Potassium within normal limits    Aspiration pneumonia - Secondary to dysphasia. - Patient is status post dilation - Continue broad-spectrum antibiotic    Other dysphagia - After endoscopy currently GI presuming Candida esophagitis and possible pill esophagitis Diflucan was started and esophageal irritants like iron and potassium chloride were discontinued  Code Status: full Family Communication: No family at bedside Disposition Plan: Likely within the next 1-2 days with continued improvement   Consultants:  Ortho  GI  Procedures:  EGD right hip acetabular revision  Antibiotics:  Cefepime and Vancomycin  HPI/Subjective: Patient states she feels better and is swallowing better now.  Objective: Filed Vitals:   01/24/14 1400  BP: 145/70  Pulse: 73  Temp: 99 F (37.2 C)  Resp: 19    Intake/Output Summary (Last 24 hours) at 01/24/14 1631 Last data filed at 01/24/14 1300  Gross per 24 hour  Intake 2017.5 ml  Output      0 ml  Net 2017.5 ml   Filed Weights   01/20/14 1244  Weight: 47.287 kg (104 lb 4 oz)    Exam:   General:  Pt in NAD, alert and awake  Cardiovascular: RRR, no MRG  Respiratory: No increased work of breathing,  breath sounds bilaterally, no wheezes  Abdomen: Soft, nondistended, positive bowel sounds  Musculoskeletal: No cyanosis or clubbing   Data Reviewed: Basic Metabolic Panel:  Recent Labs Lab 01/20/14 1315 01/21/14 0430 01/22/14 0425 01/23/14 0205  NA 138 136* 134* 138  K 3.0* 3.3* 3.8 3.8  CL 96 97 98 100  CO2 30 30 28 27   GLUCOSE 82 143* 143* 150*  BUN 17 14 16 14   CREATININE 0.57 0.57 0.58 0.55  CALCIUM 9.9 8.6 8.8 8.5   Liver Function Tests:  Recent Labs Lab 01/20/14 1315  AST 20  ALT 13  ALKPHOS 62  BILITOT 0.3  PROT 6.1  ALBUMIN 2.9*   No results found for this basename: LIPASE, AMYLASE,  in the last 168 hours No results found for this basename: AMMONIA,  in the last 168 hours CBC:  Recent Labs Lab 01/21/14 0430 01/22/14 0425 01/23/14 0205  WBC 8.5 10.9* 10.1  HGB 10.8* 9.3* 9.7*  HCT 31.0* 26.9* 28.4*  MCV 91.2 90.3 91.3  PLT 248 221 233   Cardiac Enzymes: No results found for this basename: CKTOTAL, CKMB, CKMBINDEX, TROPONINI,  in the last 168 hours BNP (last 3 results) No results found for this basename: PROBNP,  in the last 8760 hours CBG: No results found for this basename: GLUCAP,  in the last 168 hours  Recent Results (from the past 240 hour(s))  SURGICAL PCR SCREEN     Status: None   Collection Time    01/20/14 12:56 PM      Result Value Ref Range Status   MRSA, PCR NEGATIVE  NEGATIVE Final  Staphylococcus aureus NEGATIVE  NEGATIVE Final   Comment:            The Xpert SA Assay (FDA     approved for NASAL specimens     in patients over 87 years of age),     is one component of     a comprehensive surveillance     program.  Test performance has     been validated by [employer] for patients greater     than or equal to 10 year old.     It is not intended     to diagnose infection nor to     guide or monitor treatment.  CULTURE, BLOOD (ROUTINE X 2)     Status: None   Collection Time    01/23/14  2:00 AM      Result Value  Ref Range Status   Specimen Description BLOOD LEFT ANTECUBITAL   Final   Special Requests BOTTLES DRAWN AEROBIC ONLY 4CC   Final   Culture  Setup Time     Final   Value: 01/23/2014 08:56     Performed at [facility]   Culture     Final   Value:        BLOOD CULTURE RECEIVED NO GROWTH TO DATE CULTURE WILL BE HELD FOR 5 DAYS BEFORE ISSUING A FINAL NEGATIVE REPORT     Performed at [facility]   Report Status PENDING   Incomplete  CULTURE, BLOOD (ROUTINE X 2)     Status: None   Collection Time    01/23/14  2:05 AM      Result Value Ref Range Status   Specimen Description BLOOD LEFTHAND   Final   Special Requests BOTTLES DRAWN AEROBIC ONLY 3CC   Final   Culture  Setup Time     Final   Value: 01/23/2014 08:56     Performed at [facility]   Culture     Final   Value:        BLOOD CULTURE RECEIVED NO GROWTH TO DATE CULTURE WILL BE HELD FOR 5 DAYS BEFORE ISSUING A FINAL NEGATIVE REPORT     Performed at [facility]   Report Status PENDING   Incomplete     Studies: Ct Angio Chest Pe W/cm &/or Wo Cm  01/22/2014   CLINICAL DATA:  Short of breath.  Confusion.  EXAM: CT ANGIOGRAPHY CHEST WITH CONTRAST  TECHNIQUE: Multidetector CT imaging of the chest was performed using the standard protocol during bolus administration of intravenous contrast. Multiplanar CT image reconstructions and MIPs were obtained to evaluate the vascular anatomy.  CONTRAST:  OMNIPAQUE IOHEXOL 350 MG/ML SOLN  COMPARISON:  07/06/2012  FINDINGS: Pulmonary arterial opacification is good. There are no pulmonary emboli. There is atherosclerosis of the aorta but no aneurysm or dissection.  There is bilateral dependent lower lobe pneumonia suggesting aspiration. There is a small amount of pleural fluid on the left. The esophagus is markedly dilated and fluid-filled.  No mediastinal mass or adenopathy. The non involved portions of the lung are clear and normal.  There is a compression fracture at  T12 which is old. No acute bony finding.  Review of the MIP images confirms the above findings.  IMPRESSION: No pulmonary emboli.  Bilateral dependent lower lobe bronchopneumonia, probably due to aspiration. Dilated, fluid-filled esophagus.   Electronically Signed   By: Paulina Fusi M.D.   On: 01/22/2014 23:34    Scheduled  Meds: . aspirin EC  325 mg Oral Q breakfast  . ceFEPime (MAXIPIME) IV  2 g Intravenous QHS  . docusate sodium  100 mg Oral BID  . fluconazole  100 mg Oral Daily  . indapamide  1.25 mg Oral q morning - 10a  . ofloxacin  1 drop Right Eye BID  . pantoprazole (PROTONIX) IV  40 mg Intravenous Q24H  . polyethylene glycol  17 g Oral QODAY  . sertraline  50 mg Oral q morning - 10a  . vancomycin  750 mg Intravenous QHS   Continuous Infusions: . dextrose 5 % and 0.9 % NaCl with KCl 20 mEq/L 75 mL/hr at 01/24/14 0151    Time spent: > 35 minutes   Penny Piarlando Ernesta Trabert  [facility] Pager [phone]. If 7PM-7AM, please contact night-coverage at [email or web address], password [email or web address] 01/24/2014, 4:31 PM  LOS: 4 days

## 2014-01-24 NOTE — H&P (View-Only) (Signed)
Consultation  Referring Provider:  [facility] Primary Care Physician:  Londell MohPHARR,WALTER DAVIDSON, MD Primary Gastroenterologist:  none     Reason for Consultation:  dysphagia         HPI:   Loretta Garcia is a 80 y.o. female with right hip discolation, s/p right hip acetabular revision 01/20/14. Patient was nearing discharge when she became hypoxic / febrile. . CTscan done and negative for PE but revealed bilateral PNA.  possibly aspiration. The esophagus was markedly dilated and fluid-filled SLP evaluated this am.    Difficult to obtain history from patient as she is very sleepy this am and having periods of confusion.. Patient thinks swallowing problems started a few days prior to this admission. Feels like food nor fluids go down. She endorse pain with swallowing. I spoke with daughter Loretta Garcia and patient has had intermittent dysphagia for years. It sounds like she has had 1 or 2 esophageal dilations in the past but there were not any recent significant problems with her swallowing. Patient was living alone, weight stable.     Past Medical History  Diagnosis Date  . Hypertension   . Osteoporosis   . GERD (gastroesophageal reflux disease)   . Potassium (K) deficiency   . UTI (lower urinary tract infection)     LAST UTI MARCH 2015 ?  Marland Kitchen. Stroke     TIA - 20 SOME YRS AGO - STILL HAS TINGLING LEFT LEG - NO OTHER RESIDUALS  . Anxiety   . Dementia     STILL ABLE TO SIGN OWN LEGAL PAPERS  . Arthritis     C/O OF PAIN IN MIDDLE BACK - PAST HX OF FRACTURE / OSTEOPOROSIS  . Recurrent falls     USES [facility]ALKER - [facility] UNTIL RECENT PROB WITH HIP DISLOCATION- [facility]  [phone]    Past Surgical History  Procedure Laterality Date  . Joint replacement      RIGHT HIP REPLACEMENT AND REVISIONS; LEFT TOTAL HIP REPLACEMENT AND REVISION.  Marland Kitchen. Eye surgery      SURGERY FOR DETACHED RETINA  . Total hip revision Right  01/20/2014    Procedure: RIGHT HIP ACETABULAR REVISION ;  Surgeon: Loanne DrillingFrank V Aluisio, MD;  Location: [facility];  Service: Orthopedics;  Laterality: Right;     [facility]: daughter had colon caner  History  Substance Use Topics  . Smoking status: Never Smoker   . Smokeless tobacco: Never Used  . Alcohol Use: No    Prior to Admission medications   Medication Sig Start Date End Date Taking? Authorizing Provider  acetaminophen (TYLENOL) 500 MG tablet Take 1,000 mg by mouth 3 (three) times daily.   Yes Historical Provider, MD  APPLE CIDER VINEGAR PO Take 5 mLs by mouth every evening.    Yes Historical Provider, MD  aspirin EC 81 MG tablet Take 81 mg by mouth daily.   Yes Historical Provider, MD  BIOTIN 5000 PO Take 5,000 mcg by mouth daily.   Yes Historical Provider, MD  Calcium Citrate-Vitamin D (CALCIUM CITRATE + D) 315-250 MG-UNIT TABS Take 2 tablets by mouth 2 (two) times daily.   Yes Historical Provider, MD  CRANBERRY-VITAMIN C PO Take 1 tablet by mouth daily.   Yes Historical Provider, MD  Cyanocobalamin 2500 MCG CHEW Chew 2,500 mcg by mouth daily.   Yes Historical Provider, MD  indapamide (LOZOL) 1.25 MG tablet Take 1.25 mg by mouth every morning.   Yes  Historical Provider, MD  Melatonin 5 MG TABS Take 5 mg by mouth at bedtime as needed (Sleep).   Yes Historical Provider, MD  Multiple Vitamin (MULTIVITAMIN WITH MINERALS) TABS tablet Take 1 tablet by mouth daily.   Yes Historical Provider, MD  ofloxacin (OCUFLOX) 0.3 % ophthalmic solution Place 1 drop into the right eye 2 (two) times daily.   Yes Historical Provider, MD  polyethylene glycol (MIRALAX / GLYCOLAX) packet Take 17 g by mouth every other day.   Yes Historical Provider, MD  Potassium Chloride CR (MICRO-K) 8 MEQ CPCR capsule CR Take 8 mEq by mouth daily.   Yes Historical Provider, MD  sertraline (ZOLOFT) 50 MG tablet Take 50 mg by mouth every morning.   Yes Historical Provider, MD  vitamin C (ASCORBIC ACID) 500 MG tablet Take 500 mg by  mouth daily.   Yes Historical Provider, MD    Current Facility-Administered Medications  Medication Dose Route Frequency Provider Last Rate Last Dose  . acetaminophen (TYLENOL) tablet 650 mg  650 mg Oral Q6H PRN Loanne Drilling, MD   325 mg at 01/22/14 0028   Or  . acetaminophen (TYLENOL) suppository 650 mg  650 mg Rectal Q6H PRN Loanne Drilling, MD   650 mg at 01/22/14 2327  . aspirin EC tablet 325 mg  325 mg Oral Q breakfast Loanne Drilling, MD   325 mg at 01/22/14 0831  . bisacodyl (DULCOLAX) suppository 10 mg  10 mg Rectal Daily PRN Loanne Drilling, MD      . ceFEPIme (MAXIPIME) 2 g in dextrose 5 % 50 mL IVPB  2 g Intravenous QHS Loanne Drilling, MD   2 g at 01/23/14 0218  . dextrose 5 % and 0.9 % NaCl with KCl 20 mEq/L infusion   Intravenous Continuous Loanne Drilling, MD 75 mL/hr at 01/22/14 2327    . diphenhydrAMINE (BENADRYL) 12.5 MG/5ML elixir 12.5-25 mg  12.5-25 mg Oral Q4H PRN Loanne Drilling, MD      . docusate sodium (COLACE) capsule 100 mg  100 mg Oral BID Loanne Drilling, MD   100 mg at 01/22/14 2331  . gi cocktail (Maalox,Lidocaine,Donnatal)  30 mL Oral TID PRN Loanne Drilling, MD   30 mL at 01/22/14 1752  . HYDROmorphone (DILAUDID) injection 0.5 mg  0.5 mg Intravenous Q2H PRN Loanne Drilling, MD      . HYDROmorphone (DILAUDID) tablet 2 mg  2 mg Oral Q4H PRN Loanne Drilling, MD   2 mg at 01/21/14 0911  . indapamide (LOZOL) tablet 1.25 mg  1.25 mg Oral q morning - 10a Loanne Drilling, MD   1.25 mg at 01/22/14 1017  . iron polysaccharides (NIFEREX) capsule 150 mg  150 mg Oral Daily Alexzandrew Perkins, PA-C   150 mg at 01/22/14 1017  . menthol-cetylpyridinium (CEPACOL) lozenge 3 mg  1 lozenge Oral PRN Loanne Drilling, MD       Or  . phenol (CHLORASEPTIC) mouth spray 1 spray  1 spray Mouth/Throat PRN Loanne Drilling, MD   1 spray at 01/22/14 1818  . methocarbamol (ROBAXIN) tablet 500 mg  500 mg Oral Q6H PRN Loanne Drilling, MD   500 mg at 01/21/14 1610   Or  . methocarbamol  (ROBAXIN) 500 mg in dextrose 5 % 50 mL IVPB  500 mg Intravenous Q6H PRN Loanne Drilling, MD   500 mg at 01/20/14 2101  . metoCLOPramide (REGLAN) tablet 5-10 mg  5-10 mg Oral  Q8H PRN Loanne Drilling, MD       Or  . metoCLOPramide (REGLAN) injection 5-10 mg  5-10 mg Intravenous Q8H PRN Loanne Drilling, MD      . ofloxacin (OCUFLOX) 0.3 % ophthalmic solution 1 drop  1 drop Right Eye BID Loanne Drilling, MD   1 drop at 01/22/14 2332  . ondansetron (ZOFRAN) tablet 4 mg  4 mg Oral Q6H PRN Loanne Drilling, MD   4 mg at 01/22/14 1752   Or  . ondansetron [facility]) injection 4 mg  4 mg Intravenous Q6H PRN Loanne Drilling, MD   4 mg at 01/23/14 0856  . pantoprazole (PROTONIX) EC tablet 40 mg  40 mg Oral Daily Loanne Drilling, MD   40 mg at 01/22/14 1723  . polyethylene glycol (MIRALAX / GLYCOLAX) packet 17 g  17 g Oral QODAY Loanne Drilling, MD      . polyethylene glycol (MIRALAX / GLYCOLAX) packet 17 g  17 g Oral Daily PRN Loanne Drilling, MD   17 g at 01/21/14 0907  . potassium chloride SA (K-DUR,KLOR-CON) CR tablet 40 mEq  40 mEq Oral Daily Alexzandrew Perkins, PA-C   40 mEq at 01/22/14 1018  . sertraline (ZOLOFT) tablet 50 mg  50 mg Oral q morning - 10a Loanne Drilling, MD   50 mg at 01/22/14 1018  . vancomycin (VANCOCIN) IVPB 750 mg/150 ml premix  750 mg Intravenous QHS Loanne Drilling, MD   750 mg at 01/23/14 0310    Allergies as of 01/17/2014 - Review Complete 01/17/2014  Allergen Reaction Noted  . Altace [ramipril]  01/17/2014  . Calcitonin  01/17/2014  . Cardura [doxazosin mesylate]  01/17/2014  . Fosamax [alendronate sodium]  01/17/2014  . Penicillins  01/17/2014  . Tramadol  01/17/2014  . Codeine Other (See Comments) 07/06/2012  . Darvocet [propoxyphene n-acetaminophen] Other (See Comments) 07/06/2012  . Plavix [clopidogrel bisulfate] Other (See Comments) 07/06/2012    Review of Systems:    All systems reviewed and negative except where noted in HPI.   Physical Exam:  Vital signs in  last 24 hours: Temp:  [97.2 F (36.2 C)-100.6 F (38.1 C)] 97.2 F (36.2 C) (04/16 0506) Pulse Rate:  [79-114] 82 (04/16 0506) Resp:  [16-18] 16 (04/16 0506) BP: (115-171)/(68-84) 115/68 mmHg (04/16 0506) SpO2:  [82 %-96 %] 93 % (04/16 0506) Last BM Date:  (unknown) General:   Pleasant white female in NAD Head:  Normocephalic and atraumatic. Eyes:   No icterus.   Conjunctiva pink. Ears:  Normal auditory acuity. Neck:  Supple; no masses felt Lungs:  Respirations even and unlabored. Decreased breath sounds bilateral bases.   Heart:  Regular rate and rhythm Abdomen:  Soft, nondistended, nontender. Normal bowel sounds. No appreciable masses .  Rectal:  Not performed.  Msk:  Symmetrical without gross deformities.  Extremities:  Without edema. Neurologic:  Alert ;  grossly normal neurologically. She is very sleepy, keeps drifting off.  Skin:  Intact without significant lesions or rashes. Cervical Nodes:  No significant cervical adenopathy. Psych:  Alert and cooperative. Normal affect.  LAB RESULTS:  Recent Labs  01/21/14 0430 01/22/14 0425 01/23/14 0205  WBC 8.5 10.9* 10.1  HGB 10.8* 9.3* 9.7*  HCT 31.0* 26.9* 28.4*  PLT 248 221 233   BMET  Recent Labs  01/21/14 0430 01/22/14 0425 01/23/14 0205  NA 136* 134* 138  K 3.3* 3.8 3.8  CL 97 98 100  CO2 30 28  27  GLUCOSE 143* 143* 150*  BUN 14 16 14   CREATININE 0.57 0.58 0.55  CALCIUM 8.6 8.8 8.5   LFT  Recent Labs  01/20/14 1315  PROT 6.1  ALBUMIN 2.9*  AST 20  ALT 13  ALKPHOS 62  BILITOT 0.3   PT/INR  Recent Labs  01/20/14 1315  LABPROT 12.9  INR 0.99    STUDIES: Ct Angio Chest Pe W/cm &/or Wo Cm  01/22/2014   CLINICAL DATA:  Short of breath.  Confusion.  EXAM: CT ANGIOGRAPHY CHEST WITH CONTRAST  TECHNIQUE: Multidetector CT imaging of the chest was performed using the standard protocol during bolus administration of intravenous contrast. Multiplanar CT image reconstructions and MIPs were obtained to  evaluate the vascular anatomy.  CONTRAST:  100mL OMNIPAQUE IOHEXOL 350 MG/ML SOLN  COMPARISON:  07/06/2012  FINDINGS: Pulmonary arterial opacification is good. There are no pulmonary emboli. There is atherosclerosis of the aorta but no aneurysm or dissection.  There is bilateral dependent lower lobe pneumonia suggesting aspiration. There is a small amount of pleural fluid on the left. The esophagus is markedly dilated and fluid-filled.  No mediastinal mass or adenopathy. The non involved portions of the lung are clear and normal.  There is a compression fracture at T12 which is old. No acute bony finding.  Review of the MIP images confirms the above findings.  IMPRESSION: No pulmonary emboli.  Bilateral dependent lower lobe bronchopneumonia, probably due to aspiration. Dilated, fluid-filled esophagus.   Electronically Signed   By: Paulina FusiMark  Shogry M.D.   On: 01/22/2014 23:34   PREVIOUS ENDOSCOPIES:            possibly 2  esophageal dilations in the past (last one 5 + years ago) per daughter.   EGD in 2004 in Epic by Dr. Virginia Rochesterrr was entirely normal with a dilation performed.   Impression / Plan:   691. 80 year old female with right hip discolation, s/p right acetabular revision 01/20/14.  2. Acute on chronic (intermittent) dysphagia.  She is coughing and regurgitating with meals. Now with probable aspiration PNA.  CTscan does show fluid filled, dilated esophagus. SLP evaluated and feels dysphagia primarily esophageal.  Patient was dilated empirically (maybe twice) several years ago by Dr. Virginia Rochesterrr. She has not complained of recurrent dysphagia until this hospitalization and weight has been stable.   For further evaluation patient will be scheduled for EGD with possible dilation. We will need phone consent from daughter. Continue PPI.   3. [place] anemia, ? Post op?  She is on oral iron.   4. Low grade temp (100.6), probably related to aspiration PNA. On antibiotics.   Thanks   LOS: 3 days   Meredith Pelaula M Guenther   01/23/2014, 10:19 AM     Attending physician's note   I have taken a history, examined the patient and reviewed the chart. I agree with the Advanced Practitioner's note, impression and recommendations. Recurrent dysphagia in the past and fluid filled esophagus on CT imaging with possible aspiration pneumonia. Suspect she has a chronic esophageal motility disorder and/or GERD. EGD tomorrow to R/O a stricture, esophagitis, etc.   Meryl DareMalcolm T Stark, MD [facility]

## 2014-01-24 NOTE — Progress Notes (Signed)
Physical Therapy Treatment Patient Details Name: Loretta GoJane W Garcia MRN: [id number] DOB: 02/12/1920 Today's Date: 01/24/2014    History of Present Illness 80 yo female s/p R hip acetabular revision with constrained liner 4/13. Hx of HTN, osteoporosis, dementia, anxiety, TIA, L THA, multiple dislocations/surgeries R hip. [facility] is [facility]    PT Comments    Progressing well with mobility. Cognition has improved (compared to last two sessions).   Follow Up Recommendations  SNF;Supervision/Assistance - 24 hour     Equipment Recommendations  None recommended by PT    Recommendations for Other Services OT consult     Precautions / Restrictions Precautions Precautions: Posterior Hip;Fall Restrictions Weight Bearing Restrictions: No RLE Weight Bearing: Weight bearing as tolerated    Mobility  Bed Mobility               General bed mobility comments: pt sitting on BSC  Transfers Overall transfer level: Needs assistance Equipment used: Rolling walker (2 wheeled) Transfers: Sit to/from Stand Sit to Stand: Min assist Stand pivot transfers: Min assist       General transfer comment: Assist to rise, stabilize, control descent, manevuer with RW. VCs safety, technique, hand placement  Ambulation/Gait   Ambulation Distance (Feet): 150 Feet Assistive device: Rolling walker (2 wheeled) Gait Pattern/deviations: Step-through pattern;Decreased stride length;Wide base of support;Trunk flexed;Antalgic     General Gait Details: Min cueing for safety, posture, distance from walker. Continue to report pain with ambulation but able to increase distance this session. Remained on O2.    Stairs            Wheelchair Mobility    Modified Rankin (Stroke Patients Only)       Balance                                    Cognition Arousal/Alertness: Awake/Garcia Behavior During Therapy: WFL for tasks assessed/performed   Area of Impairment:  Memory;Orientation Orientation Level: Disoriented to;Time;Situation   Memory: Decreased short-term memory;Decreased recall of precautions              Exercises      General Comments        Pertinent Vitals/Pain Moderate pain-unable to rate numerically R hip    Home Living                      Prior Function            PT Goals (current goals can now be found in the care plan section) Progress towards PT goals: Progressing toward goals    Frequency  7X/week    PT Plan Current plan remains appropriate    Co-evaluation             End of Session Equipment Utilized During Treatment: Gait belt Activity Tolerance: Patient limited by fatigue;Patient limited by pain Patient left: in chair;with call bell/phone within reach;with nursing/sitter in room     Time: 1420-1434 PT Time Calculation (min): 14 min  Charges:  $Gait Training: 8-22 mins                    G Codes:      Loretta AlertJannie Ilaisaane Garcia, MPT Pager: [phone]

## 2014-01-24 NOTE — Op Note (Addendum)
[facility] [address] [place], [id number]   ENDOSCOPY PROCEDURE REPORT PATIENT: Loretta Garcia, Loretta W.  MR#: [id number] BIRTHDATE: September 29, 1920 , 89  yrs. old GENDER: Female ENDOSCOPIST: Meryl DareMalcolm T Liyah Higham, MD, [facility] REFERRED BY:  [facility] PROCEDURE DATE:  01/24/2014 PROCEDURE:  EGD w/ biopsy and Savary dilation of esophagus ASA CLASS:     Class III INDICATIONS:  Dysphagia, abnl CT of esophagus. MEDICATIONS: medications were titrated to patient response per physician's verbal order, Fentanyl 25 mcg IV, and Versed 2 mg IV TOPICAL ANESTHETIC: none DESCRIPTION OF PROCEDURE: After the risks benefits and alternatives of the procedure were thoroughly explained, informed consent was obtained.  The Pentax Gastroscope [id number] endoscope was introduced through the mouth and advanced to the second portion of the duodenum. Without limitations.  The instrument was slowly withdrawn as the mucosa was fully examined.  ESOPHAGUS: Scattered white exudate throughout the esophagus.Marked exudates and friable mucosa in the mid esophagus-biopsied. The esophagus was otherwise normal. STOMACH: The mucosa of the stomach appeared normal. DUODENUM: The duodenal mucosa showed no abnormalities in the bulb and second portion of the duodenum.  Retroflexed views revealed no abnormalities.   A guidewire was placed and the scope was then withdrawn from the patient. 15 mm Savary dilator passed with no resistance and some heme related to the biopsies. The dilator and guidewire were removed from the patient and the procedure completed.  COMPLICATIONS: There were no complications. ENDOSCOPIC IMPRESSION: 1.   Scattered white exudate throughout the esophagus.  Marked exudates and friable mucosa in the mid esophagus-biopsied. 2.   The EGD was otherwise normal.  RECOMMENDATIONS: 1.  Anti-reflux regimen 2.  Continue PPI 3.  Await pathology results 4.  Post dilation instructions 5.  Presumed  candida esophagitis and possible pill esophagitis. Begin Diflucan and DC esophageal irritants-Fe and KCl 6.  If dysphagia persists after discharge consider outpatient esophageal manometry  eSigned:  Meryl DareMalcolm T Mckenzy Salazar, MD, [facility] 01/24/2014 9:17 AM Revised: 01/24/2014 9:17 AM

## 2014-01-24 NOTE — Interval H&P Note (Signed)
History and Physical Interval Note:  01/24/2014 8:45 AM  Loretta Garcia  has presented today for surgery, with the diagnosis of dysphagia, abnormal esophagus on CTscan  The various methods of treatment have been discussed with the patient and family. After consideration of risks, benefits and other options for treatment, the patient has consented to  Procedure(s): ESOPHAGOGASTRODUODENOSCOPY (EGD) (N/A) as a surgical intervention .  The patient's history has been reviewed, patient examined, no change in status, stable for surgery.  I have reviewed the patient's chart and labs.  Questions were answered to the patient's satisfaction.     Meryl DareMalcolm T Brandyn Thien MD

## 2014-01-24 NOTE — Progress Notes (Signed)
CSW assisting with d/c. Spoke with pt's daughter this am to review d/c plan. Daughter Clide Deutscher( Janet Knight [phone] ) is in agreement with plans for SNF at d/c. CSW will continue to follow to [facility]  Cori RazorJamie Kordelia Severin LCSW [phone]

## 2014-01-24 NOTE — Progress Notes (Signed)
[id number]/Wendall Isabell Earlene Plateravis, RN, BSN, CCM, [phone] Chart reviewed for update of needs and condition./

## 2014-01-25 DIAGNOSIS — Z96649 Presence of unspecified artificial hip joint: Secondary | ICD-10-CM | POA: Diagnosis not present

## 2014-01-25 DIAGNOSIS — J690 Pneumonitis due to inhalation of food and vomit: Secondary | ICD-10-CM | POA: Diagnosis not present

## 2014-01-25 DIAGNOSIS — T84029A Dislocation of unspecified internal joint prosthesis, initial encounter: Secondary | ICD-10-CM | POA: Diagnosis not present

## 2014-01-25 DIAGNOSIS — B3781 Candidal esophagitis: Secondary | ICD-10-CM | POA: Diagnosis not present

## 2014-01-25 LAB — COMPREHENSIVE METABOLIC PANEL
ALBUMIN: 2 g/dL — AB (ref 3.5–5.2)
ALK PHOS: 46 U/L (ref 39–117)
ALT: 17 U/L (ref 0–35)
AST: 23 U/L (ref 0–37)
BUN: 6 mg/dL (ref 6–23)
CO2: 27 mEq/L (ref 19–32)
CREATININE: 0.52 mg/dL (ref 0.50–1.10)
Calcium: 7.7 mg/dL — ABNORMAL LOW (ref 8.4–10.5)
Chloride: 102 mEq/L (ref 96–112)
GFR calc Af Amer: >90 mL/min (ref >90)
GFR calc non Af Amer: 81 mL/min — ABNORMAL LOW (ref >90)
Glucose, Bld: 128 mg/dL — ABNORMAL HIGH (ref 70–99)
Potassium: 2.8 mEq/L — CL (ref 3.7–5.3)
Sodium: 139 mEq/L (ref 137–147)
Total Bilirubin: 0.5 mg/dL (ref 0.3–1.2)
Total Protein: 5.2 g/dL — ABNORMAL LOW (ref 6.0–8.3)

## 2014-01-25 LAB — CBC
HEMATOCRIT: 25.4 % — AB (ref 36.0–46.0)
Hemoglobin: 8.7 g/dL — ABNORMAL LOW (ref 12.0–15.0)
MCH: 31.4 pg (ref 26.0–34.0)
MCHC: 34.3 g/dL (ref 30.0–36.0)
MCV: 91.7 fL (ref 78.0–100.0)
PLATELETS: 261 10*3/uL (ref 150–400)
RBC: 2.77 MIL/uL — ABNORMAL LOW (ref 3.87–5.11)
RDW: 14.2 % (ref 11.5–15.5)
WBC: 8.4 10*3/uL (ref 4.0–10.5)

## 2014-01-25 LAB — PHOSPHORUS: PHOSPHORUS: 1.4 mg/dL — AB (ref 2.3–4.6)

## 2014-01-25 LAB — MAGNESIUM: MAGNESIUM: 1.4 mg/dL — AB (ref 1.5–2.5)

## 2014-01-25 MED ORDER — POTASSIUM PHOSPHATE DIBASIC 3 MMOLE/ML IV SOLN
20.0000 mmol | Freq: Once | INTRAVENOUS | Status: AC
  Administered 2014-01-25: 20 mmol via INTRAVENOUS
  Filled 2014-01-25: qty 6.67

## 2014-01-25 MED ORDER — AMLODIPINE BESYLATE 2.5 MG PO TABS
2.5000 mg | ORAL_TABLET | Freq: Every day | ORAL | Status: DC
Start: 2014-01-25 — End: 2014-01-27
  Administered 2014-01-25 – 2014-01-27 (×3): 2.5 mg via ORAL
  Filled 2014-01-25 (×3): qty 1

## 2014-01-25 MED ORDER — MAGNESIUM SULFATE 40 MG/ML IJ SOLN
2.0000 g | Freq: Once | INTRAMUSCULAR | Status: AC
  Administered 2014-01-25: 2 g via INTRAVENOUS
  Filled 2014-01-25: qty 50

## 2014-01-25 MED ORDER — POTASSIUM CHLORIDE CRYS ER 20 MEQ PO TBCR
40.0000 meq | EXTENDED_RELEASE_TABLET | Freq: Once | ORAL | Status: AC
  Administered 2014-01-25: 40 meq via ORAL
  Filled 2014-01-25: qty 2

## 2014-01-25 NOTE — Progress Notes (Addendum)
   Subjective: 1 Day Post-Op Procedure(s) (LRB): ESOPHAGOGASTRODUODENOSCOPY (EGD) (N/A) Patient reports pain as mild.   More alert this AM Plan is to go Skilled nursing facility after hospital stay.  Objective: Vital signs in last 24 hours: Temp:  [98.5 F (36.9 C)-99 F (37.2 C)] 98.5 F (36.9 C) (04/18 0535) Pulse Rate:  [67-96] 67 (04/18 0535) Resp:  [16-81] 18 (04/18 0535) BP: (124-157)/(46-114) 147/65 mmHg (04/18 0535) SpO2:  [77 %-98 %] 97 % (04/18 0535)  Intake/Output from previous day:  Intake/Output Summary (Last 24 hours) at 01/25/14 0825 Last data filed at 01/25/14 0600  Gross per 24 hour  Intake   1485 ml  Output      0 ml  Net   1485 ml    Intake/Output this shift:    Labs:  Recent Labs  01/23/14 0205  HGB 9.7*    Recent Labs  01/23/14 0205  WBC 10.1  RBC 3.11*  HCT 28.4*  PLT 233    Recent Labs  01/23/14 0205  NA 138  K 3.8  CL 100  CO2 27  BUN 14  CREATININE 0.55  GLUCOSE 150*  CALCIUM 8.5   No results found for this basename: LABPT, INR,  in the last 72 hours  EXAM General - Patient is Alert and Appropriate Extremity - Neurovascular intact Incision: dressing C/D/I No cellulitis present Dressing/Incision - clean, dry, no drainage Motor Function - intact, moving foot and toes well on exam.   Past Medical History  Diagnosis Date  . Hypertension   . Osteoporosis   . GERD (gastroesophageal reflux disease)   . Potassium (K) deficiency   . UTI (lower urinary tract infection)     LAST UTI MARCH 2015 ?  Marland Kitchen. Stroke     TIA - 20 SOME YRS AGO - STILL HAS TINGLING LEFT LEG - NO OTHER RESIDUALS  . Anxiety   . Dementia     STILL ABLE TO SIGN OWN LEGAL PAPERS  . Arthritis     C/O OF PAIN IN MIDDLE BACK - PAST HX OF FRACTURE / OSTEOPOROSIS  . Recurrent falls     [facility] WITH HIP DISLOCATION- [facility]  [phone]     Assessment/Plan: 1 Day Post-Op Procedure(s) (LRB): ESOPHAGOGASTRODUODENOSCOPY (EGD) (N/A) Principal Problem:   Dislocation of hip joint prosthesis Active Problems:   Postoperative anemia due to acute blood loss   Unspecified essential hypertension   GERD (gastroesophageal reflux disease)   Hypopotassemia   Aspiration pneumonia   Nonspecific (abnormal) findings on radiological and other examination of gastrointestinal tract   Other dysphagia   Up with therapy Continue IV antibiotics per medical team Discharge once they feel she is ready   Weight Bearing As Tolerated right Leg  Gus RankinFrank V Aluisio 01/25/2014, 8:25 AM  Addendum:  Called about low potassium.  Supplmented with 40 kdur PO.  EKG ordered and showed no significant change from recent EKGs. Patient is asymptomatic.  Spoke with Hospitalist Dr. Venetia Constableanga.  He agreed with plan to observe for any acute changes.

## 2014-01-25 NOTE — Progress Notes (Signed)
I have reviewed this note and agree with all findings. Kati Betsey Sossamon, PT, DPT Pager: [phone]   

## 2014-01-25 NOTE — Progress Notes (Signed)
Physical Therapy Treatment Patient Details Name: Loretta Garcia MRN: [id number] DOB: 1919-10-16 Today's Date: 01/25/2014    History of Present Illness 80 yo female s/p R hip acetabular revision with constrained liner 4/13. Hx of HTN, osteoporosis, dementia, anxiety, TIA, L THA, multiple dislocations/surgeries R hip. Pt is from [facility]    PT Comments    Pt ambulated in hallway, reporting feeling unsteady however decreased pain from prior days.  Pt is progressing well with mobility.  Posterior hip precautions reviewed multiple times as pt is unable to recall precautions, handout provided.  Follow Up Recommendations  SNF;Supervision/Assistance - 24 hour     Equipment Recommendations  None recommended by PT    Recommendations for Other Services       Precautions / Restrictions Precautions Precautions: Posterior Hip;Fall Precaution Comments: Reinforced posterior hip precautions multiple times, at beginning of tx pt able to recall 0/3, at end of treatment able to recall 2/3, provided with handout to reinforce Restrictions Weight Bearing Restrictions: No RLE Weight Bearing: Weight bearing as tolerated    Mobility  Bed Mobility Overal bed mobility: Needs Assistance Bed Mobility: Supine to Sit;Sit to Supine     Supine to sit: Min assist Sit to supine: Min assist   General bed mobility comments: verbal cues for safety and maintenance of precautions; min assist to manage R LE  Transfers Overall transfer level: Needs assistance Equipment used: Rolling walker (2 wheeled) Transfers: Sit to/from [employer]Stand;Stand Pivot Transfers Sit to Stand: Min guard Stand pivot transfers: Min guard       General transfer comment: verbal cues for safety and technique; adequate strength to ascend, needs guard due to reports of feeling unsteady; stand pivot to [facility] prior to ambulating  Ambulation/Gait Ambulation/Gait assistance: Min guard Ambulation Distance (Feet): 220 Feet Assistive device:  Rolling walker (2 wheeled) Gait Pattern/deviations: Step-through pattern;Decreased stride length;Trunk flexed;Antalgic Gait velocity: decr   General Gait Details: verbal cuing for safety; pt reports feeling unsteady and not as strong   Careers information officertairs            Wheelchair Mobility    Modified Rankin (Stroke Patients Only)       Balance                                    Cognition Arousal/Alertness: Awake/alert Behavior During Therapy: WFL for tasks assessed/performed   Area of Impairment: Memory     Memory: Decreased short-term memory;Decreased recall of precautions              Exercises      General Comments        Pertinent Vitals/Pain Pt premedicated, activity to tolerance.     Home Living                      Prior Function            PT Goals (current goals can now be found in the care plan section) Progress towards PT goals: Progressing toward goals    Frequency  Min 5X/week    PT Plan Frequency needs to be updated    Co-evaluation             End of Session Equipment Utilized During Treatment: Gait belt Activity Tolerance: Patient tolerated treatment well Patient left: in bed;with call bell/phone within reach;with nursing/sitter in room     Time: [id number] PT Time Calculation (min): 18 min  Charges:  $Gait Training: 8-22 mins                    G Codes:      Bufford LopeLaura Shubham Thackston 01/25/2014, 4:17 PM Bufford LopeLaura Taiven Greenley SPT 01/25/2014

## 2014-01-25 NOTE — Progress Notes (Signed)
[facility] PROGRESS NOTE  Arville GoJane W Strahan ZOX:[id number] DOB: 1920/09/17 DOA: 01/20/2014 PCP: Londell MohPHARR,WALTER DAVIDSON, MD  Brief hospital course narrative I have seen and examined Ms. Pokorney at bedside in the presence of her bed sitter and reviewed her chart. She is a pleasant 80 year old female who was admitted for a hip dislocation and underwent right hip acetabular revision on 4/13. She is being followed by the hospitalist service for aspiration pneumonitis and perioperative management of other medical problems including postoperative blood loss anemia. She was found to have bilateral pneumonia felt to be aspiration pneumonitis by CT chest done on 01/2015/15 to evaluate for possible PE as patient had developed confusion and hypoxia. She is now on vancomycin and cefepime. Patient also had EGD with findings suspicious for Candida esophagitis and/or pill esophagitis. Plan  Dislocation of hip joint prosthesis  - Orthopedic surgery managing. Patient is status post right hip acetabular revision  - Continue physical therapy  Active Problems:  Postoperative anemia due to acute blood loss  - Stable with no active bleeding and hemoglobin improved from 9.3-9.7  - Repeat CBC today Unspecified essential hypertension  - BP trending on the high side. - Start low-dose Norvasc  GERD (gastroesophageal reflux disease)  - Patient on protonic, stable  Hypopotassemia  - Potassium within normal limits  Aspiration pneumonia  - Likely secondary to dysphagia.  - Patient is status post dilation  - Continue broad-spectrum antibiotic(D3)  Other dysphagia  - After endoscopy currently GI presuming Candida esophagitis and possible pill esophagitis. Diflucan was started and esophageal irritants like iron and potassium chloride were discontinued. Code Status: full  Family Communication: No family at bedside  Disposition Plan: Likely DC to SNFwithin the next 1-2 days with continued improvement  Consultants:   Ortho  GI Procedures:  EGD  right hip acetabular revision  Antibiotics:  Cefepime and Vancomycin(01/23/14>  HPI/Subjective: Says she does not know how she feels. Denies dysphagia or shortness of breath or right hip pain.  Objective: Filed Vitals:   01/25/14 0535  BP: 147/65  Pulse: 67  Temp: 98.5 F (36.9 C)  Resp: 18    Intake/Output Summary (Last 24 hours) at 01/25/14 0918 Last data filed at 01/25/14 0600  Gross per 24 hour  Intake   1485 ml  Output      0 ml  Net   1485 ml   Filed Weights   01/20/14 1244  Weight: 47.287 kg (104 lb 4 oz)    Exam:   General:  Sitting up in bed eating. Appears comfortable.  Cardiovascular: RRR. S1S2 normal. No murmurs.  Respiratory: Good air entry bilaterally. No rhonchi.  Abdomen: Soft and nontender. Normal bowel sounds.  Musculoskeletal: Surgical site clean on the right hip no bleeding or discharge.  Data Reviewed: Basic Metabolic Panel:  Recent Labs Lab 01/20/14 1315 01/21/14 0430 01/22/14 0425 01/23/14 0205  NA 138 136* 134* 138  K 3.0* 3.3* 3.8 3.8  CL 96 97 98 100  CO2 30 30 28 27   GLUCOSE 82 143* 143* 150*  BUN 17 14 16 14   CREATININE 0.57 0.57 0.58 0.55  CALCIUM 9.9 8.6 8.8 8.5   Liver Function Tests:  Recent Labs Lab 01/20/14 1315  AST 20  ALT 13  ALKPHOS 62  BILITOT 0.3  PROT 6.1  ALBUMIN 2.9*   No results found for this basename: LIPASE, AMYLASE,  in the last 168 hours No results found for this basename: AMMONIA,  in the last 168 hours CBC:  Recent  Labs Lab 01/21/14 0430 01/22/14 0425 01/23/14 0205  WBC 8.5 10.9* 10.1  HGB 10.8* 9.3* 9.7*  HCT 31.0* 26.9* 28.4*  MCV 91.2 90.3 91.3  PLT 248 221 233   Cardiac Enzymes: No results found for this basename: CKTOTAL, CKMB, CKMBINDEX, TROPONINI,  in the last 168 hours BNP (last 3 results) No results found for this basename: PROBNP,  in the last 8760 hours CBG: No results found for this basename: GLUCAP,  in the last 168  hours  Recent Results (from the past 240 hour(s))  SURGICAL PCR SCREEN     Status: None   Collection Time    01/20/14 12:56 PM      Result Value Ref Range Status   MRSA, PCR NEGATIVE  NEGATIVE Final   Staphylococcus aureus NEGATIVE  NEGATIVE Final   Comment:            The Xpert SA Assay (FDA     approved for NASAL specimens     in patients over 38 years of age),     is one component of     a comprehensive surveillance     program.  Test performance has     been validated by [employer] for patients greater     than or equal to 79 year old.     It is not intended     to diagnose infection nor to     guide or monitor treatment.  CULTURE, BLOOD (ROUTINE X 2)     Status: None   Collection Time    01/23/14  2:00 AM      Result Value Ref Range Status   Specimen Description BLOOD LEFT ANTECUBITAL   Final   Special Requests BOTTLES DRAWN AEROBIC ONLY 4CC   Final   Culture  Setup Time     Final   Value: 01/23/2014 08:56     Performed at [facility]   Culture     Final   Value:        BLOOD CULTURE RECEIVED NO GROWTH TO DATE CULTURE WILL BE HELD FOR 5 DAYS BEFORE ISSUING A FINAL NEGATIVE REPORT     Performed at [facility]   Report Status PENDING   Incomplete  CULTURE, BLOOD (ROUTINE X 2)     Status: None   Collection Time    01/23/14  2:05 AM      Result Value Ref Range Status   Specimen Description BLOOD LEFTHAND   Final   Special Requests BOTTLES DRAWN AEROBIC ONLY 3CC   Final   Culture  Setup Time     Final   Value: 01/23/2014 08:56     Performed at [facility]   Culture     Final   Value:        BLOOD CULTURE RECEIVED NO GROWTH TO DATE CULTURE WILL BE HELD FOR 5 DAYS BEFORE ISSUING A FINAL NEGATIVE REPORT     Performed at [facility]   Report Status PENDING   Incomplete     Studies: No results found.  Scheduled Meds: . aspirin EC  325 mg Oral Q breakfast  . ceFEPime (MAXIPIME) IV  2 g Intravenous QHS  . docusate sodium  100  mg Oral BID  . fluconazole  100 mg Oral Daily  . indapamide  1.25 mg Oral q morning - 10a  . ofloxacin  1 drop Right Eye BID  . pantoprazole (PROTONIX) IV  40 mg Intravenous Q24H  .  polyethylene glycol  17 g Oral QODAY  . sertraline  50 mg Oral q morning - 10a  . vancomycin  750 mg Intravenous QHS   Continuous Infusions: . dextrose 5 % and 0.9 % NaCl with KCl 20 mEq/L 75 mL/hr at 01/24/14 0151      Leeandra Ellerson  [facility] Pager [phone]. If 7PM-7AM, please contact night-coverage at [email or web address], password [email or web address] 01/25/2014, 9:18 AM  LOS: 5 days

## 2014-01-25 NOTE — Plan of Care (Signed)
Problem: Phase III Progression Outcomes Goal: Anticoagulant follow-up in place Outcome: Not Applicable Date Met:  12/87/86 asa

## 2014-01-26 DIAGNOSIS — J690 Pneumonitis due to inhalation of food and vomit: Secondary | ICD-10-CM | POA: Diagnosis not present

## 2014-01-26 LAB — BASIC METABOLIC PANEL
BUN: 5 mg/dL — ABNORMAL LOW (ref 6–23)
CHLORIDE: 98 meq/L (ref 96–112)
CO2: 26 mEq/L (ref 19–32)
Calcium: 8 mg/dL — ABNORMAL LOW (ref 8.4–10.5)
Creatinine, Ser: 0.47 mg/dL — ABNORMAL LOW (ref 0.50–1.10)
GFR calc non Af Amer: 84 mL/min — ABNORMAL LOW (ref >90)
Glucose, Bld: 114 mg/dL — ABNORMAL HIGH (ref 70–99)
Potassium: 3.6 mEq/L — ABNORMAL LOW (ref 3.7–5.3)
Sodium: 135 mEq/L — ABNORMAL LOW (ref 137–147)

## 2014-01-26 MED ORDER — PANTOPRAZOLE SODIUM 40 MG PO TBEC
40.0000 mg | DELAYED_RELEASE_TABLET | Freq: Every day | ORAL | Status: DC
  Administered 2014-01-27: 40 mg via ORAL
  Filled 2014-01-26: qty 1

## 2014-01-26 MED ORDER — LEVOFLOXACIN 750 MG PO TABS
750.0000 mg | ORAL_TABLET | ORAL | Status: DC
  Administered 2014-01-26: 750 mg via ORAL
  Filled 2014-01-26: qty 1

## 2014-01-26 NOTE — Progress Notes (Signed)
ANTIBIOTIC CONSULT NOTE - FOLLOW UP  Pharmacy Consult for Vancomycin, Cefepime Indication: pneumonia  Allergies  Allergen Reactions  . Altace [Ramipril]     Per MAR  . Calcitonin     Per MAR  . Cardura [Doxazosin Mesylate]     Per MAR  . Fosamax [Alendronate Sodium]     Per MAR  . Penicillins     Per MAR  . Tramadol     Per MAR  . Codeine Other (See Comments)    unknown  . Darvocet [Propoxyphene N-Acetaminophen] Other (See Comments)    unknown  . Plavix [Clopidogrel Bisulfate] Other (See Comments)    unsure    Patient Measurements: Height: 5' (152.4 cm) Weight: 104 lb 4 oz (47.287 kg) IBW/kg (Calculated) : 45.5  Vital Signs: Temp: 98.3 F (36.8 C) (04/19 0517) Temp src: Oral (04/19 0517) BP: 157/70 mmHg (04/19 0517) Pulse Rate: 80 (04/19 0517) Intake/Output from previous day: 04/18 0701 - 04/19 0700 In: 3142.9 [P.O.:840; I.V.:1346.3; IV Piggyback:956.7] Out: -  Intake/Output from this shift: Total I/O In: 410 [P.O.:410] Out: -   Labs:  Recent Labs  01/25/14 1036 01/26/14 0438  WBC 8.4  --   HGB 8.7*  --   PLT 261  --   CREATININE 0.52 0.47*   Estimated Creatinine Clearance: 32.9 ml/min (by C-G formula based on Cr of 0.47). No results found for this basename: Rolm GalaVANCOTROUGH, VANCOPEAK, VANCORANDOM, GENTTROUGH, GENTPEAK, GENTRANDOM, TOBRATROUGH, TOBRAPEAK, TOBRARND, AMIKACINPEAK, AMIKACINTROU, AMIKACIN,  in the last 72 hours   Microbiology: 4/16 blood: NGTD  Assessment: 89 YOF s/p dislocated hip joint prosthesis s/p R hip acetabular revision with placement of constrained liner on 4/14. Developed SOB and confusion on 4/15, CTA negative for PE. Started on broad spectrum antibiotics 4/16am due to supsected aspiration pneumonia.   Day #4 Vancomycin 750 mg IV q24h and Cefepime 2g IV q24h for aspiration pneumonia.  Day #3 Fluconazole per MD per possible esophageal candidiasis per EGD.  Tmax: afebrile  WBCs: WNL  Renal: Scr 0.47 for est CrCl 33 (C-G) and  50 (N, using Scr = 0.8)  Blood cultures no growth to date.  Goal of Therapy:  Vancomycin trough level 15-20 mcg/ml  Plan:  1.  Continue Cefepime 2g IV q24h. 2.  Check vancomycin trough tonight and adjust dose as needed. 3.  F/u plan for narrowing of antibiotics.  Maryanna ShapeAmanda M Philisha Weinel 01/26/2014,1:26 PM

## 2014-01-26 NOTE — Progress Notes (Signed)
PHARMACY BRIEF NOTE:  PROTONIX IV TO PO CONVERSION  This patient is receiving IV Protonix, which is in critically short supply.    Based on emergency conservation measures implemented by the Pharmacy and Therapeutics Committee chairman in consultation with gastroenterology, this patient now meets criteria for automatic switch to PO Protonix; the medication profile has been updated accordingly.  If there are questions regarding this change, please contact Pharmacy at [phone]  Thank you,  Clance BollAmanda Senya Hinzman, PharmD, BCPS Pager: [phone] 01/26/2014 1:33 PM

## 2014-01-26 NOTE — Progress Notes (Signed)
Patient ID: Loretta Garcia W Garcia, female   DOB: 22-Oct-1919, 80 y.o.   MRN: [id number] Subjective: 2 Days Post-Op Procedure(s) (LRB): ESOPHAGOGASTRODUODENOSCOPY (EGD) (N/A)   POD#4 s/p revision right hip, acetabular constrained liner  Patient reports pain as mild.  Pleasantly confused, sitter in room.  No events reported  Objective:   VITALS:   Filed Vitals:   01/26/14 0517  BP: 157/70  Pulse: 80  Temp: 98.3 F (36.8 C)  Resp: 18    Neurovascular intact Incision: steri strips intact some missing, no drainage  LABS  Recent Labs  01/25/14 1036  HGB 8.7*  HCT 25.4*  WBC 8.4  PLT 261     Recent Labs  01/25/14 1036 01/26/14 0438  NA 139 135*  K 2.8* 3.6*  BUN 6 5*  CREATININE 0.52 0.47*  GLUCOSE 128* 114*    No results found for this basename: LABPT, INR,  in the last 72 hours   Assessment/Plan: 2 Days Post-Op Procedure(s) (LRB): ESOPHAGOGASTRODUODENOSCOPY (EGD) (N/A)   Discharge to SNF when stable medically To be re-assessed tomorrow by [employer]

## 2014-01-26 NOTE — Progress Notes (Signed)
ANTIBIOTIC CONSULT NOTE - FOLLOW UP  Pharmacy Consult for Levaquin Indication: pneumonia  Allergies  Allergen Reactions  . Altace [Ramipril]     Per MAR  . Calcitonin     Per MAR  . Cardura [Doxazosin Mesylate]     Per MAR  . Fosamax [Alendronate Sodium]     Per MAR  . Penicillins     Per MAR  . Tramadol     Per MAR  . Codeine Other (See Comments)    unknown  . Darvocet [Propoxyphene N-Acetaminophen] Other (See Comments)    unknown  . Plavix [Clopidogrel Bisulfate] Other (See Comments)    unsure    Patient Measurements: Height: 5' (152.4 cm) Weight: 104 lb 4 oz (47.287 kg) IBW/kg (Calculated) : 45.5  Vital Signs: Temp: 97.8 F (36.6 C) (04/19 1427) Temp src: Oral (04/19 1427) BP: 149/81 mmHg (04/19 1427) Pulse Rate: 80 (04/19 1427) Intake/Output from previous day: 04/18 0701 - 04/19 0700 In: 3142.9 [P.O.:840; I.V.:1346.3; IV Piggyback:956.7] Out: -  Intake/Output from this shift: Total I/O In: 410 [P.O.:410] Out: -   Labs:  Recent Labs  01/25/14 1036 01/26/14 0438  WBC 8.4  --   HGB 8.7*  --   PLT 261  --   CREATININE 0.52 0.47*   Estimated Creatinine Clearance: 32.9 ml/min (by C-G formula based on Cr of 0.47). No results found for this basename: Rolm GalaVANCOTROUGH, VANCOPEAK, VANCORANDOM, GENTTROUGH, GENTPEAK, GENTRANDOM, TOBRATROUGH, TOBRAPEAK, TOBRARND, AMIKACINPEAK, AMIKACINTROU, AMIKACIN,  in the last 72 hours   Microbiology: 4/16 blood: NGTD  Assessment: 89 YOF s/p dislocated hip joint prosthesis s/p R hip acetabular revision with placement of constrained liner on 4/14. Developed SOB and confusion on 4/15, CTA negative for PE. Started on broad spectrum antibiotics 4/16am due to supsected aspiration pneumonia.   Day #4 Vancomycin 750 mg IV q24h and Cefepime 2g IV q24h for aspiration pneumonia - narrowing to PO levaquin today.  Day #3 Fluconazole per MD per possible esophageal candidiasis per EGD.  Tmax: afebrile  WBCs: WNL  Renal: Scr 0.47 for  est CrCl 33 (C-G) and 50 (N, using Scr = 0.8)  Blood cultures no growth to date.  Goal of Therapy:  Eradication of infection Dose appropriate for renal function  Plan:   Levaquin 750mg  PO q48h for CrCl < 50 ml/min  F/U renal function and adjust as needed  Loralee PacasErin Chariti Havel, PharmD, BCPS Pager: [phone] 01/26/2014,2:44 PM

## 2014-01-26 NOTE — Progress Notes (Signed)
[facility] PROGRESS NOTE  Loretta Garcia [id number] DOB: [birth year] DOA: 01/20/2014 PCP: Londell MohPHARR,WALTER DAVIDSON, MD  Assessment/Plan: Principal Problem:   Dislocation of hip joint prosthesis - Orthopedic surgery on board and managing.  - Patient is status post right hip acetabular revision - Continue physical therapy  Active Problems:   Postoperative anemia due to acute blood loss - Stable with no active bleeding and hemoglobin improved from 9.3-9.7    Unspecified essential hypertension - Relatively well controlled we'll continue to monitor blood pressures remain elevated will consider placing on antihypertensive regimen.    GERD (gastroesophageal reflux disease) - Patient on protonic, stable    Hypopotassemia - Potassium within normal limits    Aspiration pneumonia - Secondary to dysphasia. - Patient is status post dilation - Continue broad-spectrum antibiotic - Will narrow antibiotic regimen and place on levaquin    Other dysphagia - After endoscopy currently GI presuming Candida esophagitis and possible pill esophagitis Diflucan was started and esophageal irritants like iron and potassium chloride were discontinued  Hypokalemia - most likely due to poor oral intake - improving. Will monitor for now. GI has recommended holding off on oral potassium replacement due to Esophagitis  Code Status: full Family Communication: No family at bedside Disposition Plan: Likely within the next 1-2 days with continued improvement, will narrow antibiotic regimen today.   Consultants:  Ortho  GI  Procedures:  EGD right hip acetabular revision  Antibiotics:  Cefepime and Vancomycin>>>4/19  Levaquin  HPI/Subjective: No new complaints, no acute issues reported overnight.  Objective: Filed Vitals:   01/26/14 0517  BP: 157/70  Pulse: 80  Temp: 98.3 F (36.8 C)  Resp: 18    Intake/Output Summary (Last 24 hours) at 01/26/14 1427 Last data filed at 01/26/14  1258  Gross per 24 hour  Intake 2167.92 ml  Output      0 ml  Net 2167.92 ml   Filed Weights   01/20/14 1244  Weight: 47.287 kg (104 lb 4 oz)    Exam:   General:  Pt in NAD, alert and awake  Cardiovascular: RRR, no MRG  Respiratory: No increased work of breathing, breath sounds bilaterally, no wheezes  Abdomen: Soft, nondistended, positive bowel sounds  Musculoskeletal: No cyanosis or clubbing   Data Reviewed: Basic Metabolic Panel:  Recent Labs Lab 01/21/14 0430 01/22/14 0425 01/23/14 0205 01/25/14 1036 01/26/14 0438  NA 136* 134* 138 139 135*  K 3.3* 3.8 3.8 2.8* 3.6*  CL 97 98 100 102 98  CO2 30 28 27 27 26   GLUCOSE 143* 143* 150* 128* 114*  BUN 14 16 14 6  5*  CREATININE 0.57 0.58 0.55 0.52 0.47*  CALCIUM 8.6 8.8 8.5 7.7* 8.0*  MG  --   --   --  1.4*  --   PHOS  --   --   --  1.4*  --    Liver Function Tests:  Recent Labs Lab 01/20/14 1315 01/25/14 1036  AST 20 23  ALT 13 17  ALKPHOS 62 46  BILITOT 0.3 0.5  PROT 6.1 5.2*  ALBUMIN 2.9* 2.0*   No results found for this basename: LIPASE, AMYLASE,  in the last 168 hours No results found for this basename: AMMONIA,  in the last 168 hours CBC:  Recent Labs Lab 01/21/14 0430 01/22/14 0425 01/23/14 0205 01/25/14 1036  WBC 8.5 10.9* 10.1 8.4  HGB 10.8* 9.3* 9.7* 8.7*  HCT 31.0* 26.9* 28.4* 25.4*  MCV 91.2 90.3 91.3 91.7  PLT 248 221 233  261   Cardiac Enzymes: No results found for this basename: CKTOTAL, CKMB, CKMBINDEX, TROPONINI,  in the last 168 hours BNP (last 3 results) No results found for this basename: PROBNP,  in the last 8760 hours CBG: No results found for this basename: GLUCAP,  in the last 168 hours  Recent Results (from the past 240 hour(s))  SURGICAL PCR SCREEN     Status: None   Collection Time    01/20/14 12:56 PM      Result Value Ref Range Status   MRSA, PCR NEGATIVE  NEGATIVE Final   Staphylococcus aureus NEGATIVE  NEGATIVE Final   Comment:            The Xpert SA  Assay (FDA     approved for NASAL specimens     in patients over 62 years of age),     is one component of     a comprehensive surveillance     program.  Test performance has     been validated by [employer] for patients greater     than or equal to 76 year old.     It is not intended     to diagnose infection nor to     guide or monitor treatment.  CULTURE, BLOOD (ROUTINE X 2)     Status: None   Collection Time    01/23/14  2:00 AM      Result Value Ref Range Status   Specimen Description BLOOD LEFT ANTECUBITAL   Final   Special Requests BOTTLES DRAWN AEROBIC ONLY 4CC   Final   Culture  Setup Time     Final   Value: 01/23/2014 08:56     Performed at [facility]   Culture     Final   Value:        BLOOD CULTURE RECEIVED NO GROWTH TO DATE CULTURE WILL BE HELD FOR 5 DAYS BEFORE ISSUING A FINAL NEGATIVE REPORT     Performed at [facility]   Report Status PENDING   Incomplete  CULTURE, BLOOD (ROUTINE X 2)     Status: None   Collection Time    01/23/14  2:05 AM      Result Value Ref Range Status   Specimen Description BLOOD LEFTHAND   Final   Special Requests BOTTLES DRAWN AEROBIC ONLY 3CC   Final   Culture  Setup Time     Final   Value: 01/23/2014 08:56     Performed at [facility]   Culture     Final   Value:        BLOOD CULTURE RECEIVED NO GROWTH TO DATE CULTURE WILL BE HELD FOR 5 DAYS BEFORE ISSUING A FINAL NEGATIVE REPORT     Performed at [facility]   Report Status PENDING   Incomplete     Studies: No results found.  Scheduled Meds: . amLODipine  2.5 mg Oral Daily  . aspirin EC  325 mg Oral Q breakfast  . ceFEPime (MAXIPIME) IV  2 g Intravenous QHS  . docusate sodium  100 mg Oral BID  . fluconazole  100 mg Oral Daily  . indapamide  1.25 mg Oral q morning - 10a  . ofloxacin  1 drop Right Eye BID  . [START ON 01/27/2014] pantoprazole  40 mg Oral Daily  . polyethylene glycol  17 g Oral QODAY  . sertraline  50 mg Oral q  morning - 10a  . vancomycin  750 mg Intravenous QHS   Continuous Infusions:    Time spent: > 35 minutes   Penny Piarlando Marcella Charlson  [facility] Pager [phone]. If 7PM-7AM, please contact night-coverage at [email or web address], password [email or web address] 01/26/2014, 2:27 PM  LOS: 6 days

## 2014-01-27 ENCOUNTER — Encounter: Payer: Self-pay | Admitting: Gastroenterology

## 2014-01-27 ENCOUNTER — Encounter (HOSPITAL_COMMUNITY): Payer: Self-pay | Admitting: Gastroenterology

## 2014-01-27 DIAGNOSIS — M62838 Other muscle spasm: Secondary | ICD-10-CM | POA: Diagnosis not present

## 2014-01-27 DIAGNOSIS — Z5189 Encounter for other specified aftercare: Secondary | ICD-10-CM | POA: Diagnosis not present

## 2014-01-27 DIAGNOSIS — M159 Polyosteoarthritis, unspecified: Secondary | ICD-10-CM | POA: Diagnosis not present

## 2014-01-27 DIAGNOSIS — R110 Nausea: Secondary | ICD-10-CM | POA: Diagnosis not present

## 2014-01-27 DIAGNOSIS — B379 Candidiasis, unspecified: Secondary | ICD-10-CM | POA: Diagnosis not present

## 2014-01-27 DIAGNOSIS — J189 Pneumonia, unspecified organism: Secondary | ICD-10-CM | POA: Diagnosis not present

## 2014-01-27 DIAGNOSIS — N390 Urinary tract infection, site not specified: Secondary | ICD-10-CM | POA: Diagnosis not present

## 2014-01-27 DIAGNOSIS — F0390 Unspecified dementia without behavioral disturbance: Secondary | ICD-10-CM | POA: Diagnosis not present

## 2014-01-27 DIAGNOSIS — K209 Esophagitis, unspecified without bleeding: Secondary | ICD-10-CM | POA: Diagnosis not present

## 2014-01-27 DIAGNOSIS — M6281 Muscle weakness (generalized): Secondary | ICD-10-CM | POA: Diagnosis not present

## 2014-01-27 DIAGNOSIS — M25559 Pain in unspecified hip: Secondary | ICD-10-CM | POA: Diagnosis not present

## 2014-01-27 DIAGNOSIS — M810 Age-related osteoporosis without current pathological fracture: Secondary | ICD-10-CM | POA: Diagnosis not present

## 2014-01-27 DIAGNOSIS — Z79899 Other long term (current) drug therapy: Secondary | ICD-10-CM | POA: Diagnosis not present

## 2014-01-27 DIAGNOSIS — K5900 Constipation, unspecified: Secondary | ICD-10-CM | POA: Diagnosis not present

## 2014-01-27 DIAGNOSIS — T07XXXA Unspecified multiple injuries, initial encounter: Secondary | ICD-10-CM | POA: Diagnosis not present

## 2014-01-27 DIAGNOSIS — F411 Generalized anxiety disorder: Secondary | ICD-10-CM | POA: Diagnosis not present

## 2014-01-27 DIAGNOSIS — F0280 Dementia in other diseases classified elsewhere without behavioral disturbance: Secondary | ICD-10-CM | POA: Diagnosis not present

## 2014-01-27 DIAGNOSIS — S79919A Unspecified injury of unspecified hip, initial encounter: Secondary | ICD-10-CM | POA: Diagnosis not present

## 2014-01-27 DIAGNOSIS — G4700 Insomnia, unspecified: Secondary | ICD-10-CM | POA: Diagnosis not present

## 2014-01-27 DIAGNOSIS — H338 Other retinal detachments: Secondary | ICD-10-CM | POA: Diagnosis not present

## 2014-01-27 DIAGNOSIS — R1314 Dysphagia, pharyngoesophageal phase: Secondary | ICD-10-CM | POA: Diagnosis not present

## 2014-01-27 DIAGNOSIS — S73006A Unspecified dislocation of unspecified hip, initial encounter: Secondary | ICD-10-CM | POA: Diagnosis not present

## 2014-01-27 DIAGNOSIS — I10 Essential (primary) hypertension: Secondary | ICD-10-CM | POA: Diagnosis not present

## 2014-01-27 DIAGNOSIS — D649 Anemia, unspecified: Secondary | ICD-10-CM | POA: Diagnosis not present

## 2014-01-27 DIAGNOSIS — J690 Pneumonitis due to inhalation of food and vomit: Secondary | ICD-10-CM | POA: Diagnosis not present

## 2014-01-27 LAB — BASIC METABOLIC PANEL
BUN: 8 mg/dL (ref 6–23)
CO2: 25 meq/L (ref 19–32)
Calcium: 8 mg/dL — ABNORMAL LOW (ref 8.4–10.5)
Chloride: 99 mEq/L (ref 96–112)
Creatinine, Ser: 0.57 mg/dL (ref 0.50–1.10)
GFR calc Af Amer: >90 mL/min (ref >90)
GFR calc non Af Amer: 79 mL/min — ABNORMAL LOW (ref >90)
Glucose, Bld: 102 mg/dL — ABNORMAL HIGH (ref 70–99)
POTASSIUM: 3.6 meq/L — AB (ref 3.7–5.3)
SODIUM: 137 meq/L (ref 137–147)

## 2014-01-27 LAB — CBC
HCT: 27.8 % — ABNORMAL LOW (ref 36.0–46.0)
Hemoglobin: 9.5 g/dL — ABNORMAL LOW (ref 12.0–15.0)
MCH: 30.7 pg (ref 26.0–34.0)
MCHC: 34.2 g/dL (ref 30.0–36.0)
MCV: 90 fL (ref 78.0–100.0)
Platelets: 270 10*3/uL (ref 150–400)
RBC: 3.09 MIL/uL — AB (ref 3.87–5.11)
RDW: 14.3 % (ref 11.5–15.5)
WBC: 9.1 10*3/uL (ref 4.0–10.5)

## 2014-01-27 MED ORDER — ONDANSETRON HCL 4 MG PO TABS: 4.0000 mg | ORAL_TABLET | Freq: Four times a day (QID) | ORAL | Status: DC | PRN

## 2014-01-27 MED ORDER — ASPIRIN 325 MG PO TBEC: 325.0000 mg | DELAYED_RELEASE_TABLET | Freq: Every day | ORAL | Status: DC

## 2014-01-27 MED ORDER — BISACODYL 10 MG RE SUPP: 10.0000 mg | Freq: Every day | RECTAL | Status: DC | PRN

## 2014-01-27 MED ORDER — HYDROMORPHONE HCL 2 MG PO TABS: 2.0000 mg | ORAL_TABLET | ORAL | Status: DC | PRN

## 2014-01-27 MED ORDER — AMLODIPINE BESYLATE 2.5 MG PO TABS: 2.5000 mg | ORAL_TABLET | Freq: Every day | ORAL | Status: AC

## 2014-01-27 MED ORDER — ALPRAZOLAM 0.25 MG PO TABS: 0.2500 mg | ORAL_TABLET | Freq: Three times a day (TID) | ORAL | Status: DC | PRN

## 2014-01-27 MED ORDER — DSS 100 MG PO CAPS: 100.0000 mg | ORAL_CAPSULE | Freq: Two times a day (BID) | ORAL | Status: AC

## 2014-01-27 MED ORDER — PANTOPRAZOLE SODIUM 40 MG PO TBEC: 40.0000 mg | DELAYED_RELEASE_TABLET | Freq: Every day | ORAL | Status: AC

## 2014-01-27 MED ORDER — METOCLOPRAMIDE HCL 5 MG PO TABS: 5.0000 mg | ORAL_TABLET | Freq: Three times a day (TID) | ORAL | Status: DC | PRN

## 2014-01-27 MED ORDER — METHOCARBAMOL 500 MG PO TABS: 500.0000 mg | ORAL_TABLET | Freq: Four times a day (QID) | ORAL | Status: DC | PRN

## 2014-01-27 MED ORDER — LEVOFLOXACIN 750 MG PO TABS: 750.0000 mg | ORAL_TABLET | ORAL | Status: DC

## 2014-01-27 MED ORDER — GI COCKTAIL ~~LOC~~: 30.0000 mL | Freq: Three times a day (TID) | ORAL | Status: DC | PRN

## 2014-01-27 NOTE — Clinical Social Work Note (Signed)
[facility]. Family and patient agreeable to this plan- confirmed plans with SNF rep and will plan transfer via EMS. Reece LevyJanet Felicha Frayne, MSW, Theresia MajorsLCSWA [phone]

## 2014-01-27 NOTE — Progress Notes (Signed)
1415 attempted to call [facility] twice, rolled to nursing supervisor voice mail  D Susann GivensFranklin RN

## 2014-01-27 NOTE — Discharge Summary (Signed)
Physician Discharge Summary   Patient ID: Loretta Garcia MRN: [id number] DOB/AGE: 1920/05/31 80 y.o.  Admit date: 01/20/2014 Discharge date: 01-27-2014  Primary Diagnosis:  Recurrent right hip dislocation.  Admission Diagnoses:  Past Medical History  Diagnosis Date  . Hypertension   . Osteoporosis   . GERD (gastroesophageal reflux disease)   . Potassium (K) deficiency   . UTI (lower urinary tract infection)     LAST UTI MARCH 2015 ?  Marland Kitchen Stroke     TIA - 20 SOME YRS AGO - STILL HAS TINGLING LEFT LEG - NO OTHER RESIDUALS  . Anxiety   . Dementia     STILL ABLE TO SIGN OWN LEGAL PAPERS  . Arthritis     C/O OF PAIN IN MIDDLE BACK - PAST HX OF FRACTURE / OSTEOPOROSIS  . Recurrent falls     USES [facility] - LIVING INDEPENDENTLY IN APARTMENT AT [place] WITH HIP DISLOCATION- IS CURRENTLY AT [facility]  [phone]   Discharge Diagnoses:   Principal Problem:   Dislocation of hip joint prosthesis Active Problems:   Postoperative anemia due to acute blood loss   Unspecified essential hypertension   GERD (gastroesophageal reflux disease)   Hypopotassemia   Aspiration pneumonia   Nonspecific (abnormal) findings on radiological and other examination of gastrointestinal tract   Other dysphagia  Estimated body mass index is 20.36 kg/(m^2) as calculated from the following:   Height as of this encounter: 5' (1.524 m).   Weight as of this encounter: 47.287 kg (104 lb 4 oz).  Procedure(s) (LRB): ESOPHAGOGASTRODUODENOSCOPY (EGD) (N/A)   Consults: GI and Hospitalists  HPI: [name] is a 80 yo female who presents with right hip pain secondary to a dislocation. She had a major revision THA approximately 15 years ago with allografting of the femur and acetabulum and did well until 2 weeks ago when she dislocated the hip on 2 consecutive days. She did not have any problems prior to those episodes. I saw her in the office last week and she complained of shortening  of her right leg and radiographs showed another dislocation. She was comfortable and neurovascularly intact thus we did not proceed with immediate operative reduction. It was decided to proceed with revision to a constrained liner and the instruments had to be special ordered given the earlier generation of that particular prosthesis. She presents today for revision to a constrained acetabulum.  Laboratory Data: Admission on 01/20/2014  Component Date Value Ref Range Status  . MRSA, PCR 01/20/2014 NEGATIVE  NEGATIVE Final  . Staphylococcus aureus 01/20/2014 NEGATIVE  NEGATIVE Final   Comment:                                 The Xpert SA Assay (FDA                          approved for NASAL specimens                          in patients over 44 years of age),                          is one component of  a comprehensive surveillance                          program.  Test performance has                          been validated by [facility] for patients greater                          than or equal to 42 year old.                          It is not intended                          to diagnose infection nor to                          guide or monitor treatment.  Marland Kitchen aPTT 01/20/2014 26  24 - 37 seconds Final  . Sodium 01/20/2014 138  137 - 147 mEq/L Final  . Potassium 01/20/2014 3.0* 3.7 - 5.3 mEq/L Final  . Chloride 01/20/2014 96  96 - 112 mEq/L Final  . CO2 01/20/2014 30  19 - 32 mEq/L Final  . Glucose, Bld 01/20/2014 82  70 - 99 mg/dL Final  . BUN 01/20/2014 17  6 - 23 mg/dL Final  . Creatinine, Ser 01/20/2014 0.57  0.50 - 1.10 mg/dL Final  . Calcium 01/20/2014 9.9  8.4 - 10.5 mg/dL Final  . Total Protein 01/20/2014 6.1  6.0 - 8.3 g/dL Final  . Albumin 01/20/2014 2.9* 3.5 - 5.2 g/dL Final  . AST 01/20/2014 20  0 - 37 U/L Final  . ALT 01/20/2014 13  0 - 35 U/L Final  . Alkaline Phosphatase 01/20/2014 62  39 - 117 U/L Final  . Total  Bilirubin 01/20/2014 0.3  0.3 - 1.2 mg/dL Final  . GFR calc non Af Amer 01/20/2014 79* >90 mL/min Final  . GFR calc Af Amer 01/20/2014 >90  >90 mL/min Final   Comment: (NOTE)                          The eGFR has been calculated using the CKD EPI equation.                          This calculation has not been validated in all clinical situations.                          eGFR's persistently <90 mL/min signify possible Chronic Kidney                          Disease.  Marland Kitchen Prothrombin Time 01/20/2014 12.9  11.6 - 15.2 seconds Final  . INR 01/20/2014 0.99  0.00 - 1.49 Final  . ABO/RH(D) 01/20/2014 O POS   Final  . Antibody Screen 01/20/2014 NEG   Final  . Sample Expiration 01/20/2014 01/23/2014   Final  . WBC 01/21/2014 8.5  4.0 - 10.5 K/uL Final  . RBC  01/21/2014 3.40* 3.87 - 5.11 MIL/uL Final  . Hemoglobin 01/21/2014 10.8* 12.0 - 15.0 g/dL Final  . HCT 01/21/2014 31.0* 36.0 - 46.0 % Final  . MCV 01/21/2014 91.2  78.0 - 100.0 fL Final  . MCH 01/21/2014 31.8  26.0 - 34.0 pg Final  . MCHC 01/21/2014 34.8  30.0 - 36.0 g/dL Final  . RDW 01/21/2014 14.1  11.5 - 15.5 % Final  . Platelets 01/21/2014 248  150 - 400 K/uL Final  . Sodium 01/21/2014 136* 137 - 147 mEq/L Final  . Potassium 01/21/2014 3.3* 3.7 - 5.3 mEq/L Final  . Chloride 01/21/2014 97  96 - 112 mEq/L Final  . CO2 01/21/2014 30  19 - 32 mEq/L Final  . Glucose, Bld 01/21/2014 143* 70 - 99 mg/dL Final  . BUN 01/21/2014 14  6 - 23 mg/dL Final  . Creatinine, Ser 01/21/2014 0.57  0.50 - 1.10 mg/dL Final  . Calcium 01/21/2014 8.6  8.4 - 10.5 mg/dL Final  . GFR calc non Af Amer 01/21/2014 79* >90 mL/min Final  . GFR calc Af Amer 01/21/2014 >90  >90 mL/min Final   Comment: (NOTE)                          The eGFR has been calculated using the CKD EPI equation.                          This calculation has not been validated in all clinical situations.                          eGFR's persistently <90 mL/min signify possible Chronic Kidney                           Disease.  . WBC 01/22/2014 10.9* 4.0 - 10.5 K/uL Final  . RBC 01/22/2014 2.98* 3.87 - 5.11 MIL/uL Final  . Hemoglobin 01/22/2014 9.3* 12.0 - 15.0 g/dL Final  . HCT 01/22/2014 26.9* 36.0 - 46.0 % Final  . MCV 01/22/2014 90.3  78.0 - 100.0 fL Final  . MCH 01/22/2014 31.2  26.0 - 34.0 pg Final  . MCHC 01/22/2014 34.6  30.0 - 36.0 g/dL Final  . RDW 01/22/2014 14.0  11.5 - 15.5 % Final  . Platelets 01/22/2014 221  150 - 400 K/uL Final  . Sodium 01/22/2014 134* 137 - 147 mEq/L Final  . Potassium 01/22/2014 3.8  3.7 - 5.3 mEq/L Final  . Chloride 01/22/2014 98  96 - 112 mEq/L Final  . CO2 01/22/2014 28  19 - 32 mEq/L Final  . Glucose, Bld 01/22/2014 143* 70 - 99 mg/dL Final  . BUN 01/22/2014 16  6 - 23 mg/dL Final  . Creatinine, Ser 01/22/2014 0.58  0.50 - 1.10 mg/dL Final  . Calcium 01/22/2014 8.8  8.4 - 10.5 mg/dL Final  . GFR calc non Af Amer 01/22/2014 78* >90 mL/min Final  . GFR calc Af Amer 01/22/2014 >90  >90 mL/min Final   Comment: (NOTE)                          The eGFR has been calculated using the CKD EPI equation.                          This calculation  has not been validated in all clinical situations.                          eGFR's persistently <90 mL/min signify possible Chronic Kidney                          Disease.  . WBC 01/23/2014 10.1  4.0 - 10.5 K/uL Final  . RBC 01/23/2014 3.11* 3.87 - 5.11 MIL/uL Final  . Hemoglobin 01/23/2014 9.7* 12.0 - 15.0 g/dL Final  . HCT 01/23/2014 28.4* 36.0 - 46.0 % Final  . MCV 01/23/2014 91.3  78.0 - 100.0 fL Final  . MCH 01/23/2014 31.2  26.0 - 34.0 pg Final  . MCHC 01/23/2014 34.2  30.0 - 36.0 g/dL Final  . RDW 01/23/2014 14.4  11.5 - 15.5 % Final  . Platelets 01/23/2014 233  150 - 400 K/uL Final  . Sodium 01/23/2014 138  137 - 147 mEq/L Final  . Potassium 01/23/2014 3.8  3.7 - 5.3 mEq/L Final  . Chloride 01/23/2014 100  96 - 112 mEq/L Final  . CO2 01/23/2014 27  19 - 32 mEq/L Final  . Glucose, Bld  01/23/2014 150* 70 - 99 mg/dL Final  . BUN 01/23/2014 14  6 - 23 mg/dL Final  . Creatinine, Ser 01/23/2014 0.55  0.50 - 1.10 mg/dL Final  . Calcium 01/23/2014 8.5  8.4 - 10.5 mg/dL Final  . GFR calc non Af Amer 01/23/2014 80* >90 mL/min Final  . GFR calc Af Amer 01/23/2014 >90  >90 mL/min Final   Comment: (NOTE)                          The eGFR has been calculated using the CKD EPI equation.                          This calculation has not been validated in all clinical situations.                          eGFR's persistently <90 mL/min signify possible Chronic Kidney                          Disease.  Marland Kitchen Specimen Description 01/23/2014 BLOOD LEFT ANTECUBITAL   Final  . Special Requests 01/23/2014 BOTTLES DRAWN AEROBIC ONLY 4CC   Final  . Culture  Setup Time 01/23/2014    Final                   Value:01/23/2014 08:56                         Performed at [facility]  . Culture 01/23/2014    Final                   Value:       BLOOD CULTURE RECEIVED NO GROWTH TO DATE CULTURE WILL BE HELD FOR 5 DAYS BEFORE ISSUING A FINAL NEGATIVE REPORT                         Performed at [facility]  . Report Status 01/23/2014 PENDING   Incomplete  . Specimen Description 01/23/2014 BLOOD LEFTHAND   Final  . Special Requests 01/23/2014  BOTTLES DRAWN AEROBIC ONLY 3CC   Final  . Culture  Setup Time 01/23/2014    Final                   Value:01/23/2014 08:56                         Performed at [facility]  . Culture 01/23/2014    Final                   Value:       BLOOD CULTURE RECEIVED NO GROWTH TO DATE CULTURE WILL BE HELD FOR 5 DAYS BEFORE ISSUING A FINAL NEGATIVE REPORT                         Performed at [facility]  . Report Status 01/23/2014 PENDING   Incomplete  . WBC 01/25/2014 8.4  4.0 - 10.5 K/uL Final  . RBC 01/25/2014 2.77* 3.87 - 5.11 MIL/uL Final  . Hemoglobin 01/25/2014 8.7* 12.0 - 15.0 g/dL Final  . HCT 01/25/2014 25.4* 36.0 - 46.0 % Final  .  MCV 01/25/2014 91.7  78.0 - 100.0 fL Final  . MCH 01/25/2014 31.4  26.0 - 34.0 pg Final  . MCHC 01/25/2014 34.3  30.0 - 36.0 g/dL Final  . RDW 01/25/2014 14.2  11.5 - 15.5 % Final  . Platelets 01/25/2014 261  150 - 400 K/uL Final  . Sodium 01/25/2014 139  137 - 147 mEq/L Final  . Potassium 01/25/2014 2.8* 3.7 - 5.3 mEq/L Final   Comment: CRITICAL RESULT CALLED TO, READ BACK BY AND VERIFIED WITH:                          T COUNCIL AT 1141 ON 04.18.2015 BY NBROOKS  . Chloride 01/25/2014 102  96 - 112 mEq/L Final  . CO2 01/25/2014 27  19 - 32 mEq/L Final  . Glucose, Bld 01/25/2014 128* 70 - 99 mg/dL Final  . BUN 01/25/2014 6  6 - 23 mg/dL Final  . Creatinine, Ser 01/25/2014 0.52  0.50 - 1.10 mg/dL Final  . Calcium 01/25/2014 7.7* 8.4 - 10.5 mg/dL Final  . Total Protein 01/25/2014 5.2* 6.0 - 8.3 g/dL Final  . Albumin 01/25/2014 2.0* 3.5 - 5.2 g/dL Final  . AST 01/25/2014 23  0 - 37 U/L Final  . ALT 01/25/2014 17  0 - 35 U/L Final  . Alkaline Phosphatase 01/25/2014 46  39 - 117 U/L Final  . Total Bilirubin 01/25/2014 0.5  0.3 - 1.2 mg/dL Final  . GFR calc non Af Amer 01/25/2014 81* >90 mL/min Final  . GFR calc Af Amer 01/25/2014 >90  >90 mL/min Final   Comment: (NOTE)                          The eGFR has been calculated using the CKD EPI equation.                          This calculation has not been validated in all clinical situations.                          eGFR's persistently <90 mL/min signify possible Chronic Kidney  Disease.  . Magnesium 01/25/2014 1.4* 1.5 - 2.5 mg/dL Final  . Phosphorus 01/25/2014 1.4* 2.3 - 4.6 mg/dL Final  . Sodium 01/26/2014 135* 137 - 147 mEq/L Final  . Potassium 01/26/2014 3.6* 3.7 - 5.3 mEq/L Final   Comment: DELTA CHECK NOTED                          REPEATED TO VERIFY                          NO VISIBLE HEMOLYSIS  . Chloride 01/26/2014 98  96 - 112 mEq/L Final  . CO2 01/26/2014 26  19 - 32 mEq/L Final  . Glucose, Bld  01/26/2014 114* 70 - 99 mg/dL Final  . BUN 01/26/2014 5* 6 - 23 mg/dL Final  . Creatinine, Ser 01/26/2014 0.47* 0.50 - 1.10 mg/dL Final  . Calcium 01/26/2014 8.0* 8.4 - 10.5 mg/dL Final  . GFR calc non Af Amer 01/26/2014 84* >90 mL/min Final  . GFR calc Af Amer 01/26/2014 >90  >90 mL/min Final   Comment: (NOTE)                          The eGFR has been calculated using the CKD EPI equation.                          This calculation has not been validated in all clinical situations.                          eGFR's persistently <90 mL/min signify possible Chronic Kidney                          Disease.  . WBC 01/27/2014 9.1  4.0 - 10.5 K/uL Final  . RBC 01/27/2014 3.09* 3.87 - 5.11 MIL/uL Final  . Hemoglobin 01/27/2014 9.5* 12.0 - 15.0 g/dL Final  . HCT 01/27/2014 27.8* 36.0 - 46.0 % Final  . MCV 01/27/2014 90.0  78.0 - 100.0 fL Final  . MCH 01/27/2014 30.7  26.0 - 34.0 pg Final  . MCHC 01/27/2014 34.2  30.0 - 36.0 g/dL Final  . RDW 01/27/2014 14.3  11.5 - 15.5 % Final  . Platelets 01/27/2014 270  150 - 400 K/uL Final  . Sodium 01/27/2014 137  137 - 147 mEq/L Final  . Potassium 01/27/2014 3.6* 3.7 - 5.3 mEq/L Final  . Chloride 01/27/2014 99  96 - 112 mEq/L Final  . CO2 01/27/2014 25  19 - 32 mEq/L Final  . Glucose, Bld 01/27/2014 102* 70 - 99 mg/dL Final  . BUN 01/27/2014 8  6 - 23 mg/dL Final  . Creatinine, Ser 01/27/2014 0.57  0.50 - 1.10 mg/dL Final  . Calcium 01/27/2014 8.0* 8.4 - 10.5 mg/dL Final  . GFR calc non Af Amer 01/27/2014 79* >90 mL/min Final  . GFR calc Af Amer 01/27/2014 >90  >90 mL/min Final   Comment: (NOTE)                          The eGFR has been calculated using the CKD EPI equation.                          This calculation has not  been validated in all clinical situations.                          eGFR's persistently <90 mL/min signify possible Chronic Kidney                          Disease.  Admission on 01/07/2014, Discharged on 01/07/2014  Component  Date Value Ref Range Status  . WBC 01/07/2014 6.1  4.0 - 10.5 K/uL Final  . RBC 01/07/2014 4.28  3.87 - 5.11 MIL/uL Final  . Hemoglobin 01/07/2014 13.3  12.0 - 15.0 g/dL Final  . HCT 01/07/2014 38.3  36.0 - 46.0 % Final  . MCV 01/07/2014 89.5  78.0 - 100.0 fL Final  . MCH 01/07/2014 31.1  26.0 - 34.0 pg Final  . MCHC 01/07/2014 34.7  30.0 - 36.0 g/dL Final  . RDW 01/07/2014 13.7  11.5 - 15.5 % Final  . Platelets 01/07/2014 229  150 - 400 K/uL Final  . Neutrophils Relative % 01/07/2014 66  43 - 77 % Final  . Neutro Abs 01/07/2014 4.0  1.7 - 7.7 K/uL Final  . Lymphocytes Relative 01/07/2014 22  12 - 46 % Final  . Lymphs Abs 01/07/2014 1.4  0.7 - 4.0 K/uL Final  . Monocytes Relative 01/07/2014 11  3 - 12 % Final  . Monocytes Absolute 01/07/2014 0.6  0.1 - 1.0 K/uL Final  . Eosinophils Relative 01/07/2014 1  0 - 5 % Final  . Eosinophils Absolute 01/07/2014 0.1  0.0 - 0.7 K/uL Final  . Basophils Relative 01/07/2014 0  0 - 1 % Final  . Basophils Absolute 01/07/2014 0.0  0.0 - 0.1 K/uL Final  . Sodium 01/07/2014 130* 137 - 147 mEq/L Final  . Potassium 01/07/2014 3.4* 3.7 - 5.3 mEq/L Final  . Chloride 01/07/2014 89* 96 - 112 mEq/L Final  . CO2 01/07/2014 29  19 - 32 mEq/L Final  . Glucose, Bld 01/07/2014 127* 70 - 99 mg/dL Final  . BUN 01/07/2014 14  6 - 23 mg/dL Final  . Creatinine, Ser 01/07/2014 0.48* 0.50 - 1.10 mg/dL Final  . Calcium 01/07/2014 9.4  8.4 - 10.5 mg/dL Final  . GFR calc non Af Amer 01/07/2014 83* >90 mL/min Final  . GFR calc Af Amer 01/07/2014 >90  >90 mL/min Final   Comment: (NOTE)                          The eGFR has been calculated using the CKD EPI equation.                          This calculation has not been validated in all clinical situations.                          eGFR's persistently <90 mL/min signify possible Chronic Kidney                          Disease.     X-Rays:Dg Hip Complete Right  01/08/2014   CLINICAL DATA:  Hip pain.  EXAM: RIGHT HIP -  COMPLETE 2+ VIEW  COMPARISON:  DG HIP COMPLETE*R* dated 01/07/2014  FINDINGS: Continued superior dislocation of the femoral component, right total hip arthroplasty. No acute fracture. Unremarkable appearing left hip arthroplasty. Osteopenia.  IMPRESSION:  There has been no interval reduction in the superiorly dislocated right THR.   Electronically Signed   By: Rolla Flatten M.D.   On: 01/08/2014 10:12   Dg Hip Complete Right  01/07/2014   CLINICAL DATA:  Fall, right hip pain  EXAM: RIGHT HIP - COMPLETE 2+ VIEW  COMPARISON:  Prior radiograph from 09/06/2010  FINDINGS: Bilateral total hip arthroplasties are in place. The femoral component of the right hip arthroplasty is dislocated superiorly relative to its acetabular component. No acute fracture identified. Left hip arthroplasty is in normal anatomic alignment.  Diffuse osteopenia noted. Severe degenerative changes present within the lower lumbar spine. No soft tissue abnormality.  IMPRESSION: 1. Superior dislocation of the right total hip arthroplasty. 2. No acute fracture identified. 3. Diffuse osteopenia.   Electronically Signed   By: Jeannine Boga M.D.   On: 01/07/2014 15:23   Ct Head Wo Contrast  01/07/2014   CLINICAL DATA:  Fall  EXAM: CT HEAD WITHOUT CONTRAST  TECHNIQUE: Contiguous axial images were obtained from the base of the skull through the vertex without intravenous contrast.  COMPARISON:  Prior CT from 10/02/2009  FINDINGS: Advanced age-related atrophy with chronic microvascular ischemic disease is present. Remote bilateral cerebellar infarcts noted, new as compared to most recent exam.  There is no acute intracranial hemorrhage or infarct. No mass lesion or midline shift. Gray-white matter differentiation is well maintained. Ventricles are normal in size without evidence of hydrocephalus. No extra-axial fluid collection.  The calvarium is intact.  Postsurgical changes noted at the right orbit. Globes are otherwise unremarkable.  The  paranasal sinuses and mastoid air cells are well pneumatized and free of fluid.  Small left parietal scalp contusion is present.  IMPRESSION: 1. Left parieto-occipital scalp contusion. No acute intracranial process. 2. Advanced age related atrophy with chronic microvascular ischemic disease. 3. Remote bilateral cerebellar infarcts.   Electronically Signed   By: Jeannine Boga M.D.   On: 01/07/2014 15:30   Ct Angio Chest Pe W/cm &/or Wo Cm  01/22/2014   CLINICAL DATA:  Short of breath.  Confusion.  EXAM: CT ANGIOGRAPHY CHEST WITH CONTRAST  TECHNIQUE: Multidetector CT imaging of the chest was performed using the standard protocol during bolus administration of intravenous contrast. Multiplanar CT image reconstructions and MIPs were obtained to evaluate the vascular anatomy.  CONTRAST:  159m OMNIPAQUE IOHEXOL 350 MG/ML SOLN  COMPARISON:  07/06/2012  FINDINGS: Pulmonary arterial opacification is good. There are no pulmonary emboli. There is atherosclerosis of the aorta but no aneurysm or dissection.  There is bilateral dependent lower lobe pneumonia suggesting aspiration. There is a small amount of pleural fluid on the left. The esophagus is markedly dilated and fluid-filled.  No mediastinal mass or adenopathy. The non involved portions of the lung are clear and normal.  There is a compression fracture at T12 which is old. No acute bony finding.  Review of the MIP images confirms the above findings.  IMPRESSION: No pulmonary emboli.  Bilateral dependent lower lobe bronchopneumonia, probably due to aspiration. Dilated, fluid-filled esophagus.   Electronically Signed   By: MNelson ChimesM.D.   On: 01/22/2014 23:34   Dg Pelvis Portable  01/21/2014   CLINICAL DATA:  Revision right total hip replacement  EXAM: PORTABLE PELVIS 1-2 VIEWS  COMPARISON:  01/07/2014  FINDINGS: There has been extensive revision of the acetabular component. Three screws are now in place. Acetabular component does show some protrusio, not  seen on the previous film.  IMPRESSION: Acetabular component revision  on the right.   Electronically Signed   By: Nelson Chimes M.D.   On: 01/21/2014 00:22   Dg Pelvis Portable  01/07/2014   CLINICAL DATA:  Post reduction right hip  EXAM: PORTABLE PELVIS 1-2 VIEWS  COMPARISON:  01/07/2014  FINDINGS: Portable AP pelvis performed. There is improved alignment of the right femoral prosthetic component in relation to the acetabular cup in the frontal projection. Postop changes of both hips noted. Bones are osteopenic. Degenerative changes of the lower lumbar spine and pelvis. Nonobstructive bowel gas pattern.  IMPRESSION: Reduced right hip prosthesis with improved alignment.   Electronically Signed   By: Daryll Brod M.D.   On: 01/07/2014 17:42   Dg Hip Portable 1 View Right  01/08/2014   CLINICAL DATA:  Post reduction.  EXAM: PORTABLE RIGHT HIP - 1 VIEW  COMPARISON:  DG HIP COMPLETE*R* dated 01/08/2014  FINDINGS: Interval reduction of superiorly dislocated right hip: AP portable view. Patient has had total right hip replacement. No acute bony abnormality identified.  IMPRESSION: Interval relocation of right hip on AP portable view.   Electronically Signed   By: Marcello Moores  Register   On: 01/08/2014 13:53    EKG: Orders placed during the hospital encounter of 01/20/14  . EKG 12-LEAD  . EKG 12-LEAD  . EKG 12-LEAD  . EKG 12-LEAD  . EKG 12-LEAD  . EKG 12-LEAD     Hospital Course:  Ms. Huizar is a 80 yo female who presents with right hip pain secondary to a dislocation. She had a major revision THA approximately 15 years ago with allografting of the femur and acetabulum and did well until 2 weeks ago when she dislocated the hip on 2 consecutive days. She did not have any problems prior to those episodes. Patient was admitted to [facility] and taken to the OR and underwent the above state procedure without complications.  Patient tolerated the procedure well and was later transferred to the recovery  room and then to the orthopaedic floor for postoperative care.  They were given PO and IV analgesics for pain control following their surgery.  They were given 24 hours of postoperative antibiotics of  Anti-infectives   Start     Dose/Rate Route Frequency Ordered Stop   01/27/14 0000  levofloxacin (LEVAQUIN) 750 MG tablet     750 mg Oral Every 48 hours 01/27/14 1042     01/26/14 1600  levofloxacin (LEVAQUIN) tablet 750 mg     750 mg Oral Every 48 hours 01/26/14 1446     01/24/14 0930  fluconazole (DIFLUCAN) tablet 100 mg     100 mg Oral Daily 01/24/14 0919     01/23/14 0115  ceFEPIme (MAXIPIME) 2 g in dextrose 5 % 50 mL IVPB  Status:  Discontinued     2 g 100 mL/hr over 30 Minutes Intravenous Daily at bedtime 01/23/14 0104 01/26/14 1433   01/23/14 0115  vancomycin (VANCOCIN) IVPB 750 mg/150 ml premix  Status:  Discontinued     750 mg 150 mL/hr over 60 Minutes Intravenous Daily at bedtime 01/23/14 0106 01/26/14 1433   01/21/14 0600  vancomycin (VANCOCIN) IVPB 1000 mg/200 mL premix     1,000 mg 200 mL/hr over 60 Minutes Intravenous Every 12 hours 01/20/14 2132 01/21/14 0642   01/20/14 1730  vancomycin (VANCOCIN) IVPB 1000 mg/200 mL premix     1,000 mg 200 mL/hr over 60 Minutes Intravenous  Once 01/20/14 1722 01/20/14 1824   01/20/14 1315  ceFAZolin (ANCEF) IVPB 2  g/50 mL premix  Status:  Discontinued     2 g 100 mL/hr over 30 Minutes Intravenous On call to O.R. 01/20/14 1255 01/20/14 1722     and started on DVT prophylaxis in the form of Aspirin.   PT and OT were ordered for total hip protocol.  The patient was allowed to be WBAT with therapy. Discharge planning was consulted to help with postop disposition and equipment needs.  Patient had a tough night on the evening of surgery with pain.  She wanted to go home following the surgery.  She lived in independent living at [facility]. They started to get up OOB with therapy on day one but required +2 assist.  Social worker became involved  since it was felt that she might need skilled rehab for a short time after release.    POD 2 - Continued to work with therapy into day two and was doing a little better.  She still wanted to go back to her apartment at [facility] but would consider the rehab unit for a short term before back home.  Dressing was changed on day two and the incision was healing well.  Unfortunately, that night she was noted to be hypoxic and was sent for a CT scan to rule out Pulmonary embolism. Hospitalist/Medicine Consult called. 1. Pneumonia -Patient likely has an aspiration event  -will start on cefepime at this time  -will also give her a dose of vancomycin  -swallow study will get SLP  -blood cultures drawn  Pharmacy Consult for Cefepime and Vancomycin  POD 3 - By day three, patient reported pain as mild. Patient seen in rounds with Dr. Wynelle Link. Sitter in room. Patient has had problems thru the night. Epsiode of N/V. Questionalble aspiration. CT scan ordered due to hypoxia. Scan showed bilateral PMN.  Felt that she would need to go to the skilled unit at [facility]. GI consult -  Impression / Plan:   48. 80 year old female with right hip discolation, s/p right acetabular revision 01/20/14.  2. Acute on chronic (intermittent) dysphagia. She is coughing and regurgitating with meals. Now with probable aspiration PNA. CTscan does show fluid filled, dilated esophagus. SLP evaluated and feels dysphagia primarily esophageal. Patient was dilated empirically (maybe twice) several years ago by Dr. Lajoyce Corners. She has not complained of recurrent dysphagia until this hospitalization and weight has been stable. For further evaluation patient will be scheduled for EGD with possible dilation. We will need phone consent from daughter. Continue PPI.  3. [place] anemia, ? Post op? She is on oral iron.  4. Low grade temp (100.6), probably related to aspiration PNA. On antibiotics.   POD 4 -  ENDOSCOPY PROCEDURE ENDOSCOPIC  IMPRESSION:  1. Scattered white exudate throughout the esophagus. Marked  exudates and friable mucosa in the mid esophagus-biopsied.  2. The EGD was otherwise normal.  RECOMMENDATIONS:  1. Anti-reflux regimen  2. Continue PPI  3. Await pathology results  4. Post dilation instructions  5. Presumed candida esophagitis and possible pill esophagitis.  Begin Diflucan and DC esophageal irritants-Fe and KCl  6. If dysphagia persists after discharge consider outpatient  esophageal manometry  Medicine Service Assessment/Plan:  Principal Problem:  Dislocation of hip joint prosthesis  - Orthopedic surgery on board and managing. Patient is status post right hip acetabular revision  - Continue physical therapy  Active Problems:  Postoperative anemia due to acute blood loss  - Stable with no active bleeding and hemoglobin improved from 9.3-9.7  Unspecified  essential hypertension  - Relatively well controlled we'll continue to monitor blood pressures remain elevated will consider placing on antihypertensive regimen.  GERD (gastroesophageal reflux disease)  - Patient on protonic, stable  Hypopotassemia  - Potassium within normal limits  Aspiration pneumonia  - Secondary to dysphasia.  - Patient is status post dilation  - Continue broad-spectrum antibiotic  Other dysphagia  - After endoscopy currently GI presuming Candida esophagitis and possible pill esophagitis Diflucan was started and esophageal irritants like iron and potassium chloride were discontinued  POD 5 - Patient reported pain as mild.More alert that AM  Plan was to go Skilled nursing facility after hospital stay.  Continued broad-spectrum antibiotic(D3)   POD 6 - Pleasantly confused, sitter in room. No events reported.  Discharge to SNF when stable medically.  Narrowed antibiotic regimen and placed on levaquin.  Diflucan was started and esophageal irritants like iron and potassium chloride were discontinued  POD 7 - Placed on  levaquin may continue for 2 more days after discharge.  Diflucan per GI recommendations.  GI has recommended holding off on oral K supplementation given esophagitis.  Cleared medically for discharge to SNF  Plan to transfer and go to the skilled unit at [facility].    Diet: Cardiac diet Activity:WBAT No bending hip over 90 degrees- A "L" Angle Do not cross legs Do not let foot roll inward When turning these patients a pillow should be placed between the patient's legs to prevent crossing. Patients should have the affected knee fully extended when trying to sit or stand from all surfaces to prevent excessive hip flexion. When ambulating and turning toward the affected side the affected leg should have the toes turned out prior to moving the walker and the rest of patient's body as to prevent internal rotation/ turning in of the leg. Abduction pillows are the most effective way to prevent a patient from not crossing legs or turning toes in at rest. If an abduction pillow is not ordered placing a regular pillow length wise between the patient's legs is also an effective reminder. It is imperative that these precautions be maintained so that the surgical hip does not dislocate. Follow-up:in 2 weeks Disposition - Skilled nursing facility Discharged Condition: stable       Discharge Orders   Future Orders Complete By Expires   Call MD / Call 911  As directed    Change dressing  As directed    Constipation Prevention  As directed    Diet - low sodium heart healthy  As directed    Discharge instructions  As directed    Do not sit on low chairs, stoools or toilet seats, as it may be difficult to get up from low surfaces  As directed    Driving restrictions  As directed    Follow the hip precautions as taught in Physical Therapy  As directed    Increase activity slowly as tolerated  As directed    Lifting restrictions  As directed    Patient may shower  As directed    TED hose   As directed    Weight bearing as tolerated  As directed    Questions:     Laterality:     Extremity:         Medication List    STOP taking these medications       APPLE CIDER VINEGAR PO     BIOTIN 5000 PO     CALCIUM CITRATE + D 315-250 MG-UNIT Tabs  Generic drug:  Calcium Citrate-Vitamin D     CRANBERRY-VITAMIN C PO     Cyanocobalamin 2500 MCG Chew     multivitamin with minerals Tabs tablet     Potassium Chloride CR 8 MEQ Cpcr capsule CR  Commonly known as:  MICRO-K     vitamin C 500 MG tablet  Commonly known as:  ASCORBIC ACID      TAKE these medications       acetaminophen 500 MG tablet  Commonly known as:  TYLENOL  Take 1,000 mg by mouth 3 (three) times daily.     ALPRAZolam 0.25 MG tablet  Commonly known as:  XANAX  Take 1 tablet (0.25 mg total) by mouth 3 (three) times daily as needed for anxiety.     amLODipine 2.5 MG tablet  Commonly known as:  NORVASC  Take 1 tablet (2.5 mg total) by mouth daily.     aspirin 325 MG EC tablet  - Take 1 tablet (325 mg total) by mouth daily with breakfast. Take Aspirin 359m for two and a half more weeks, then discontinue Full Dose Aspirin.  - Once the patient has completed the Full Dose 325 mg Aspirin, they may resume the 81 mg Aspirin.     bisacodyl 10 MG suppository  Commonly known as:  DULCOLAX  Place 1 suppository (10 mg total) rectally daily as needed for moderate constipation.     DSS 100 MG Caps  Take 100 mg by mouth 2 (two) times daily.     HYDROmorphone 2 MG tablet  Commonly known as:  DILAUDID  Take 1 tablet (2 mg total) by mouth every 4 (four) hours as needed for severe pain.     indapamide 1.25 MG tablet  Commonly known as:  LOZOL  Take 1.25 mg by mouth every morning.     levofloxacin 750 MG tablet  Commonly known as:  LEVAQUIN  Take 1 tablet (750 mg total) by mouth every other day. Take for two more doses and then discontinue Levaquin.     Melatonin 5 MG Tabs  Take 5 mg by mouth at bedtime as  needed (Sleep).     methocarbamol 500 MG tablet  Commonly known as:  ROBAXIN  Take 1 tablet (500 mg total) by mouth every 6 (six) hours as needed for muscle spasms.     metoCLOPramide 5 MG tablet  Commonly known as:  REGLAN  Take 1-2 tablets (5-10 mg total) by mouth every 8 (eight) hours as needed for nausea (if ondansetron (ZOFRAN) ineffective.).     ofloxacin 0.3 % ophthalmic solution  Commonly known as:  OCUFLOX  Place 1 drop into the right eye 2 (two) times daily.     ondansetron 4 MG tablet  Commonly known as:  ZOFRAN  Take 1 tablet (4 mg total) by mouth every 6 (six) hours as needed for nausea.     pantoprazole 40 MG tablet  Commonly known as:  PROTONIX  Take 1 tablet (40 mg total) by mouth daily.     polyethylene glycol packet  Commonly known as:  MIRALAX / GLYCOLAX  Take 17 g by mouth every other day.     sertraline 50 MG tablet  Commonly known as:  ZOLOFT  Take 50 mg by mouth every morning.       Follow-up Information   Follow up with AGearlean Alf MD. Schedule an appointment as soon as possible for a visit in 2 weeks.   Specialty:  Orthopedic Surgery   Contact information:  [address] [place] [address] [phone]       Signed: Mickel Crow 01/27/2014, 11:21 AM

## 2014-01-27 NOTE — Progress Notes (Signed)
   Subjective: 3 Days Post-Op Procedure(s) (LRB): ESOPHAGOGASTRODUODENOSCOPY (EGD) (N/A)  POD 7  RIGHT HIP ACETABULAR REVISION (Right) Patient reports pain as mild.   Patient seen in rounds by Dr. Lequita HaltAluisio. Patient is well, and has had no acute complaints or problems Plan is to go Skilled nursing facility after hospital stay as per Medicine.  Objective: Vital signs in last 24 hours: Temp:  [97.8 F (36.6 C)-99.3 F (37.4 C)] 99.3 F (37.4 C) (04/20 0430) Pulse Rate:  [80-96] 87 (04/20 0430) Resp:  [18-20] 20 (04/20 0430) BP: (119-149)/(66-81) 119/66 mmHg (04/20 0430) SpO2:  [92 %] 92 % (04/20 0430)  Intake/Output from previous day:  Intake/Output Summary (Last 24 hours) at 01/27/14 0724 Last data filed at 01/27/14 0656  Gross per 24 hour  Intake 1454.75 ml  Output    150 ml  Net 1304.75 ml    Intake/Output this shift:    Labs:  Recent Labs  01/25/14 1036 01/27/14 0445  HGB 8.7* 9.5*    Recent Labs  01/25/14 1036 01/27/14 0445  WBC 8.4 9.1  RBC 2.77* 3.09*  HCT 25.4* 27.8*  PLT 261 270    Recent Labs  01/26/14 0438 01/27/14 0445  NA 135* 137  K 3.6* 3.6*  CL 98 99  CO2 26 25  BUN 5* 8  CREATININE 0.47* 0.57  GLUCOSE 114* 102*  CALCIUM 8.0* 8.0*   No results found for this basename: LABPT, INR,  in the last 72 hours  EXAM General - Patient is Alert Extremity - Neurovascular intact Sensation intact distally Dressing/Incision - clean, dry, no drainage Motor Function - intact, moving foot and toes well on exam.   Past Medical History  Diagnosis Date  . Hypertension   . Osteoporosis   . GERD (gastroesophageal reflux disease)   . Potassium (K) deficiency   . UTI (lower urinary tract infection)     LAST UTI MARCH 2015 ?  Marland Kitchen. Stroke     TIA - 20 SOME YRS AGO - STILL HAS TINGLING LEFT LEG - NO OTHER RESIDUALS  . Anxiety   . Dementia     STILL ABLE TO SIGN OWN LEGAL PAPERS  . Arthritis     C/O OF PAIN IN MIDDLE BACK - PAST HX OF FRACTURE /  OSTEOPOROSIS  . Recurrent falls     USES WALKER - [facility] UNTIL RECENT PROB WITH HIP DISLOCATION- [facility]  [phone]    Assessment/Plan: 3 Days Post-Op Procedure(s) (LRB): ESOPHAGOGASTRODUODENOSCOPY (EGD) (N/A) Principal Problem:   Dislocation of hip joint prosthesis Active Problems:   Postoperative anemia due to acute blood loss   Unspecified essential hypertension   GERD (gastroesophageal reflux disease)   Hypopotassemia   Aspiration pneumonia   Nonspecific (abnormal) findings on radiological and other examination of gastrointestinal tract   Other dysphagia  Estimated body mass index is 20.36 kg/(m^2) as calculated from the following:   Height as of this encounter: 5' (1.524 m).   Weight as of this encounter: 47.287 kg (104 lb 4 oz). Up with therapy Discharge to SNF when medically stable.  DVT Prophylaxis - Aspirin Weight Bearing As Tolerated right Leg [facility] as per Medicine Service  Loretta Garcia 01/27/2014, 7:24 AM

## 2014-01-27 NOTE — Progress Notes (Signed)
Physical Therapy Treatment Patient Details Name: Loretta Garcia MRN: [id number] DOB: Nov 21, 1919 Today's Date: 01/27/2014    History of Present Illness 80 yo female s/p R hip acetabular revision with constrained liner 4/13. Hx of HTN, osteoporosis, dementia, anxiety, TIA, L THA, multiple dislocations/surgeries R hip. Pt is from [facility]    PT Comments    Pt feeling weak and lightheaded (per chart did not sleep well), SpO2 assessed (see Pertinent Vitals below).  Pt agreeable to ambulation in hallway with one standing rest break and bed level exercises.  Pt able to recall 2/3 posterior hip precautions, precautions reinforced with explanation, demonstration, and a hand out.  Likely to discharge to SNF.  Follow Up Recommendations  SNF;Supervision/Assistance - 24 hour     Equipment Recommendations  None recommended by PT    Recommendations for Other Services       Precautions / Restrictions Precautions Precautions: Posterior Hip;Fall Precaution Comments: Able to recall 2/3 precautions at the beginning and end of treatment, precautions reinforced verbally, demonstrated, and provided handout Restrictions Weight Bearing Restrictions: Yes RLE Weight Bearing: Weight bearing as tolerated    Mobility  Bed Mobility Overal bed mobility: Needs Assistance Bed Mobility: Supine to Sit;Sit to Supine     Supine to sit: Min guard Sit to supine: Min assist   General bed mobility comments: verbal cues for safety and maintenance of precautions; min assist to manage R LE into supine  Transfers Overall transfer level: Needs assistance Equipment used: Rolling walker (2 wheeled) Transfers: Sit to/from Stand Sit to Stand: Min guard         General transfer comment: verbal cues for safety and sequence; pt reported feeling weak and lightheaded, required increased guarding; SpO2 94% on room air  Ambulation/Gait Ambulation/Gait assistance: Min guard Ambulation Distance (Feet): 180  Feet Assistive device: Rolling walker (2 wheeled) Gait Pattern/deviations: Step-through pattern;Decreased stride length;Trunk flexed;Antalgic Gait velocity: decr   General Gait Details: verbal cuing for safety and posture; pt feeling weak and tired (per chart, did not sleep well last night), standing break after 90 feet   Stairs            Wheelchair Mobility    Modified Rankin (Stroke Patients Only)       Balance                                    Cognition Arousal/Alertness: Awake/alert Behavior During Therapy: WFL for tasks assessed/performed   Area of Impairment: Memory     Memory: Decreased short-term memory              Exercises Total Joint Exercises Ankle Circles/Pumps: AROM;Both;20 reps;Supine Quad Sets: AROM;Right;10 reps;Supine Gluteal Sets: AROM;Right;10 reps;Supine Heel Slides: AAROM;Right;10 reps;Supine Hip ABduction/ADduction: AROM;Right;10 reps;Supine    General Comments        Pertinent Vitals/Pain SpO2 prior to ambulation, room air: 94% SpO2 following ambulation, room air: 95% Activity to tolerance.  Pain limited to incision site.    Home Living                      Prior Function            PT Goals (current goals can now be found in the care plan section) Progress towards PT goals: Progressing toward goals    Frequency  Min 5X/week    PT Plan Current plan remains appropriate    Co-evaluation  End of Session Equipment Utilized During Treatment: Gait belt Activity Tolerance: Patient tolerated treatment well Patient left: in bed;with call bell/phone within reach     Time: 1005-1031 PT Time Calculation (min): 26 min  Charges:  $Gait Training: 8-22 mins $Therapeutic Exercise: 8-22 mins                    G CodesBufford Garcia:      Loretta Garcia 01/27/2014, 12:17 PM Loretta LopeLaura Annmarie Garcia SPT 01/27/2014

## 2014-01-27 NOTE — Progress Notes (Signed)
I have reviewed this note and agree with all findings. Kati Corneshia Hines, PT, DPT Pager: [phone]   

## 2014-01-27 NOTE — Progress Notes (Signed)
[facility] PROGRESS NOTE  OMOLOLA MITTMAN Garcia:[id number] DOB: [birth year] DOA: 01/20/2014 PCP: [name], MD  Assessment/Plan: Principal Problem:   Dislocation of hip joint prosthesis - Orthopedic surgery on board and managing.  - Patient is status post right hip acetabular revision - Cleared medically for discharge to SNF  Active Problems:   Postoperative anemia due to acute blood loss - Stable with no active bleeding and hemoglobin improved from 8.7 to 9.5    Unspecified essential hypertension - Relatively well controlled we'll continue to monitor blood pressures remain elevated will consider placing on antihypertensive regimen.    GERD (gastroesophageal reflux disease) - Patient can continue home regimen once discharged.    Aspiration pneumonia - Secondary to dysphasia. - Patient is status post dilation - Continue broad-spectrum antibiotic - Will narrow antibiotic regimen and place on levaquin may continue for 2 more days after discharge    Other dysphagia - After endoscopy currently GI presuming Candida esophagitis and possible pill esophagitis  - Diflucan was started and esophageal irritants like iron and potassium chloride were discontinued - Diflucan per GI recommendations.  Hypokalemia - most likely due to poor oral intake - improving and suspect continued improvement with improvement in oral intake.  - GI has recommended holding off on oral K supplementation given esophagitis.  Code Status: full Family Communication: No family at bedside Disposition Plan: Ok to discharge to SNF for continued physical therapy.  Consultants:  Ortho  GI  Procedures:  EGD right hip acetabular revision  Antibiotics:  Cefepime and Vancomycin>>>4/19  Levaquin  HPI/Subjective: No new complaints, no acute issues reported overnight.  Objective: Filed Vitals:   01/27/14 0430  BP: 119/66  Pulse: 87  Temp: 99.3 F (37.4 C)  Resp: 20    Intake/Output  Summary (Last 24 hours) at 01/27/14 1010 Last data filed at 01/27/14 8119  Gross per 24 hour  Intake    746 ml  Output    250 ml  Net    496 ml   Filed Weights   01/20/14 1244  Weight: 47.287 kg (104 lb 4 oz)    Exam:   General:  Pt in NAD, alert and awake  Cardiovascular: RRR, no MRG  Respiratory: No increased work of breathing, breath sounds bilaterally, no wheezes  Abdomen: Soft, nondistended, positive bowel sounds  Musculoskeletal: No cyanosis or clubbing   Data Reviewed: Basic Metabolic Panel:  Recent Labs Lab 01/22/14 0425 01/23/14 0205 01/25/14 1036 01/26/14 0438 01/27/14 0445  NA 134* 138 139 135* 137  K 3.8 3.8 2.8* 3.6* 3.6*  CL 98 100 102 98 99  CO2 28 27 27 26 25   GLUCOSE 143* 150* 128* 114* 102*  BUN 16 14 6  5* 8  CREATININE 0.58 0.55 0.52 0.47* 0.57  CALCIUM 8.8 8.5 7.7* 8.0* 8.0*  MG  --   --  1.4*  --   --   PHOS  --   --  1.4*  --   --    Liver Function Tests:  Recent Labs Lab 01/20/14 1315 01/25/14 1036  AST 20 23  ALT 13 17  ALKPHOS 62 46  BILITOT 0.3 0.5  PROT 6.1 5.2*  ALBUMIN 2.9* 2.0*   No results found for this basename: LIPASE, AMYLASE,  in the last 168 hours No results found for this basename: AMMONIA,  in the last 168 hours CBC:  Recent Labs Lab 01/21/14 0430 01/22/14 0425 01/23/14 0205 01/25/14 1036 01/27/14 0445  WBC 8.5 10.9* 10.1 8.4 9.1  HGB  10.8* 9.3* 9.7* 8.7* 9.5*  HCT 31.0* 26.9* 28.4* 25.4* 27.8*  MCV 91.2 90.3 91.3 91.7 90.0  PLT 248 221 233 261 270   Cardiac Enzymes: No results found for this basename: CKTOTAL, CKMB, CKMBINDEX, TROPONINI,  in the last 168 hours BNP (last 3 results) No results found for this basename: PROBNP,  in the last 8760 hours CBG: No results found for this basename: GLUCAP,  in the last 168 hours  Recent Results (from the past 240 hour(s))  SURGICAL PCR SCREEN     Status: None   Collection Time    01/20/14 12:56 PM      Result Value Ref Range Status   MRSA, PCR  NEGATIVE  NEGATIVE Final   Staphylococcus aureus NEGATIVE  NEGATIVE Final   Comment:            The Xpert SA Assay (FDA     approved for NASAL specimens     in patients over 78 years of age),     is one component of     a comprehensive surveillance     program.  Test performance has     been validated by [facility] for patients greater     than or equal to 78 year old.     It is not intended     to diagnose infection nor to     guide or monitor treatment.  CULTURE, BLOOD (ROUTINE X 2)     Status: None   Collection Time    01/23/14  2:00 AM      Result Value Ref Range Status   Specimen Description BLOOD LEFT ANTECUBITAL   Final   Special Requests BOTTLES DRAWN AEROBIC ONLY 4CC   Final   Culture  Setup Time     Final   Value: 01/23/2014 08:56     Performed at [facility]   Culture     Final   Value:        BLOOD CULTURE RECEIVED NO GROWTH TO DATE CULTURE WILL BE HELD FOR 5 DAYS BEFORE ISSUING A FINAL NEGATIVE REPORT     Performed at [facility]   Report Status PENDING   Incomplete  CULTURE, BLOOD (ROUTINE X 2)     Status: None   Collection Time    01/23/14  2:05 AM      Result Value Ref Range Status   Specimen Description BLOOD LEFTHAND   Final   Special Requests BOTTLES DRAWN AEROBIC ONLY 3CC   Final   Culture  Setup Time     Final   Value: 01/23/2014 08:56     Performed at [facility]   Culture     Final   Value:        BLOOD CULTURE RECEIVED NO GROWTH TO DATE CULTURE WILL BE HELD FOR 5 DAYS BEFORE ISSUING A FINAL NEGATIVE REPORT     Performed at [facility]   Report Status PENDING   Incomplete     Studies: No results found.  Scheduled Meds: . amLODipine  2.5 mg Oral Daily  . aspirin EC  325 mg Oral Q breakfast  . docusate sodium  100 mg Oral BID  . fluconazole  100 mg Oral Daily  . indapamide  1.25 mg Oral q morning - 10a  . levofloxacin  750 mg Oral Q48H  . ofloxacin  1 drop Right Eye BID  . pantoprazole  40 mg Oral  Daily  . polyethylene  glycol  17 g Oral QODAY  . sertraline  50 mg Oral q morning - 10a   Continuous Infusions:    Time spent: > 35 minutes   Penny Piarlando Lisabeth Mian  [facility] Pager [phone]. If 7PM-7AM, please contact night-coverage at [email or web address], password [email or web address] 01/27/2014, 10:10 AM  LOS: 7 days

## 2014-01-27 NOTE — Discharge Instructions (Signed)
Dr. Gaynelle Arabian Total Joint Specialist [facility] [address], [place], [place] [address] [phone]   TOTAL HIP REPLACEMENT POSTOPERATIVE DIRECTIONS    Hip Rehabilitation, Guidelines Following Surgery  The results of a hip operation are greatly improved after range of motion and muscle strengthening exercises. Follow all safety measures which are given to protect your hip. If any of these exercises cause increased pain or swelling in your joint, decrease the amount until you are comfortable again. Then slowly increase the exercises. Call your caregiver if you have problems or questions.  HOME CARE INSTRUCTIONS  Most of the following instructions are designed to prevent the dislocation of your new hip.  Remove items at home which could result in a fall. This includes throw rugs or furniture in walking pathways.  Continue medications as instructed at time of discharge.  You may have some home medications which will be placed on hold until you complete the course of blood thinner medication.  You may start showering once you are discharged home but do not submerge the incision under water. Just pat the incision dry and apply a dry gauze dressing on daily. Do not put on socks or shoes without following the instructions of your caregivers.  Sit on high chairs so your hips are not bent more than 90 degrees.  Sit on chairs with arms. Use the chair arms to help push yourself up when arising.  Keep your leg on the side of the operation out in front of you when standing up.  Arrange for the use of a toilet seat elevator so you are not sitting low.  Do not do any exercises or get in any positions that cause your toes to point in (pigeon toed).  Always sleep with a pillow between your legs. Do not lie on your side in sleep with both knees touching the bed.   Walk with walker as instructed.  You may resume a sexual relationship in one month or when given the OK by  your caregiver.  Use walker as long as suggested by your caregivers.  You may put full weight on your legs and walk as much as is comfortable. Avoid periods of inactivity such as sitting longer than an hour when not asleep. This helps prevent blood clots.  You may return to work once you are cleared by [employer], [employer].  Do not drive a car for 6 weeks or until released by your surgeon.  Do not drive while taking narcotics.  Wear elastic stockings for three weeks following surgery during the day but you may remove then at night.  Make sure you keep all of your appointments after your operation with all of your doctors and caregivers. You should call the office at the above phone number and make an appointment for approximately two weeks after the date of your surgery. Change the dressing daily and reapply a dry dressing each time. Please pick up a stool softener and laxative for home use as long as you are requiring pain medications.  Continue to use ice on the hip for pain and swelling from surgery. You may notice swelling that will progress down to the foot and ankle.  This is normal after  surgery.  Elevate the leg when you are not up walking on it.   It is important for you to complete the blood thinner medication as prescribed by your doctor.  Continue to use the breathing machine which will help keep your temperature down.  It  is common for your temperature to cycle up and down following surgery, especially at night when you are not up moving around and exerting yourself.  The breathing machine keeps your lungs expanded and your temperature down.  RANGE OF MOTION AND STRENGTHENING EXERCISES  These exercises are designed to help you keep full movement of your hip joint. Follow your caregiver's or physical therapist's instructions. Perform all exercises about fifteen times, three times per day or as directed. Exercise both hips, even if you have had only one joint replacement. These exercises can be  done on a training (exercise) mat, on the floor, on a table or on a bed. Use whatever works the best and is most comfortable for you. Use music or television while you are exercising so that the exercises are a pleasant break in your day. This will make your life better with the exercises acting as a break in routine you can look forward to.  Lying on your back, slowly slide your foot toward your buttocks, raising your knee up off the floor. Then slowly slide your foot back down until your leg is straight again.  Lying on your back spread your legs as far apart as you can without causing discomfort.  Lying on your side, raise your upper leg and foot straight up from the floor as far as is comfortable. Slowly lower the leg and repeat.  Lying on your back, tighten up the muscle in the front of your thigh (quadriceps muscles). You can do this by keeping your leg straight and trying to raise your heel off the floor. This helps strengthen the largest muscle supporting your knee.  Lying on your back, tighten up the muscles of your buttocks both with the legs straight and with the knee bent at a comfortable angle while keeping your heel on the floor.   SKILLED REHAB INSTRUCTIONS: If the patient is transferred to a skilled rehab facility following release from the hospital, a list of the current medications will be sent to the facility for the patient to continue.  When discharged from the skilled rehab facility, please have the facility set up the patient's Home Health Physical Therapy prior to being released. Also, the skilled facility will be responsible for providing the patient with their medications at time of release from the facility to include their pain medication, the muscle relaxants, and their blood thinner medication. If the patient is still at the rehab facility at time of the two week follow up appointment, the skilled rehab facility will also need to assist the patient in arranging follow up  appointment in our office and any transportation needs.  MAKE SURE YOU:  Understand these instructions.  Will watch your condition.  Will get help right away if you are not doing well or get worse.  Pick up stool softner and laxative for home. Do not submerge incision under water. May shower. Continue to use ice for pain and swelling from surgery. Hip precautions.  Total Hip Protocol.  Take full dose Aspirin for 2 and a half more weeks, then reduce to and resume baby 81 mg Aspirin.

## 2014-01-27 NOTE — Progress Notes (Signed)
Pt c/o insomnia throughout night yet was unwilling to take any pain meds etc to facilitate sleep. Stated she takes "Melatonin" at home which works well.

## 2014-01-28 DIAGNOSIS — J189 Pneumonia, unspecified organism: Secondary | ICD-10-CM | POA: Diagnosis not present

## 2014-01-28 DIAGNOSIS — K209 Esophagitis, unspecified without bleeding: Secondary | ICD-10-CM | POA: Diagnosis not present

## 2014-01-28 DIAGNOSIS — D649 Anemia, unspecified: Secondary | ICD-10-CM | POA: Diagnosis not present

## 2014-01-29 LAB — CULTURE, BLOOD (ROUTINE X 2)
Culture: NO GROWTH
Culture: NO GROWTH

## 2014-02-12 DIAGNOSIS — M6281 Muscle weakness (generalized): Secondary | ICD-10-CM | POA: Diagnosis not present

## 2014-02-12 DIAGNOSIS — S73006A Unspecified dislocation of unspecified hip, initial encounter: Secondary | ICD-10-CM | POA: Diagnosis not present

## 2014-02-12 DIAGNOSIS — M159 Polyosteoarthritis, unspecified: Secondary | ICD-10-CM | POA: Diagnosis not present

## 2014-02-12 DIAGNOSIS — M810 Age-related osteoporosis without current pathological fracture: Secondary | ICD-10-CM | POA: Diagnosis not present

## 2014-02-13 DIAGNOSIS — M6281 Muscle weakness (generalized): Secondary | ICD-10-CM | POA: Diagnosis not present

## 2014-02-13 DIAGNOSIS — S73006A Unspecified dislocation of unspecified hip, initial encounter: Secondary | ICD-10-CM | POA: Diagnosis not present

## 2014-02-13 DIAGNOSIS — M159 Polyosteoarthritis, unspecified: Secondary | ICD-10-CM | POA: Diagnosis not present

## 2014-02-13 DIAGNOSIS — M810 Age-related osteoporosis without current pathological fracture: Secondary | ICD-10-CM | POA: Diagnosis not present

## 2014-02-18 DIAGNOSIS — M6281 Muscle weakness (generalized): Secondary | ICD-10-CM | POA: Diagnosis not present

## 2014-02-18 DIAGNOSIS — M159 Polyosteoarthritis, unspecified: Secondary | ICD-10-CM | POA: Diagnosis not present

## 2014-02-18 DIAGNOSIS — M810 Age-related osteoporosis without current pathological fracture: Secondary | ICD-10-CM | POA: Diagnosis not present

## 2014-02-18 DIAGNOSIS — S73006A Unspecified dislocation of unspecified hip, initial encounter: Secondary | ICD-10-CM | POA: Diagnosis not present

## 2014-02-21 DIAGNOSIS — M159 Polyosteoarthritis, unspecified: Secondary | ICD-10-CM | POA: Diagnosis not present

## 2014-02-21 DIAGNOSIS — M810 Age-related osteoporosis without current pathological fracture: Secondary | ICD-10-CM | POA: Diagnosis not present

## 2014-02-21 DIAGNOSIS — S73006A Unspecified dislocation of unspecified hip, initial encounter: Secondary | ICD-10-CM | POA: Diagnosis not present

## 2014-02-21 DIAGNOSIS — M6281 Muscle weakness (generalized): Secondary | ICD-10-CM | POA: Diagnosis not present

## 2014-02-24 DIAGNOSIS — M159 Polyosteoarthritis, unspecified: Secondary | ICD-10-CM | POA: Diagnosis not present

## 2014-02-24 DIAGNOSIS — M810 Age-related osteoporosis without current pathological fracture: Secondary | ICD-10-CM | POA: Diagnosis not present

## 2014-02-24 DIAGNOSIS — S73006A Unspecified dislocation of unspecified hip, initial encounter: Secondary | ICD-10-CM | POA: Diagnosis not present

## 2014-02-24 DIAGNOSIS — M6281 Muscle weakness (generalized): Secondary | ICD-10-CM | POA: Diagnosis not present

## 2014-02-26 DIAGNOSIS — S73006A Unspecified dislocation of unspecified hip, initial encounter: Secondary | ICD-10-CM | POA: Diagnosis not present

## 2014-02-26 DIAGNOSIS — M6281 Muscle weakness (generalized): Secondary | ICD-10-CM | POA: Diagnosis not present

## 2014-02-26 DIAGNOSIS — M159 Polyosteoarthritis, unspecified: Secondary | ICD-10-CM | POA: Diagnosis not present

## 2014-02-26 DIAGNOSIS — M810 Age-related osteoporosis without current pathological fracture: Secondary | ICD-10-CM | POA: Diagnosis not present

## 2014-03-06 DIAGNOSIS — Z9660 Presence of unspecified orthopedic joint implant: Secondary | ICD-10-CM | POA: Diagnosis not present

## 2014-05-22 DIAGNOSIS — Z1331 Encounter for screening for depression: Secondary | ICD-10-CM | POA: Diagnosis not present

## 2014-05-22 DIAGNOSIS — Z79899 Other long term (current) drug therapy: Secondary | ICD-10-CM | POA: Diagnosis not present

## 2014-05-22 DIAGNOSIS — Z0000 Encounter for general adult medical examination without abnormal findings: Secondary | ICD-10-CM | POA: Diagnosis not present

## 2014-05-22 DIAGNOSIS — E782 Mixed hyperlipidemia: Secondary | ICD-10-CM | POA: Diagnosis not present

## 2014-05-22 DIAGNOSIS — I10 Essential (primary) hypertension: Secondary | ICD-10-CM | POA: Diagnosis not present

## 2014-05-22 DIAGNOSIS — Z23 Encounter for immunization: Secondary | ICD-10-CM | POA: Diagnosis not present

## 2014-05-22 DIAGNOSIS — R300 Dysuria: Secondary | ICD-10-CM | POA: Diagnosis not present

## 2014-05-29 DIAGNOSIS — H1589 Other disorders of sclera: Secondary | ICD-10-CM | POA: Diagnosis not present

## 2014-05-29 DIAGNOSIS — H33009 Unspecified retinal detachment with retinal break, unspecified eye: Secondary | ICD-10-CM | POA: Diagnosis not present

## 2014-06-27 DIAGNOSIS — L578 Other skin changes due to chronic exposure to nonionizing radiation: Secondary | ICD-10-CM | POA: Diagnosis not present

## 2014-06-27 DIAGNOSIS — L820 Inflamed seborrheic keratosis: Secondary | ICD-10-CM | POA: Diagnosis not present

## 2014-09-30 DIAGNOSIS — H5989 Other postprocedural complications and disorders of eye and adnexa, not elsewhere classified: Secondary | ICD-10-CM | POA: Diagnosis not present

## 2014-09-30 DIAGNOSIS — H43813 Vitreous degeneration, bilateral: Secondary | ICD-10-CM | POA: Diagnosis not present

## 2014-10-22 DIAGNOSIS — Z961 Presence of intraocular lens: Secondary | ICD-10-CM | POA: Diagnosis not present

## 2014-11-20 DIAGNOSIS — N390 Urinary tract infection, site not specified: Secondary | ICD-10-CM | POA: Diagnosis not present

## 2014-11-20 DIAGNOSIS — I10 Essential (primary) hypertension: Secondary | ICD-10-CM | POA: Diagnosis not present

## 2015-01-17 IMAGING — CT CT ANGIO CHEST
2 of 6 series · 19 of 36 positions shown · IV contrast (OMNIPAQUE)
Comparison: 07/06/2012

CLINICAL DATA: Short of breath.  Confusion.

EXAM:
CT ANGIOGRAPHY CHEST WITH CONTRAST
TECHNIQUE: Multidetector CT imaging of the chest was performed using the
standard protocol during bolus administration of intravenous
contrast. Multiplanar CT image reconstructions and MIPs were
obtained to evaluate the vascular anatomy.
CONTRAST:  100mL OMNIPAQUE IOHEXOL 350 MG/ML SOLN

[Series 6: pe thins @ 1mm · axial · 0.62mm/px · z∈[-470,-212]mm · 18 of 289 slices shown]
[im 15/289  lung]
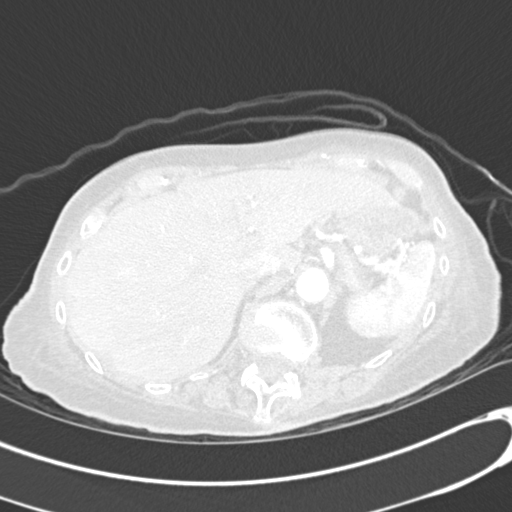
[im 29/289  mediastinal]
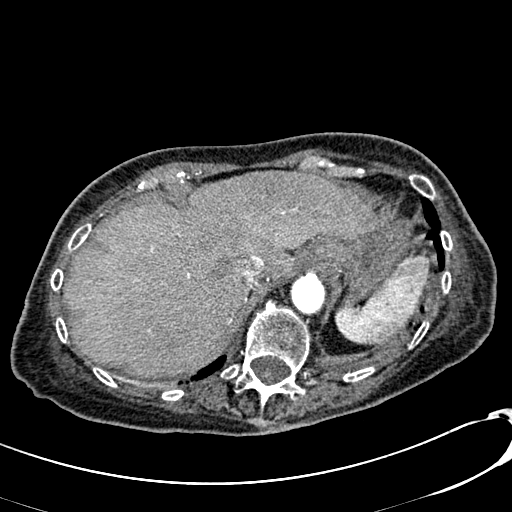
[im 44/289  lung]
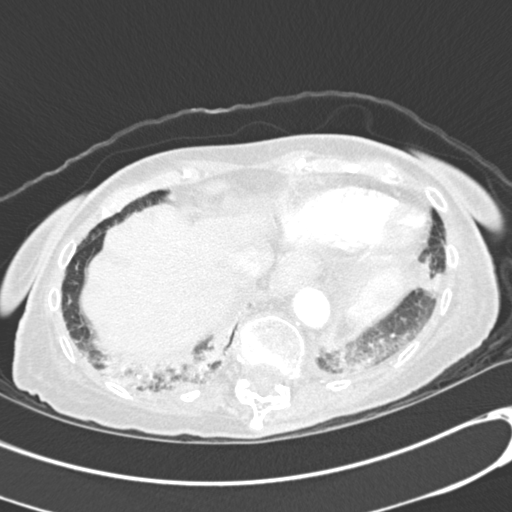
[im 58/289  mediastinal]
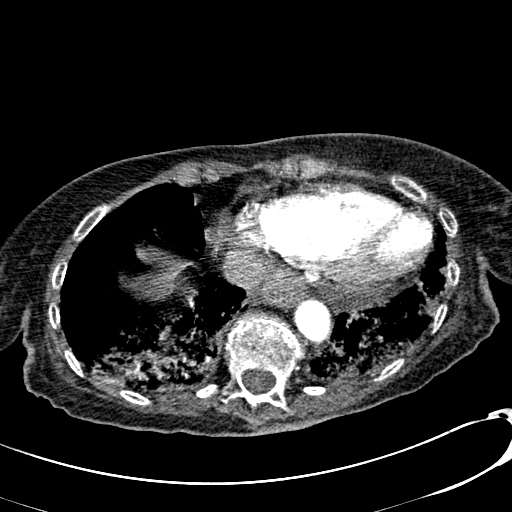
[im 73/289  lung]
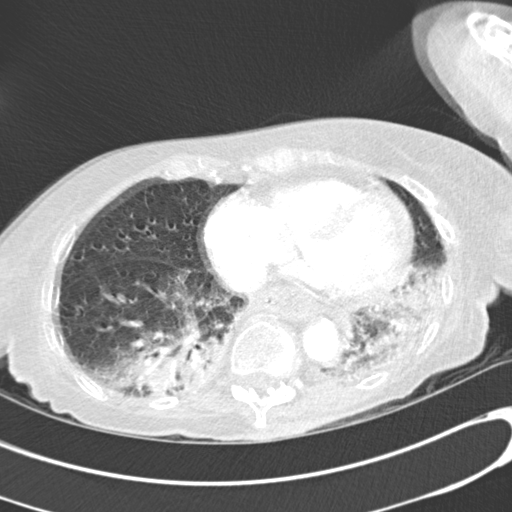
[im 87/289  mediastinal]
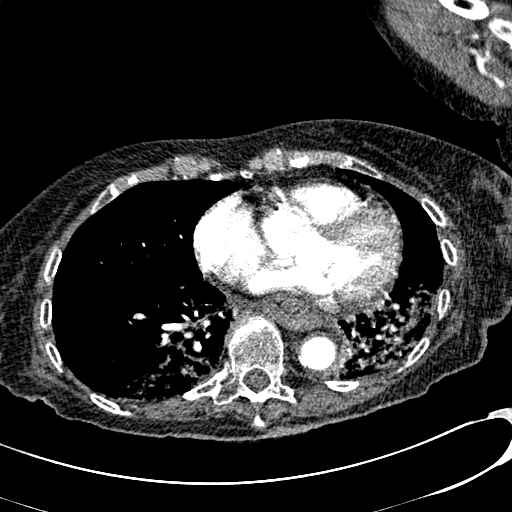
[im 101/289  lung]
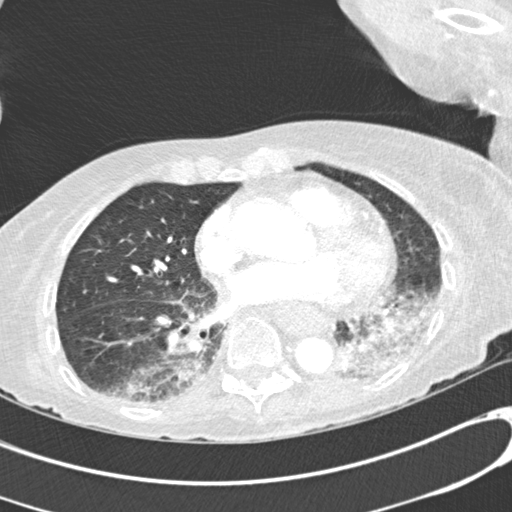
[im 116/289  mediastinal]
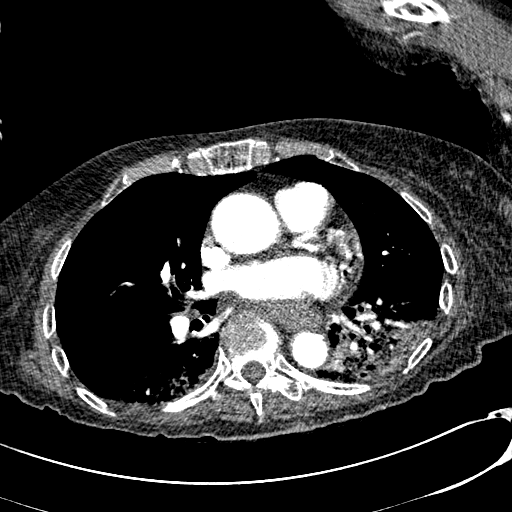
[im 130/289  lung]
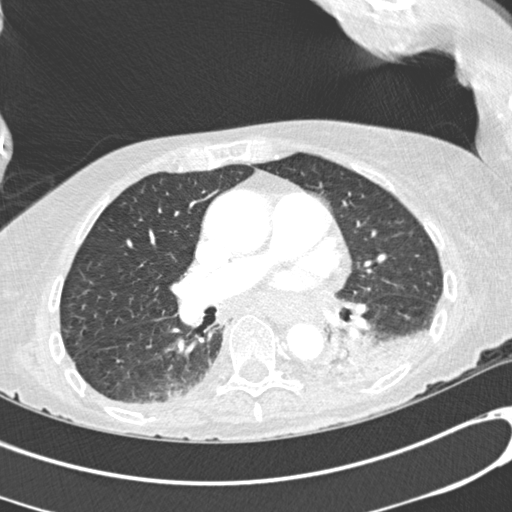
[im 159/289  mediastinal]
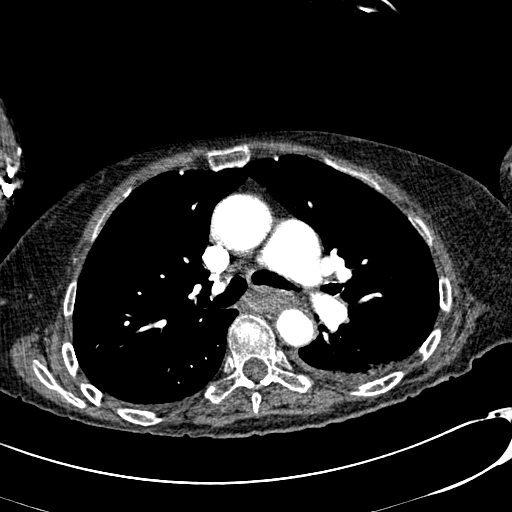
[im 173/289  lung]
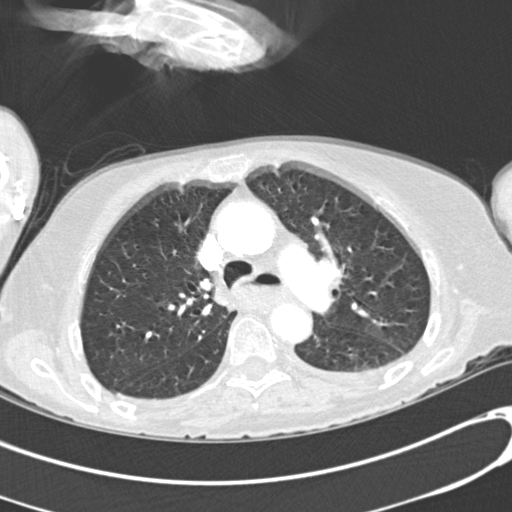
[im 188/289  mediastinal]
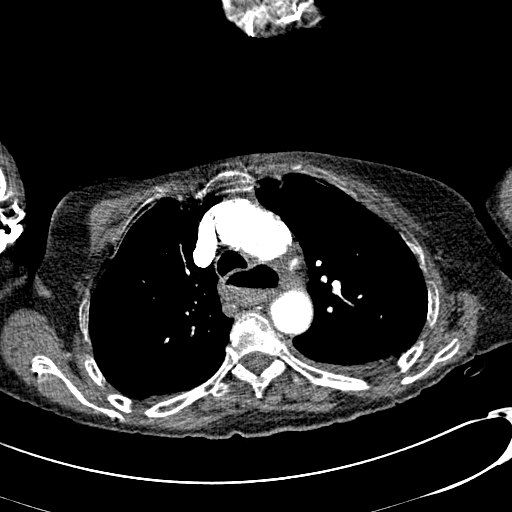
[im 202/289  lung]
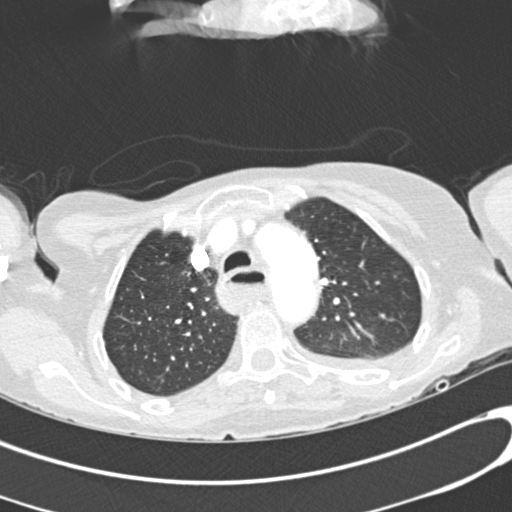
[im 217/289  mediastinal]
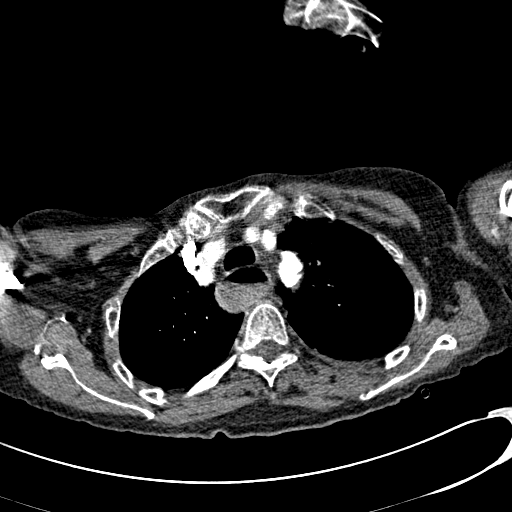
[im 231/289  lung]
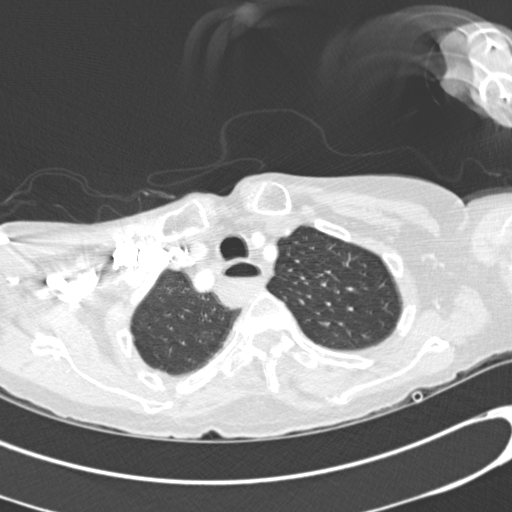
[im 245/289  mediastinal]
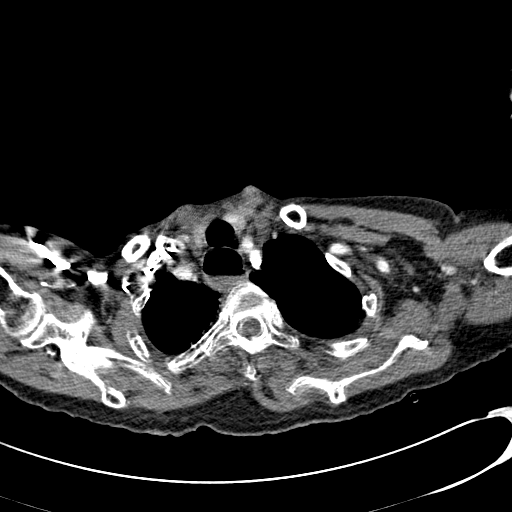
[im 260/289  lung]
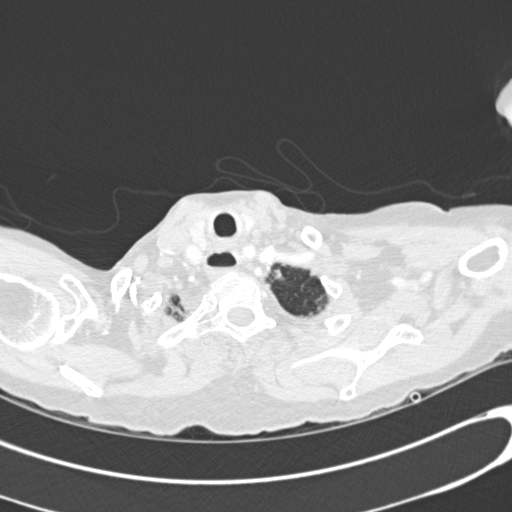
[im 274/289  mediastinal]
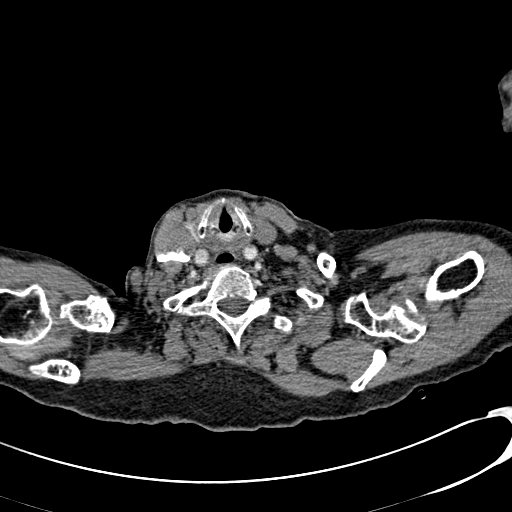

[Series 602: cor · coronal · 0.62mm/px · 1 of 89 slices shown]
[im 45/89  mediastinal]
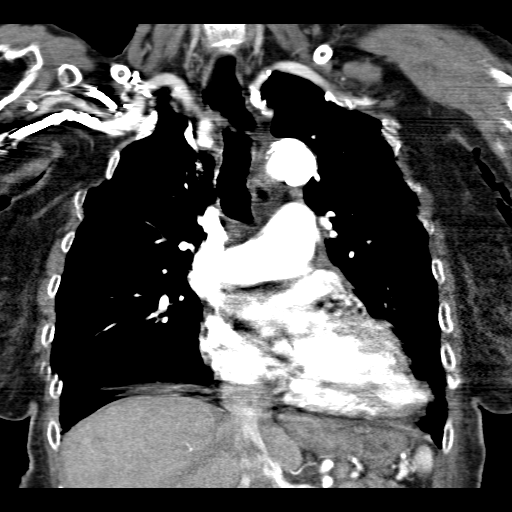

[19 of 36 positions shown; findings below may reference images not displayed]

FINDINGS: Pulmonary arterial opacification is good. There are no pulmonary
emboli. There is atherosclerosis of the aorta but no aneurysm or
dissection.

There is bilateral dependent lower lobe pneumonia suggesting
aspiration. There is a small amount of pleural fluid on the left.
The esophagus is markedly dilated and fluid-filled.

No mediastinal mass or adenopathy. The non involved portions of the
lung are clear and normal.

There is a compression fracture at T12 which is old. No acute bony
finding.

Review of the MIP images confirms the above findings.
IMPRESSION: No pulmonary emboli.

Bilateral dependent lower lobe bronchopneumonia, probably due to
aspiration. Dilated, fluid-filled esophagus.

## 2015-03-12 DIAGNOSIS — H5989 Other postprocedural complications and disorders of eye and adnexa, not elsewhere classified: Secondary | ICD-10-CM | POA: Diagnosis not present

## 2015-03-28 ENCOUNTER — Inpatient Hospital Stay (HOSPITAL_COMMUNITY): Payer: Medicare Other

## 2015-03-28 ENCOUNTER — Emergency Department (HOSPITAL_COMMUNITY): Payer: Medicare Other

## 2015-03-28 ENCOUNTER — Encounter (HOSPITAL_COMMUNITY): Payer: Self-pay | Admitting: Oncology

## 2015-03-28 ENCOUNTER — Inpatient Hospital Stay (HOSPITAL_COMMUNITY)
Admission: EM | Admit: 2015-03-28 | Discharge: 2015-03-31 | DRG: 184 | Disposition: A | Discharge status: Skilled Nursing Facility | Payer: Medicare Other | Attending: General Surgery | Admitting: General Surgery

## 2015-03-28 DIAGNOSIS — G8929 Other chronic pain: Secondary | ICD-10-CM | POA: Diagnosis present

## 2015-03-28 DIAGNOSIS — F0280 Dementia in other diseases classified elsewhere without behavioral disturbance: Secondary | ICD-10-CM | POA: Diagnosis present

## 2015-03-28 DIAGNOSIS — Y92091 Bathroom in other non-institutional residence as the place of occurrence of the external cause: Secondary | ICD-10-CM

## 2015-03-28 DIAGNOSIS — J9811 Atelectasis: Secondary | ICD-10-CM | POA: Diagnosis present

## 2015-03-28 DIAGNOSIS — S3690XA Unspecified injury of unspecified intra-abdominal organ, initial encounter: Secondary | ICD-10-CM | POA: Diagnosis not present

## 2015-03-28 DIAGNOSIS — Z79891 Long term (current) use of opiate analgesic: Secondary | ICD-10-CM

## 2015-03-28 DIAGNOSIS — Z6821 Body mass index (BMI) 21.0-21.9, adult: Secondary | ICD-10-CM | POA: Diagnosis not present

## 2015-03-28 DIAGNOSIS — D519 Vitamin B12 deficiency anemia, unspecified: Secondary | ICD-10-CM | POA: Diagnosis not present

## 2015-03-28 DIAGNOSIS — Z888 Allergy status to other drugs, medicaments and biological substances status: Secondary | ICD-10-CM | POA: Diagnosis not present

## 2015-03-28 DIAGNOSIS — R54 Age-related physical debility: Secondary | ICD-10-CM | POA: Diagnosis present

## 2015-03-28 DIAGNOSIS — W1800XA Striking against unspecified object with subsequent fall, initial encounter: Secondary | ICD-10-CM | POA: Diagnosis present

## 2015-03-28 DIAGNOSIS — M62838 Other muscle spasm: Secondary | ICD-10-CM | POA: Diagnosis not present

## 2015-03-28 DIAGNOSIS — F419 Anxiety disorder, unspecified: Secondary | ICD-10-CM | POA: Diagnosis not present

## 2015-03-28 DIAGNOSIS — J690 Pneumonitis due to inhalation of food and vomit: Secondary | ICD-10-CM | POA: Diagnosis not present

## 2015-03-28 DIAGNOSIS — Z7982 Long term (current) use of aspirin: Secondary | ICD-10-CM

## 2015-03-28 DIAGNOSIS — E46 Unspecified protein-calorie malnutrition: Secondary | ICD-10-CM | POA: Diagnosis present

## 2015-03-28 DIAGNOSIS — K219 Gastro-esophageal reflux disease without esophagitis: Secondary | ICD-10-CM | POA: Diagnosis present

## 2015-03-28 DIAGNOSIS — M1990 Unspecified osteoarthritis, unspecified site: Secondary | ICD-10-CM | POA: Diagnosis present

## 2015-03-28 DIAGNOSIS — S2249XA Multiple fractures of ribs, unspecified side, initial encounter for closed fracture: Secondary | ICD-10-CM | POA: Diagnosis not present

## 2015-03-28 DIAGNOSIS — F0150 Vascular dementia without behavioral disturbance: Secondary | ICD-10-CM | POA: Diagnosis not present

## 2015-03-28 DIAGNOSIS — S2231XA Fracture of one rib, right side, initial encounter for closed fracture: Secondary | ICD-10-CM

## 2015-03-28 DIAGNOSIS — R2689 Other abnormalities of gait and mobility: Secondary | ICD-10-CM | POA: Diagnosis not present

## 2015-03-28 DIAGNOSIS — R0602 Shortness of breath: Secondary | ICD-10-CM | POA: Diagnosis not present

## 2015-03-28 DIAGNOSIS — E876 Hypokalemia: Secondary | ICD-10-CM | POA: Diagnosis not present

## 2015-03-28 DIAGNOSIS — Y92009 Unspecified place in unspecified non-institutional (private) residence as the place of occurrence of the external cause: Secondary | ICD-10-CM

## 2015-03-28 DIAGNOSIS — Z96643 Presence of artificial hip joint, bilateral: Secondary | ICD-10-CM | POA: Diagnosis present

## 2015-03-28 DIAGNOSIS — S2241XD Multiple fractures of ribs, right side, subsequent encounter for fracture with routine healing: Secondary | ICD-10-CM | POA: Diagnosis not present

## 2015-03-28 DIAGNOSIS — M4312 Spondylolisthesis, cervical region: Secondary | ICD-10-CM | POA: Diagnosis present

## 2015-03-28 DIAGNOSIS — R278 Other lack of coordination: Secondary | ICD-10-CM | POA: Diagnosis not present

## 2015-03-28 DIAGNOSIS — H33001 Unspecified retinal detachment with retinal break, right eye: Secondary | ICD-10-CM | POA: Diagnosis not present

## 2015-03-28 DIAGNOSIS — R296 Repeated falls: Secondary | ICD-10-CM | POA: Diagnosis present

## 2015-03-28 DIAGNOSIS — Z79899 Other long term (current) drug therapy: Secondary | ICD-10-CM

## 2015-03-28 DIAGNOSIS — S2249XD Multiple fractures of ribs, unspecified side, subsequent encounter for fracture with routine healing: Secondary | ICD-10-CM | POA: Diagnosis not present

## 2015-03-28 DIAGNOSIS — S2241XA Multiple fractures of ribs, right side, initial encounter for closed fracture: Secondary | ICD-10-CM | POA: Diagnosis not present

## 2015-03-28 DIAGNOSIS — Z23 Encounter for immunization: Secondary | ICD-10-CM | POA: Diagnosis not present

## 2015-03-28 DIAGNOSIS — R488 Other symbolic dysfunctions: Secondary | ICD-10-CM | POA: Diagnosis not present

## 2015-03-28 DIAGNOSIS — M810 Age-related osteoporosis without current pathological fracture: Secondary | ICD-10-CM | POA: Diagnosis present

## 2015-03-28 DIAGNOSIS — Z8673 Personal history of transient ischemic attack (TIA), and cerebral infarction without residual deficits: Secondary | ICD-10-CM | POA: Diagnosis not present

## 2015-03-28 DIAGNOSIS — Z885 Allergy status to narcotic agent status: Secondary | ICD-10-CM

## 2015-03-28 DIAGNOSIS — N390 Urinary tract infection, site not specified: Secondary | ICD-10-CM | POA: Diagnosis not present

## 2015-03-28 DIAGNOSIS — R32 Unspecified urinary incontinence: Secondary | ICD-10-CM | POA: Diagnosis present

## 2015-03-28 DIAGNOSIS — Z880 Allergy status to penicillin: Secondary | ICD-10-CM

## 2015-03-28 DIAGNOSIS — K5900 Constipation, unspecified: Secondary | ICD-10-CM | POA: Diagnosis not present

## 2015-03-28 DIAGNOSIS — R079 Chest pain, unspecified: Secondary | ICD-10-CM | POA: Diagnosis not present

## 2015-03-28 DIAGNOSIS — S2239XA Fracture of one rib, unspecified side, initial encounter for closed fracture: Secondary | ICD-10-CM | POA: Diagnosis not present

## 2015-03-28 DIAGNOSIS — I10 Essential (primary) hypertension: Secondary | ICD-10-CM | POA: Diagnosis present

## 2015-03-28 DIAGNOSIS — F329 Major depressive disorder, single episode, unspecified: Secondary | ICD-10-CM | POA: Diagnosis not present

## 2015-03-28 DIAGNOSIS — W19XXXD Unspecified fall, subsequent encounter: Secondary | ICD-10-CM | POA: Diagnosis not present

## 2015-03-28 DIAGNOSIS — M6281 Muscle weakness (generalized): Secondary | ICD-10-CM | POA: Diagnosis not present

## 2015-03-28 DIAGNOSIS — S199XXA Unspecified injury of neck, initial encounter: Secondary | ICD-10-CM | POA: Diagnosis not present

## 2015-03-28 DIAGNOSIS — Z9181 History of falling: Secondary | ICD-10-CM | POA: Diagnosis not present

## 2015-03-28 DIAGNOSIS — W19XXXA Unspecified fall, initial encounter: Secondary | ICD-10-CM

## 2015-03-28 DIAGNOSIS — G309 Alzheimer's disease, unspecified: Secondary | ICD-10-CM | POA: Diagnosis present

## 2015-03-28 DIAGNOSIS — S2249XS Multiple fractures of ribs, unspecified side, sequela: Secondary | ICD-10-CM | POA: Diagnosis not present

## 2015-03-28 DIAGNOSIS — S3991XA Unspecified injury of abdomen, initial encounter: Secondary | ICD-10-CM | POA: Diagnosis not present

## 2015-03-28 DIAGNOSIS — M150 Primary generalized (osteo)arthritis: Secondary | ICD-10-CM | POA: Diagnosis not present

## 2015-03-28 DIAGNOSIS — S0990XA Unspecified injury of head, initial encounter: Secondary | ICD-10-CM | POA: Diagnosis not present

## 2015-03-28 DIAGNOSIS — G4700 Insomnia, unspecified: Secondary | ICD-10-CM | POA: Diagnosis not present

## 2015-03-28 LAB — URINALYSIS, ROUTINE W REFLEX MICROSCOPIC
BILIRUBIN URINE: NEGATIVE
Glucose, UA: NEGATIVE mg/dL
Hgb urine dipstick: NEGATIVE
KETONES UR: NEGATIVE mg/dL
Leukocytes, UA: NEGATIVE
Nitrite: NEGATIVE
PROTEIN: NEGATIVE mg/dL
Specific Gravity, Urine: 1.011 (ref 1.005–1.030)
Urobilinogen, UA: 0.2 mg/dL (ref 0.0–1.0)
pH: 7.5 (ref 5.0–8.0)

## 2015-03-28 LAB — CBC WITH DIFFERENTIAL/PLATELET
BASOS PCT: 0 % (ref 0–1)
Basophils Absolute: 0 10*3/uL (ref 0.0–0.1)
EOS ABS: 0 10*3/uL (ref 0.0–0.7)
Eosinophils Relative: 0 % (ref 0–5)
HEMATOCRIT: 37.9 % (ref 36.0–46.0)
Hemoglobin: 12.5 g/dL (ref 12.0–15.0)
Lymphocytes Relative: 12 % (ref 12–46)
Lymphs Abs: 1.1 10*3/uL (ref 0.7–4.0)
MCH: 31.6 pg (ref 26.0–34.0)
MCHC: 33 g/dL (ref 30.0–36.0)
MCV: 95.7 fL (ref 78.0–100.0)
MONO ABS: 0.5 10*3/uL (ref 0.1–1.0)
Monocytes Relative: 6 % (ref 3–12)
NEUTROS ABS: 7.8 10*3/uL — AB (ref 1.7–7.7)
Neutrophils Relative %: 82 % — ABNORMAL HIGH (ref 43–77)
Platelets: 219 10*3/uL (ref 150–400)
RBC: 3.96 MIL/uL (ref 3.87–5.11)
RDW: 13 % (ref 11.5–15.5)
WBC: 9.5 10*3/uL (ref 4.0–10.5)

## 2015-03-28 LAB — BASIC METABOLIC PANEL
ANION GAP: 8 (ref 5–15)
BUN: 18 mg/dL (ref 6–20)
CALCIUM: 8.8 mg/dL — AB (ref 8.9–10.3)
CO2: 28 mmol/L (ref 22–32)
Chloride: 102 mmol/L (ref 101–111)
Creatinine, Ser: 0.67 mg/dL (ref 0.44–1.00)
GFR calc Af Amer: >60 mL/min (ref >60)
GFR calc non Af Amer: >60 mL/min (ref >60)
GLUCOSE: 109 mg/dL — AB (ref 65–99)
Potassium: 3.3 mmol/L — ABNORMAL LOW (ref 3.5–5.1)
Sodium: 138 mmol/L (ref 135–145)

## 2015-03-28 LAB — I-STAT TROPONIN, ED: Troponin i, poc: 0.02 ng/mL (ref 0.00–0.08)

## 2015-03-28 LAB — MRSA PCR SCREENING: MRSA by PCR: NEGATIVE

## 2015-03-28 MED ORDER — PNEUMOCOCCAL VAC POLYVALENT 25 MCG/0.5ML IJ INJ
0.5000 mL | INJECTION | INTRAMUSCULAR | Status: AC
  Administered 2015-03-29: 0.5 mL via INTRAMUSCULAR
  Filled 2015-03-28: qty 0.5

## 2015-03-28 MED ORDER — METHOCARBAMOL 1000 MG/10ML IJ SOLN
500.0000 mg | Freq: Four times a day (QID) | INTRAVENOUS | Status: DC
  Administered 2015-03-28 – 2015-03-30 (×7): 500 mg via INTRAVENOUS
  Filled 2015-03-28 (×12): qty 5

## 2015-03-28 MED ORDER — MORPHINE SULFATE 4 MG/ML IJ SOLN
4.0000 mg | Freq: Once | INTRAMUSCULAR | Status: AC
  Administered 2015-03-28: 4 mg via INTRAVENOUS
  Filled 2015-03-28: qty 1

## 2015-03-28 MED ORDER — IPRATROPIUM-ALBUTEROL 0.5-2.5 (3) MG/3ML IN SOLN
3.0000 mL | Freq: Four times a day (QID) | RESPIRATORY_TRACT | Status: DC
  Administered 2015-03-28: 3 mL via RESPIRATORY_TRACT
  Filled 2015-03-28: qty 3

## 2015-03-28 MED ORDER — DIPHENHYDRAMINE HCL 50 MG/ML IJ SOLN: 12.5000 mg | Freq: Four times a day (QID) | INTRAMUSCULAR | Status: DC | PRN

## 2015-03-28 MED ORDER — DOCUSATE SODIUM 100 MG PO CAPS
100.0000 mg | ORAL_CAPSULE | Freq: Two times a day (BID) | ORAL | Status: DC
  Administered 2015-03-28 – 2015-03-31 (×7): 100 mg via ORAL
  Filled 2015-03-28 (×6): qty 1

## 2015-03-28 MED ORDER — ACETAMINOPHEN 325 MG PO TABS: 650.0000 mg | ORAL_TABLET | Freq: Four times a day (QID) | ORAL | Status: DC | PRN

## 2015-03-28 MED ORDER — IOHEXOL 300 MG/ML  SOLN
100.0000 mL | Freq: Once | INTRAMUSCULAR | Status: AC | PRN
  Administered 2015-03-28: 100 mL via INTRAVENOUS

## 2015-03-28 MED ORDER — HEPARIN SODIUM (PORCINE) 5000 UNIT/ML IJ SOLN
5000.0000 [IU] | Freq: Three times a day (TID) | INTRAMUSCULAR | Status: DC
  Administered 2015-03-28 – 2015-03-30 (×5): 5000 [IU] via SUBCUTANEOUS
  Filled 2015-03-28 (×14): qty 1

## 2015-03-28 MED ORDER — IPRATROPIUM-ALBUTEROL 0.5-2.5 (3) MG/3ML IN SOLN: 3.0000 mL | RESPIRATORY_TRACT | Status: DC | PRN

## 2015-03-28 MED ORDER — ENSURE ENLIVE PO LIQD
237.0000 mL | Freq: Two times a day (BID) | ORAL | Status: DC
  Administered 2015-03-28 – 2015-03-30 (×5): 237 mL via ORAL

## 2015-03-28 MED ORDER — POTASSIUM CHLORIDE IN NACL 20-0.9 MEQ/L-% IV SOLN
INTRAVENOUS | Status: DC
  Administered 2015-03-28 – 2015-03-31 (×3): via INTRAVENOUS
  Filled 2015-03-28 (×5): qty 1000

## 2015-03-28 MED ORDER — SENNA 8.6 MG PO TABS
1.0000 | ORAL_TABLET | Freq: Every evening | ORAL | Status: DC | PRN
  Filled 2015-03-28: qty 1

## 2015-03-28 MED ORDER — OXYCODONE HCL 5 MG PO TABS
5.0000 mg | ORAL_TABLET | ORAL | Status: DC | PRN
  Administered 2015-03-28 – 2015-03-30 (×6): 10 mg via ORAL
  Filled 2015-03-28: qty 2
  Filled 2015-03-28: qty 1
  Filled 2015-03-28 (×3): qty 2
  Filled 2015-03-28: qty 1
  Filled 2015-03-28: qty 2

## 2015-03-28 MED ORDER — ALPRAZOLAM 0.25 MG PO TABS: 0.2500 mg | ORAL_TABLET | Freq: Three times a day (TID) | ORAL | Status: DC | PRN

## 2015-03-28 MED ORDER — IPRATROPIUM-ALBUTEROL 0.5-2.5 (3) MG/3ML IN SOLN
3.0000 mL | Freq: Three times a day (TID) | RESPIRATORY_TRACT | Status: DC
  Administered 2015-03-28 – 2015-03-29 (×3): 3 mL via RESPIRATORY_TRACT
  Filled 2015-03-28 (×4): qty 3

## 2015-03-28 MED ORDER — ACETAMINOPHEN 650 MG RE SUPP: 650.0000 mg | Freq: Four times a day (QID) | RECTAL | Status: DC | PRN

## 2015-03-28 MED ORDER — PANTOPRAZOLE SODIUM 40 MG PO TBEC
40.0000 mg | DELAYED_RELEASE_TABLET | Freq: Every day | ORAL | Status: DC
  Administered 2015-03-28 – 2015-03-31 (×4): 40 mg via ORAL
  Filled 2015-03-28 (×4): qty 1

## 2015-03-28 MED ORDER — AMLODIPINE BESYLATE 2.5 MG PO TABS
2.5000 mg | ORAL_TABLET | Freq: Every day | ORAL | Status: DC
  Administered 2015-03-28 – 2015-03-31 (×3): 2.5 mg via ORAL
  Filled 2015-03-28 (×4): qty 1

## 2015-03-28 MED ORDER — METOCLOPRAMIDE HCL 10 MG PO TABS
5.0000 mg | ORAL_TABLET | Freq: Three times a day (TID) | ORAL | Status: DC | PRN
  Filled 2015-03-28: qty 1

## 2015-03-28 MED ORDER — POLYETHYLENE GLYCOL 3350 17 G PO PACK
17.0000 g | PACK | ORAL | Status: DC
  Filled 2015-03-28 (×2): qty 1

## 2015-03-28 MED ORDER — MORPHINE SULFATE 2 MG/ML IJ SOLN
1.0000 mg | INTRAMUSCULAR | Status: DC | PRN
  Administered 2015-03-28 – 2015-03-29 (×4): 2 mg via INTRAVENOUS
  Filled 2015-03-28 (×4): qty 1

## 2015-03-28 MED ORDER — ONDANSETRON HCL 4 MG/2ML IJ SOLN
4.0000 mg | Freq: Four times a day (QID) | INTRAMUSCULAR | Status: DC | PRN
  Administered 2015-03-28: 4 mg via INTRAVENOUS
  Filled 2015-03-28: qty 2

## 2015-03-28 MED ORDER — DIPHENHYDRAMINE HCL 12.5 MG/5ML PO ELIX
12.5000 mg | ORAL_SOLUTION | Freq: Four times a day (QID) | ORAL | Status: DC | PRN
  Filled 2015-03-28: qty 5

## 2015-03-28 NOTE — ED Notes (Signed)
Pt given lunch

## 2015-03-28 NOTE — ED Provider Notes (Signed)
Medical screening examination/treatment/procedure(s) were conducted as a shared visit with non-physician practitioner(s) and myself.  I personally evaluated the patient during the encounter.   EKG Interpretation   Date/Time:  Saturday March 28 2015 06:46:00 EDT Ventricular Rate:  64 PR Interval:  288 QRS Duration: 75 QT Interval:  446 QTC Calculation: 460 R Axis:   -53 Text Interpretation:  Sinus or ectopic atrial rhythm Prolonged PR interval  Left anterior fascicular block Anteroseptal infarct, age indeterminate  Confirmed by [facility]  MD, TREY ([id number]) on 03/28/2015 11:14:08 PM      81 yo female presenting after a fall while at home.  She is not sure why she fell.  She struck her right chest wall, which is causing her severe pain.  On exam, well appearing, nontoxic, not distressed, normal respiratory effort, normal perfusion, alert, clear lung sounds but mild splinting, right chest wall TTP.  Found to have multiple rib fractures.  Plan admit.   Clinical Impression: 1. Multiple rib fractures, right, closed, initial encounter   2. Fall   3. Rib fractures, right, closed, initial encounter       Blake Divine, MD 03/28/15 2314

## 2015-03-28 NOTE — ED Notes (Signed)
Per EMS pt got OOB to go to bathroom when she fell and hit her head and chest.  Denies LOC.  Pt is rating pain 7/10 in right rib cage.  Pt is A&O x 4.  Pt is from [facility] [place] [facility].

## 2015-03-28 NOTE — ED Notes (Signed)
Pt's daughter Loretta Garcia [phone] called stating family friend Zollie Beckers "Gwynneth Munson" is coming to take pt's jewelry and take it back to pt's place of residence. Pt has approved this.

## 2015-03-28 NOTE — H&P (Signed)
Loretta Garcia is an 81 y.o. female.   PCP:  DR. HAL STONEKING  Chief Complaint: [place] going to bathroom, het her head and chest with 7/10 right rib pain HPI: 81 y/o with medical history as listed below, got up to bathroom and fell in bathroom hitting her head and chest.  She denied LOC. She says she just fell, I talked to one daughter who says her legs give out some times.She cannot remember when this AM this occurred.  It took her a minute to recognize her voice on the phone.    Work up shows her VSS.  K+ a little low but otherwise normal CT head:  No evidence of traumatic intracranial injury or fracture. CT spine:  No evidence of acute fracture or subluxation along the cervical spine.  Moderate cortical volume loss and diffuse small vessel ischemic microangiopathy.  Chronic small infarcts at the cerebellar hemispheres bilaterally, and chronic ischemic change at the inferior aspects of the frontal lobes bilaterally.  Mildly worsened chronic grade 2 anterolisthesis of C3 on C4, reflecting underlying facet disease, with mild diffuse degenerative change along the mid to lower cervical spine. CT chest, abdomen and pelvis shows:  Mildly displaced fractures of the right third through sixth and eighth anterolateral ribs, with mild comminution of several fractures and significant displacement at the sixth rib.  No additional evidence for traumatic injury to the chest, abdomen or pelvis.  Bilateral subsegmental atelectasis noted. Lungs otherwise clear. Chronic compression deformity T11.   Past Medical History  Diagnosis Date  . Hypertension   . Osteoporosis   . GERD (gastroesophageal reflux disease)   . Potassium (K) deficiency   . UTI (lower urinary tract infection)     LAST UTI MARCH 2015 ?  [facility] Stroke     TIA - 20 SOME YRS AGO - STILL HAS TINGLING LEFT LEG - NO OTHER RESIDUALS  . Anxiety   . Dementia     STILL ABLE TO SIGN OWN LEGAL PAPERS  . Arthritis     C/O OF PAIN IN MIDDLE BACK - PAST HX  OF FRACTURE / OSTEOPOROSIS  . Recurrent falls     USES [facility]ALK[facility] - LIVING INDEPENDENTLY IN APARTMENT AT [facility] WITH HIP DISLOCATION- IS CURRENTLY AT [facility]  [phone]    Past Surgical History  Procedure Laterality Date  . Joint replacement      RIGHT HIP REPLACEMENT AND REVISIONS; LEFT TOTAL HIP REPLACEMENT AND REVISION.  [facility] surgery      SURGERY FOR DETACHED RETINA  . Total hip revision Right 01/20/2014    Procedure: RIGHT HIP ACETABULAR REVISION ;  Surgeon: Gearlean Alf, MD;  Location: [facility];  Service: Orthopedics;  Laterality: Right;  . Esophagogastroduodenoscopy N/A 01/24/2014    Procedure: ESOPHAGOGASTRODUODENOSCOPY (EGD);  Surgeon: Ladene Artist, MD;  Location: [facility];  Service: Endoscopy;  Laterality: N/A;    History reviewed. No pertinent family history. Social History:  reports that she has never smoked. She has never used smokeless tobacco. She reports that she does not drink alcohol or use illicit drugs.  Allergies:  Allergies  Allergen Reactions  . Altace [Ramipril]     Per MAR  . Calcitonin     Per MAR  . Cardura [Doxazosin Mesylate]     Per MAR  . Fosamax [Alendronate Sodium]     Per MAR  . Penicillins     Per MAR  . Tramadol     Per MAR  .  Codeine Other (See Comments)    unknown  . Darvocet [Propoxyphene N-Acetaminophen] Other (See Comments)    unknown  . Plavix [Clopidogrel Bisulfate] Other (See Comments)    unsure   She cannot recall which of these meds she is actually taking. Prior to Admission medications   Medication Sig Start Date End Date Taking? Authorizing Provider  acetaminophen (TYLENOL) 500 MG tablet Take 1,000 mg by mouth 3 (three) times daily.   Yes Historical Provider, MD  ALPRAZolam (XANAX) 0.25 MG tablet Take 1 tablet (0.25 mg total) by mouth 3 (three) times daily as needed for anxiety. 01/27/14  Yes Arlee Muslim, PA-C  amLODipine (NORVASC) 2.5 MG tablet Take 1 tablet (2.5 mg total) by  mouth daily. 01/27/14  Yes Arlee Muslim, PA-C  aspirin EC 325 MG EC tablet Take 1 tablet (325 mg total) by mouth daily with breakfast. Take Aspirin 314m for two and a half more weeks, then discontinue Full Dose Aspirin. Once the patient has completed the Full Dose 325 mg Aspirin, they may resume the 81 mg Aspirin. 01/27/14  Yes DArlee Muslim PA-C  bisacodyl (DULCOLAX) 10 MG suppository Place 1 suppository (10 mg total) rectally daily as needed for moderate constipation. 01/27/14  Yes DArlee Muslim PA-C  docusate sodium 100 MG CAPS Take 100 mg by mouth 2 (two) times daily. 01/27/14  Yes DArlee Muslim PA-C  HYDROmorphone (DILAUDID) 2 MG tablet Take 1 tablet (2 mg total) by mouth every 4 (four) hours as needed for severe pain. 01/27/14  Yes DArlee Muslim PA-C  indapamide (LOZOL) 1.25 MG tablet Take 1.25 mg by mouth every morning.   Yes Historical Provider, MD  levofloxacin (LEVAQUIN) 750 MG tablet Take 1 tablet (750 mg total) by mouth every other day. Take for two more doses and then discontinue Levaquin. 01/27/14  Yes DArlee Muslim PA-C  Melatonin 5 MG TABS Take 5 mg by mouth at bedtime as needed (Sleep).   Yes Historical Provider, MD  methocarbamol (ROBAXIN) 500 MG tablet Take 1 tablet (500 mg total) by mouth every 6 (six) hours as needed for muscle spasms. 01/27/14  Yes DArlee Muslim PA-C  metoCLOPramide (REGLAN) 5 MG tablet Take 1-2 tablets (5-10 mg total) by mouth every 8 (eight) hours as needed for nausea (if ondansetron (ZOFRAN) ineffective.). 01/27/14  Yes DArlee Muslim PA-C  Multiple Vitamin (MULTIVITAMIN) tablet Take 1 tablet by mouth daily.   Yes Historical Provider, MD  ofloxacin (OCUFLOX) 0.3 % ophthalmic solution Place 1 drop into the right eye 2 (two) times daily.   Yes Historical Provider, MD  ondansetron (ZOFRAN) 4 MG tablet Take 1 tablet (4 mg total) by mouth every 6 (six) hours as needed for nausea. 01/27/14  Yes DArlee Muslim PA-C  pantoprazole (PROTONIX) 40 MG tablet Take 1 tablet (40 mg  total) by mouth daily. 01/27/14  Yes DArlee Muslim PA-C  polyethylene glycol (MIRALAX / GLYCOLAX) packet Take 17 g by mouth every other day.   Yes Historical Provider, MD  sertraline (ZOLOFT) 50 MG tablet Take 50 mg by mouth every morning.   Yes Historical Provider, MD  vitamin B-12 (CYANOCOBALAMIN) 100 MCG tablet Take 100 mcg by mouth daily.   Yes Historical Provider, MD     Results for orders placed or performed during the hospital encounter of 03/28/15 (from the past 48 hour(s))  Urinalysis, Routine w reflex microscopic (not [facility]     Status: Abnormal   Collection Time: 03/28/15  6:49 AM  Result Value Ref Range   Color, Urine YELLOW  YELLOW   APPearance CLOUDY (A) CLEAR   Specific Gravity, Urine 1.011 1.005 - 1.030   pH 7.5 5.0 - 8.0   Glucose, UA NEGATIVE NEGATIVE mg/dL   Hgb urine dipstick NEGATIVE NEGATIVE   Bilirubin Urine NEGATIVE NEGATIVE   Ketones, ur NEGATIVE NEGATIVE mg/dL   Protein, ur NEGATIVE NEGATIVE mg/dL   Urobilinogen, UA 0.2 0.0 - 1.0 mg/dL   Nitrite NEGATIVE NEGATIVE   Leukocytes, UA NEGATIVE NEGATIVE    Comment: MICROSCOPIC NOT DONE ON URINES WITH NEGATIVE PROTEIN, BLOOD, LEUKOCYTES, NITRITE, OR GLUCOSE <1000 mg/dL.  CBC with Differential     Status: Abnormal   Collection Time: 03/28/15  7:04 AM  Result Value Ref Range   WBC 9.5 4.0 - 10.5 K/uL   RBC 3.96 3.87 - 5.11 MIL/uL   Hemoglobin 12.5 12.0 - 15.0 g/dL   HCT 37.9 36.0 - 46.0 %   MCV 95.7 78.0 - 100.0 fL   MCH 31.6 26.0 - 34.0 pg   MCHC 33.0 30.0 - 36.0 g/dL   RDW 13.0 11.5 - 15.5 %   Platelets 219 150 - 400 K/uL   Neutrophils Relative % 82 (H) 43 - 77 %   Neutro Abs 7.8 (H) 1.7 - 7.7 K/uL   Lymphocytes Relative 12 12 - 46 %   Lymphs Abs 1.1 0.7 - 4.0 K/uL   Monocytes Relative 6 3 - 12 %   Monocytes Absolute 0.5 0.1 - 1.0 K/uL   Eosinophils Relative 0 0 - 5 %   Eosinophils Absolute 0.0 0.0 - 0.7 K/uL   Basophils Relative 0 0 - 1 %   Basophils Absolute 0.0 0.0 - 0.1 K/uL  Basic metabolic panel      Status: Abnormal   Collection Time: 03/28/15  7:04 AM  Result Value Ref Range   Sodium 138 135 - 145 mmol/L   Potassium 3.3 (L) 3.5 - 5.1 mmol/L   Chloride 102 101 - 111 mmol/L   CO2 28 22 - 32 mmol/L   Glucose, Bld 109 (H) 65 - 99 mg/dL   BUN 18 6 - 20 mg/dL   Creatinine, Ser 0.67 0.44 - 1.00 mg/dL   Calcium 8.8 (L) 8.9 - 10.3 mg/dL   GFR calc non Af Amer >60 >60 mL/min   GFR calc Af Amer >60 >60 mL/min    Comment: (NOTE) The eGFR has been calculated using the CKD EPI equation. This calculation has not been validated in all clinical situations. eGFR's persistently <60 mL/min signify possible Chronic Kidney Disease.    Anion gap 8 5 - 15  I-stat troponin, ED     Status: None   Collection Time: 03/28/15  7:08 AM  Result Value Ref Range   Troponin i, poc 0.02 0.00 - 0.08 ng/mL   Comment 3            Comment: Due to the release kinetics of cTnI, a negative result within the first hours of the onset of symptoms does not rule out myocardial infarction with certainty. If myocardial infarction is still suspected, repeat the test at appropriate intervals.    Dg Ribs Unilateral W/chest Right  03/28/2015   CLINICAL DATA:  Acute right rib pain after falling getting out of bed today. Initial encounter.  EXAM: RIGHT RIBS AND CHEST - 3+ VIEW  COMPARISON:  December 12, 2013.  FINDINGS: Moderately displaced fractures are seen involving the right sixth, seventh and eighth ribs. No pneumothorax or significant pleural effusion is noted. Stable cardiomediastinal silhouette. Atherosclerotic calcifications of thoracic  aortic arch are noted. No acute pulmonary disease is noted.  IMPRESSION: Moderately displaced fractures of the right sixth, seventh and eighth ribs. No pneumothorax or significant pleural effusion is noted.   Electronically Signed   By: Marijo Conception, M.D.   On: 03/28/2015 07:38   Ct Head Wo Contrast  03/28/2015   CLINICAL DATA:  Status post fall while going to bathroom. Concern for  head or cervical spine injury. Initial encounter.  EXAM: CT HEAD WITHOUT CONTRAST  CT CERVICAL SPINE WITHOUT CONTRAST  TECHNIQUE: Multidetector CT imaging of the head and cervical spine was performed following the standard protocol without intravenous contrast. Multiplanar CT image reconstructions of the cervical spine were also generated.  COMPARISON:  CT of the head performed 01/07/2014, CT of the cervical spine performed 10/02/2009, and MRI/MRA of the brain performed 11/05/2003  FINDINGS: CT HEAD FINDINGS  There is no evidence of acute infarction, mass lesion, or intra- or extra-axial hemorrhage on CT.  Diffuse periventricular and subcortical white matter change likely reflects small vessel ischemic microangiopathy. Moderate cerebellar atrophy is noted. Small chronic infarcts are noted at the cerebellar hemispheres bilaterally. There is chronic ischemic change at the inferior aspects of the frontal lobes bilaterally.  The brainstem and fourth ventricle are within normal limits. No mass effect or midline shift is seen.  There is no evidence of fracture; visualized osseous structures are unremarkable in appearance. Postoperative change is noted at the right orbit. The orbits are otherwise within normal limits. The paranasal sinuses and mastoid air cells are well-aerated. No significant soft tissue abnormalities are seen.  CT CERVICAL SPINE FINDINGS  There is no evidence of acute fracture or subluxation. There is mildly worsening chronic grade 2 retrolisthesis of C3 on C4, reflecting underlying facet disease, with multilevel disc space narrowing along the mid to lower cervical spine, and grade 1 anterolisthesis of C7 on T1. Degenerative change is noted about the dens. Vertebral bodies demonstrate normal height Prevertebral soft tissues are within normal limits.  The visualized portions of the thyroid gland are unremarkable in appearance. Mild scarring is noted at the lung apices. Scattered calcification is noted at  the carotid bifurcations bilaterally.  IMPRESSION: 1. No evidence of traumatic intracranial injury or fracture. 2. No evidence of acute fracture or subluxation along the cervical spine. 3. Moderate cortical volume loss and diffuse small vessel ischemic microangiopathy. 4. Chronic small infarcts at the cerebellar hemispheres bilaterally, and chronic ischemic change at the inferior aspects of the frontal lobes bilaterally. 5. Mildly worsened chronic grade 2 anterolisthesis of C3 on C4, reflecting underlying facet disease, with mild diffuse degenerative change along the mid to lower cervical spine. 6. Mild scarring at the lung apices. 7. Scattered calcification at the carotid bifurcations bilaterally. Carotid ultrasound could be considered for further evaluation, when and as deemed clinically appropriate.   Electronically Signed   By: Garald Balding M.D.   On: 03/28/2015 07:26   Ct Chest W Contrast  03/28/2015   CLINICAL DATA:  Status post fall, with right-sided rib fractures. Concern for chest or abdominal injury. Initial encounter.  EXAM: CT CHEST, ABDOMEN, AND PELVIS WITH CONTRAST  TECHNIQUE: Multidetector CT imaging of the chest, abdomen and pelvis was performed following the standard protocol during bolus administration of intravenous contrast.  CONTRAST:  17m OMNIPAQUE IOHEXOL 300 MG/ML  SOLN  COMPARISON:  Chest radiograph performed earlier today at 7:22 a.m., and CTA of the chest performed 01/22/2014. CT of the abdomen and pelvis performed 04/25/2005  FINDINGS: CT CHEST  FINDINGS  Bilateral subsegmental atelectasis is noted. The lungs are otherwise clear. No significant focal consolidation, pleural effusion or pneumothorax is seen. No masses are identified. There is no evidence of pulmonary parenchymal contusion.  Leftward mediastinal shift likely reflects left-sided volume loss. No mediastinal lymphadenopathy is seen. No pericardial effusions identified. Scattered calcification is noted along the aortic  arch and proximal great vessels. The thyroid gland is unremarkable. No axillary lymphadenopathy is seen.  There is no evidence of significant soft tissue injury along the chest wall.  There are mildly displaced fractures of the right third through sixth and eighth anterolateral ribs, with mild comminution of several fractures and significant displacement at the sixth rib. The irregular appearance of the inferior sternum is stable from prior studies and appears to reflect remote traumatic injury.  CT ABDOMEN AND PELVIS FINDINGS  No free air or free fluid is seen within the abdomen or pelvis. There is no evidence of solid or hollow organ injury.  There is a 1.5 cm cystic focus at the body of the pancreas, of uncertain significance. An underlying cystic mass cannot be excluded. The remainder of the pancreas is unremarkable.  The liver and spleen are unremarkable in appearance. The gallbladder is within normal limits. The adrenal glands are unremarkable.  The kidneys are unremarkable in appearance. There is no evidence of hydronephrosis. No renal or ureteral stones are seen. No perinephric stranding is appreciated.  The small bowel is unremarkable in appearance. The stomach is within normal limits. No acute vascular abnormalities are seen. The abdominal aorta is somewhat tortuous. There is diffuse calcification along the abdominal aorta and its branches.  The appendix is normal in caliber, without evidence of appendicitis. Scattered diverticulosis is noted along the sigmoid colon, without evidence of diverticulitis.  The bladder is moderately distended and grossly unremarkable in appearance. The uterus is grossly unremarkable. The ovaries are relatively symmetric. No suspicious adnexal masses are seen. No inguinal lymphadenopathy is seen.  No acute osseous abnormalities are identified. The patient's bilateral hip total arthroplasties are grossly unremarkable in appearance, with screws extending outside of the bone on  the right side. One of the screws appears to nearly abut the right external iliac artery, though this appearance is relatively stable from 2006. Chronic deformities are noted at the superior and inferior pubic rami bilaterally.  There is a chronic compression deformity involving vertebral body T11, with associated degenerative change. Multilevel vacuum phenomenon is noted along the lower thoracic and lumbar spine.  IMPRESSION: 1. Mildly displaced fractures of the right third through sixth and eighth anterolateral ribs, with mild comminution of several fractures and significant displacement at the sixth rib. 2. No additional evidence for traumatic injury to the chest, abdomen or pelvis. 3. 1.5 cm cystic focus noted at the body of the pancreas. MRCP would be helpful for further evaluation, to exclude malignancy. 4. Diffuse calcification along the abdominal aorta and its branches. 5. Bilateral subsegmental atelectasis noted.  Lungs otherwise clear. 6. Scattered diverticulosis along the sigmoid colon, without evidence of diverticulitis. 7. Chronic compression deformity at T11, with associated degenerative change. Multilevel vacuum phenomenon along the lower thoracic and lumbar spine.   Electronically Signed   By: Garald Balding M.D.   On: 03/28/2015 09:07   Ct Cervical Spine Wo Contrast  03/28/2015   CLINICAL DATA:  Status post fall while going to bathroom. Concern for head or cervical spine injury. Initial encounter.  EXAM: CT HEAD WITHOUT CONTRAST  CT CERVICAL SPINE WITHOUT CONTRAST  TECHNIQUE: Multidetector  CT imaging of the head and cervical spine was performed following the standard protocol without intravenous contrast. Multiplanar CT image reconstructions of the cervical spine were also generated.  COMPARISON:  CT of the head performed 01/07/2014, CT of the cervical spine performed 10/02/2009, and MRI/MRA of the brain performed 11/05/2003  FINDINGS: CT HEAD FINDINGS  There is no evidence of acute infarction,  mass lesion, or intra- or extra-axial hemorrhage on CT.  Diffuse periventricular and subcortical white matter change likely reflects small vessel ischemic microangiopathy. Moderate cerebellar atrophy is noted. Small chronic infarcts are noted at the cerebellar hemispheres bilaterally. There is chronic ischemic change at the inferior aspects of the frontal lobes bilaterally.  The brainstem and fourth ventricle are within normal limits. No mass effect or midline shift is seen.  There is no evidence of fracture; visualized osseous structures are unremarkable in appearance. Postoperative change is noted at the right orbit. The orbits are otherwise within normal limits. The paranasal sinuses and mastoid air cells are well-aerated. No significant soft tissue abnormalities are seen.  CT CERVICAL SPINE FINDINGS  There is no evidence of acute fracture or subluxation. There is mildly worsening chronic grade 2 retrolisthesis of C3 on C4, reflecting underlying facet disease, with multilevel disc space narrowing along the mid to lower cervical spine, and grade 1 anterolisthesis of C7 on T1. Degenerative change is noted about the dens. Vertebral bodies demonstrate normal height Prevertebral soft tissues are within normal limits.  The visualized portions of the thyroid gland are unremarkable in appearance. Mild scarring is noted at the lung apices. Scattered calcification is noted at the carotid bifurcations bilaterally.  IMPRESSION: 1. No evidence of traumatic intracranial injury or fracture. 2. No evidence of acute fracture or subluxation along the cervical spine. 3. Moderate cortical volume loss and diffuse small vessel ischemic microangiopathy. 4. Chronic small infarcts at the cerebellar hemispheres bilaterally, and chronic ischemic change at the inferior aspects of the frontal lobes bilaterally. 5. Mildly worsened chronic grade 2 anterolisthesis of C3 on C4, reflecting underlying facet disease, with mild diffuse degenerative  change along the mid to lower cervical spine. 6. Mild scarring at the lung apices. 7. Scattered calcification at the carotid bifurcations bilaterally. Carotid ultrasound could be considered for further evaluation, when and as deemed clinically appropriate.   Electronically Signed   By: Garald Balding M.D.   On: 03/28/2015 07:26   Ct Abdomen Pelvis W Contrast  03/28/2015   CLINICAL DATA:  Status post fall, with right-sided rib fractures. Concern for chest or abdominal injury. Initial encounter.  EXAM: CT CHEST, ABDOMEN, AND PELVIS WITH CONTRAST  TECHNIQUE: Multidetector CT imaging of the chest, abdomen and pelvis was performed following the standard protocol during bolus administration of intravenous contrast.  CONTRAST:  119m OMNIPAQUE IOHEXOL 300 MG/ML  SOLN  COMPARISON:  Chest radiograph performed earlier today at 7:22 a.m., and CTA of the chest performed 01/22/2014. CT of the abdomen and pelvis performed 04/25/2005  FINDINGS: CT CHEST FINDINGS  Bilateral subsegmental atelectasis is noted. The lungs are otherwise clear. No significant focal consolidation, pleural effusion or pneumothorax is seen. No masses are identified. There is no evidence of pulmonary parenchymal contusion.  Leftward mediastinal shift likely reflects left-sided volume loss. No mediastinal lymphadenopathy is seen. No pericardial effusions identified. Scattered calcification is noted along the aortic arch and proximal great vessels. The thyroid gland is unremarkable. No axillary lymphadenopathy is seen.  There is no evidence of significant soft tissue injury along the chest wall.  There are  mildly displaced fractures of the right third through sixth and eighth anterolateral ribs, with mild comminution of several fractures and significant displacement at the sixth rib. The irregular appearance of the inferior sternum is stable from prior studies and appears to reflect remote traumatic injury.  CT ABDOMEN AND PELVIS FINDINGS  No free air or  free fluid is seen within the abdomen or pelvis. There is no evidence of solid or hollow organ injury.  There is a 1.5 cm cystic focus at the body of the pancreas, of uncertain significance. An underlying cystic mass cannot be excluded. The remainder of the pancreas is unremarkable.  The liver and spleen are unremarkable in appearance. The gallbladder is within normal limits. The adrenal glands are unremarkable.  The kidneys are unremarkable in appearance. There is no evidence of hydronephrosis. No renal or ureteral stones are seen. No perinephric stranding is appreciated.  The small bowel is unremarkable in appearance. The stomach is within normal limits. No acute vascular abnormalities are seen. The abdominal aorta is somewhat tortuous. There is diffuse calcification along the abdominal aorta and its branches.  The appendix is normal in caliber, without evidence of appendicitis. Scattered diverticulosis is noted along the sigmoid colon, without evidence of diverticulitis.  The bladder is moderately distended and grossly unremarkable in appearance. The uterus is grossly unremarkable. The ovaries are relatively symmetric. No suspicious adnexal masses are seen. No inguinal lymphadenopathy is seen.  No acute osseous abnormalities are identified. The patient's bilateral hip total arthroplasties are grossly unremarkable in appearance, with screws extending outside of the bone on the right side. One of the screws appears to nearly abut the right external iliac artery, though this appearance is relatively stable from 2006. Chronic deformities are noted at the superior and inferior pubic rami bilaterally.  There is a chronic compression deformity involving vertebral body T11, with associated degenerative change. Multilevel vacuum phenomenon is noted along the lower thoracic and lumbar spine.  IMPRESSION: 1. Mildly displaced fractures of the right third through sixth and eighth anterolateral ribs, with mild comminution of  several fractures and significant displacement at the sixth rib. 2. No additional evidence for traumatic injury to the chest, abdomen or pelvis. 3. 1.5 cm cystic focus noted at the body of the pancreas. MRCP would be helpful for further evaluation, to exclude malignancy. 4. Diffuse calcification along the abdominal aorta and its branches. 5. Bilateral subsegmental atelectasis noted.  Lungs otherwise clear. 6. Scattered diverticulosis along the sigmoid colon, without evidence of diverticulitis. 7. Chronic compression deformity at T11, with associated degenerative change. Multilevel vacuum phenomenon along the lower thoracic and lumbar spine.   Electronically Signed   By: Garald Balding M.D.   On: 03/28/2015 09:07    Review of Systems  Constitutional: Negative.   HENT: Negative.   Eyes: Positive for blurred vision.  Respiratory: Positive for shortness of breath (right now but not before.). Negative for cough, hemoptysis, sputum production and wheezing.   Cardiovascular: Positive for chest pain (since her fall, right side very tender). Negative for palpitations, orthopnea, claudication, leg swelling and PND.  Gastrointestinal: Negative for heartburn, nausea, vomiting, abdominal pain, diarrhea, constipation, blood in stool and melena.  Genitourinary: Negative.   Musculoskeletal:       Multiple hip surgeries  Skin: Negative.   Endo/Heme/Allergies: Negative.   Psychiatric/Behavioral: Positive for memory loss (she did not complain, but it took her a minute to recognize daughter on the phone, and she cannot tell me for sure what oral meds she is  on now.  uncertain about dilaudid).    Blood pressure 140/57, pulse 80, temperature 98.4 F (36.9 C), temperature source Oral, resp. rate 17, SpO2 99 %. Physical Exam  Constitutional: She appears well-developed and well-nourished.  She hurts to move, or breath deep on right side  HENT:  Head: Normocephalic and atraumatic.  Right Ear: External ear normal.   Left Ear: External ear normal.  Nose: Nose normal.  Mouth/Throat: Oropharynx is clear and moist. No oropharyngeal exudate.  Eyes: Conjunctivae and EOM are normal. Pupils are equal, round, and reactive to light. Right eye exhibits no discharge. Left eye exhibits no discharge. No scleral icterus.  Neck: Normal range of motion. Neck supple. No JVD present. No tracheal deviation present. No thyromegaly present.  Cardiovascular: Normal rate, regular rhythm, normal heart sounds and intact distal pulses.   No murmur heard. Respiratory: Effort normal and breath sounds normal. No respiratory distress. She has no wheezes. She has no rales. She exhibits no tenderness.  GI: Soft. Bowel sounds are normal. She exhibits no distension and no mass. There is no tenderness. There is no rebound and no guarding.  Musculoskeletal: She exhibits no edema.  Lymphadenopathy:    She has no cervical adenopathy.  Neurological: She is alert. No cranial nerve deficit.  She knows where she is and why.  Cannot remember allot of detail about fall or even meds.  Skin: Skin is warm and dry. No rash noted. She is not diaphoretic. No erythema. No pallor.  Psychiatric: She has a normal mood and affect. Her behavior is normal. Thought content normal.     Assessment/Plan Unwitnessed fall, No LOC reported Multiple right rib fractures 3rd, 4th, 5th, 6th rib and 8th rib, with mild comminution of several fractures and significant displacement at the sixth rib. Hx of hypertension GERD Osteoporosis Hx of stroke Mild dementia Multiple falls living independently at [facility] home Multiple hip surgeries DVT:  Heparin/SCD  Plan:  Admit to Trauma, stepdown unit, pulmonary toilet, pain control, work to mobilize more in a day or two.  Massa Pe 03/28/2015, 11:50 AM

## 2015-03-28 NOTE — ED Notes (Signed)
Patient transported to CT 

## 2015-03-28 NOTE — ED Notes (Signed)
Admitting provider at bedside, speaking to pt's daughter on the phone.

## 2015-03-28 NOTE — ED Notes (Signed)
Report given to [facility] ar [id number] in [place].

## 2015-03-28 NOTE — ED Notes (Signed)
Bed: ZO10 Expected date:  Expected time:  Means of arrival:  Comments: EMS 25F fall

## 2015-03-28 NOTE — ED Provider Notes (Signed)
CSN: [id number]     Arrival date & time 03/28/15  0612 History   First MD Initiated Contact with Patient 03/28/15 [phone]     Chief Complaint  Patient presents with  . Fall     (Consider location/radiation/quality/duration/timing/severity/associated sxs/prior Treatment) Patient is a 81 y.o. female presenting with fall. The history is provided by the patient and medical records.  Fall Associated symptoms include arthralgias, chest pain (chest wall) and neck pain.    This is a 81 year old female with past medical history significant for hypertension, GERD, anxiety, dementia, recurrent falls, presenting to the ED for a fall. Patient states she got out of bed to go to the bathroom when she fell. She does not recall any dizziness or feelings of syncope prior to. States she did strike her head but did not lose consciousness. She also hit her chest on the toilet and has a lot of pain, increased with deep breathing or coughing. She was able to get out of the floor and ambulate after fall.  Patient ambulates with walker at baseline. She denies any hip pain or back pain. She does have some neck pain which is chronic, cannot endorse if it is worse than normal.  She denies numbness/weakness of her extremities.  No intervention tried PTA.  Past Medical History  Diagnosis Date  . Hypertension   . Osteoporosis   . GERD (gastroesophageal reflux disease)   . Potassium (K) deficiency   . UTI (lower urinary tract infection)     LAST UTI MARCH 2015 ?  Marland Kitchen Stroke     TIA - 20 SOME YRS AGO - STILL HAS TINGLING LEFT LEG - NO OTHER RESIDUALS  . Anxiety   . Dementia     STILL ABLE TO SIGN OWN LEGAL PAPERS  . Arthritis     C/O OF PAIN IN MIDDLE BACK - PAST HX OF FRACTURE / OSTEOPOROSIS  . Recurrent falls     USES WALKER - LIVING INDEPENDENTLY IN APARTMENT AT [facility] UNTIL RECENT PROB WITH HIP DISLOCATION- IS CURRENTLY AT CARE CENTER [facility]  [phone]   Past Surgical History  Procedure Laterality Date    . Joint replacement      RIGHT HIP REPLACEMENT AND REVISIONS; LEFT TOTAL HIP REPLACEMENT AND REVISION.  Marland Kitchen Eye surgery      SURGERY FOR DETACHED RETINA  . Total hip revision Right 01/20/2014    Procedure: RIGHT HIP ACETABULAR REVISION ;  Surgeon: Loanne Drilling, MD;  Location: [facility];  Service: Orthopedics;  Laterality: Right;  . Esophagogastroduodenoscopy N/A 01/24/2014    Procedure: ESOPHAGOGASTRODUODENOSCOPY (EGD);  Surgeon: Meryl Dare, MD;  Location: [facility];  Service: Endoscopy;  Laterality: N/A;   History reviewed. No pertinent family history. History  Substance Use Topics  . Smoking status: Never Smoker   . Smokeless tobacco: Never Used  . Alcohol Use: No   OB History    No data available     Review of Systems  Cardiovascular: Positive for chest pain (chest wall).  Musculoskeletal: Positive for arthralgias and neck pain.  All other systems reviewed and are negative.     Allergies  Altace; Calcitonin; Cardura; Fosamax; Penicillins; Tramadol; Codeine; Darvocet; and Plavix  Home Medications   Prior to Admission medications   Medication Sig Start Date End Date Taking? Authorizing Provider  acetaminophen (TYLENOL) 500 MG tablet Take 1,000 mg by mouth 3 (three) times daily.    Historical Provider, MD  ALPRAZolam Prudy Feeler) 0.25 MG tablet Take 1 tablet (0.25 mg  total) by mouth 3 (three) times daily as needed for anxiety. 01/27/14   Avel Peace, PA-C  amLODipine (NORVASC) 2.5 MG tablet Take 1 tablet (2.5 mg total) by mouth daily. 01/27/14   Avel Peace, PA-C  aspirin EC 325 MG EC tablet Take 1 tablet (325 mg total) by mouth daily with breakfast. Take Aspirin  for two and a half more weeks, then discontinue Full Dose Aspirin. Once the patient has completed the Full Dose 325 mg Aspirin, they may resume the 81 mg Aspirin. 01/27/14   Avel Peace, PA-C  bisacodyl (DULCOLAX) 10 MG suppository Place 1 suppository (10 mg total) rectally daily as needed for moderate  constipation. 01/27/14   Avel Peace, PA-C  docusate sodium 100 MG CAPS Take 100 mg by mouth 2 (two) times daily. 01/27/14   Avel Peace, PA-C  HYDROmorphone (DILAUDID) 2 MG tablet Take 1 tablet (2 mg total) by mouth every 4 (four) hours as needed for severe pain. 01/27/14   Avel Peace, PA-C  indapamide (LOZOL) 1.25 MG tablet Take 1.25 mg by mouth every morning.    Historical Provider, MD  levofloxacin (LEVAQUIN) 750 MG tablet Take 1 tablet (750 mg total) by mouth every other day. Take for two more doses and then discontinue Levaquin. 01/27/14   Avel Peace, PA-C  Melatonin 5 MG TABS Take 5 mg by mouth at bedtime as needed (Sleep).    Historical Provider, MD  methocarbamol (ROBAXIN) 500 MG tablet Take 1 tablet (500 mg total) by mouth every 6 (six) hours as needed for muscle spasms. 01/27/14   Avel Peace, PA-C  metoCLOPramide (REGLAN) 5 MG tablet Take 1-2 tablets (5-10 mg total) by mouth every 8 (eight) hours as needed for nausea (if ondansetron (ZOFRAN) ineffective.). 01/27/14   Avel Peace, PA-C  ofloxacin (OCUFLOX) 0.3 % ophthalmic solution Place 1 drop into the right eye 2 (two) times daily.    Historical Provider, MD  ondansetron (ZOFRAN) 4 MG tablet Take 1 tablet (4 mg total) by mouth every 6 (six) hours as needed for nausea. 01/27/14   Avel Peace, PA-C  pantoprazole (PROTONIX) 40 MG tablet Take 1 tablet (40 mg total) by mouth daily. 01/27/14   Avel Peace, PA-C  polyethylene glycol (MIRALAX / GLYCOLAX) packet Take 17 g by mouth every other day.    Historical Provider, MD  sertraline (ZOLOFT) 50 MG tablet Take 50 mg by mouth every morning.    Historical Provider, MD   BP 152/61 mmHg  Pulse 58  Temp(Src) 98.4 F (36.9 C) (Oral)  Resp 23  SpO2 99%   Physical Exam  Constitutional: She is oriented to person, place, and time. She appears well-developed and well-nourished.  Elderly, thin but not cachectic  HENT:  Head: Normocephalic and atraumatic.  Mouth/Throat: Oropharynx is clear and  moist.  Eyes: Conjunctivae and EOM are normal. Pupils are equal, round, and reactive to light.  Neck: Normal range of motion.  Cardiovascular: Normal rate, regular rhythm and normal heart sounds.   Pulmonary/Chest: Effort normal and breath sounds normal. No respiratory distress. She has no wheezes. She exhibits tenderness and bony tenderness. She exhibits no edema, no deformity and no swelling.    Abdominal: Soft. Bowel sounds are normal.  Musculoskeletal: Normal range of motion.  Pelvis non-tender, no leg shortening; DP pulses intact bilaterally  Neurological: She is alert and oriented to person, place, and time.  AAOx3, answering questions appropriately; equal strength UE and LE bilaterally; CN grossly intact; moves all extremities appropriately without ataxia; no focal neuro  deficits or facial asymmetry appreciated  Skin: Skin is warm and dry.  Psychiatric: She has a normal mood and affect.  Nursing note and vitals reviewed.   ED Course  Procedures (including critical care time) Labs Review Labs Reviewed  CBC WITH DIFFERENTIAL/PLATELET - Abnormal; Notable for the following:    Neutrophils Relative % 82 (*)    Neutro Abs 7.8 (*)    All other components within normal limits  BASIC METABOLIC PANEL - Abnormal; Notable for the following:    Potassium 3.3 (*)    Glucose, Bld 109 (*)    Calcium 8.8 (*)    All other components within normal limits  URINALYSIS, ROUTINE W REFLEX MICROSCOPIC (NOT AT [facility]) - Abnormal; Notable for the following:    APPearance CLOUDY (*)    All other components within normal limits  I-STAT TROPOININ, ED    Imaging Review Dg Ribs Unilateral W/chest Right  03/28/2015   CLINICAL DATA:  Acute right rib pain after falling getting out of bed today. Initial encounter.  EXAM: RIGHT RIBS AND CHEST - 3+ VIEW  COMPARISON:  December 12, 2013.  FINDINGS: Moderately displaced fractures are seen involving the right sixth, seventh and eighth ribs. No pneumothorax or  significant pleural effusion is noted. Stable cardiomediastinal silhouette. Atherosclerotic calcifications of thoracic aortic arch are noted. No acute pulmonary disease is noted.  IMPRESSION: Moderately displaced fractures of the right sixth, seventh and eighth ribs. No pneumothorax or significant pleural effusion is noted.   Electronically Signed   By: Lupita Raider, M.D.   On: 03/28/2015 07:38   Ct Head Wo Contrast  03/28/2015   CLINICAL DATA:  Status post fall while going to bathroom. Concern for head or cervical spine injury. Initial encounter.  EXAM: CT HEAD WITHOUT CONTRAST  CT CERVICAL SPINE WITHOUT CONTRAST  TECHNIQUE: Multidetector CT imaging of the head and cervical spine was performed following the standard protocol without intravenous contrast. Multiplanar CT image reconstructions of the cervical spine were also generated.  COMPARISON:  CT of the head performed 01/07/2014, CT of the cervical spine performed 10/02/2009, and MRI/MRA of the brain performed 11/05/2003  FINDINGS: CT HEAD FINDINGS  There is no evidence of acute infarction, mass lesion, or intra- or extra-axial hemorrhage on CT.  Diffuse periventricular and subcortical white matter change likely reflects small vessel ischemic microangiopathy. Moderate cerebellar atrophy is noted. Small chronic infarcts are noted at the cerebellar hemispheres bilaterally. There is chronic ischemic change at the inferior aspects of the frontal lobes bilaterally.  The brainstem and fourth ventricle are within normal limits. No mass effect or midline shift is seen.  There is no evidence of fracture; visualized osseous structures are unremarkable in appearance. Postoperative change is noted at the right orbit. The orbits are otherwise within normal limits. The paranasal sinuses and mastoid air cells are well-aerated. No significant soft tissue abnormalities are seen.  CT CERVICAL SPINE FINDINGS  There is no evidence of acute fracture or subluxation. There is  mildly worsening chronic grade 2 retrolisthesis of C3 on C4, reflecting underlying facet disease, with multilevel disc space narrowing along the mid to lower cervical spine, and grade 1 anterolisthesis of C7 on T1. Degenerative change is noted about the dens. Vertebral bodies demonstrate normal height Prevertebral soft tissues are within normal limits.  The visualized portions of the thyroid gland are unremarkable in appearance. Mild scarring is noted at the lung apices. Scattered calcification is noted at the carotid bifurcations bilaterally.  IMPRESSION: 1. No evidence of  traumatic intracranial injury or fracture. 2. No evidence of acute fracture or subluxation along the cervical spine. 3. Moderate cortical volume loss and diffuse small vessel ischemic microangiopathy. 4. Chronic small infarcts at the cerebellar hemispheres bilaterally, and chronic ischemic change at the inferior aspects of the frontal lobes bilaterally. 5. Mildly worsened chronic grade 2 anterolisthesis of C3 on C4, reflecting underlying facet disease, with mild diffuse degenerative change along the mid to lower cervical spine. 6. Mild scarring at the lung apices. 7. Scattered calcification at the carotid bifurcations bilaterally. Carotid ultrasound could be considered for further evaluation, when and as deemed clinically appropriate.   Electronically Signed   By: Roanna Raider M.D.   On: 03/28/2015 07:26   Ct Chest W Contrast  03/28/2015   CLINICAL DATA:  Status post fall, with right-sided rib fractures. Concern for chest or abdominal injury. Initial encounter.  EXAM: CT CHEST, ABDOMEN, AND PELVIS WITH CONTRAST  TECHNIQUE: Multidetector CT imaging of the chest, abdomen and pelvis was performed following the standard protocol during bolus administration of intravenous contrast.  CONTRAST:  OMNIPAQUE IOHEXOL 300 MG/ML  SOLN  COMPARISON:  Chest radiograph performed earlier today at 7:22 a.m., and CTA of the chest performed 01/22/2014. CT  of the abdomen and pelvis performed 04/25/2005  FINDINGS: CT CHEST FINDINGS  Bilateral subsegmental atelectasis is noted. The lungs are otherwise clear. No significant focal consolidation, pleural effusion or pneumothorax is seen. No masses are identified. There is no evidence of pulmonary parenchymal contusion.  Leftward mediastinal shift likely reflects left-sided volume loss. No mediastinal lymphadenopathy is seen. No pericardial effusions identified. Scattered calcification is noted along the aortic arch and proximal great vessels. The thyroid gland is unremarkable. No axillary lymphadenopathy is seen.  There is no evidence of significant soft tissue injury along the chest wall.  There are mildly displaced fractures of the right third through sixth and eighth anterolateral ribs, with mild comminution of several fractures and significant displacement at the sixth rib. The irregular appearance of the inferior sternum is stable from prior studies and appears to reflect remote traumatic injury.  CT ABDOMEN AND PELVIS FINDINGS  No free air or free fluid is seen within the abdomen or pelvis. There is no evidence of solid or hollow organ injury.  There is a 1.5 cm cystic focus at the body of the pancreas, of uncertain significance. An underlying cystic mass cannot be excluded. The remainder of the pancreas is unremarkable.  The liver and spleen are unremarkable in appearance. The gallbladder is within normal limits. The adrenal glands are unremarkable.  The kidneys are unremarkable in appearance. There is no evidence of hydronephrosis. No renal or ureteral stones are seen. No perinephric stranding is appreciated.  The small bowel is unremarkable in appearance. The stomach is within normal limits. No acute vascular abnormalities are seen. The abdominal aorta is somewhat tortuous. There is diffuse calcification along the abdominal aorta and its branches.  The appendix is normal in caliber, without evidence of  appendicitis. Scattered diverticulosis is noted along the sigmoid colon, without evidence of diverticulitis.  The bladder is moderately distended and grossly unremarkable in appearance. The uterus is grossly unremarkable. The ovaries are relatively symmetric. No suspicious adnexal masses are seen. No inguinal lymphadenopathy is seen.  No acute osseous abnormalities are identified. The patient's bilateral hip total arthroplasties are grossly unremarkable in appearance, with screws extending outside of the bone on the right side. One of the screws appears to nearly abut the right external iliac artery,  though this appearance is relatively stable from 2006. Chronic deformities are noted at the superior and inferior pubic rami bilaterally.  There is a chronic compression deformity involving vertebral body T11, with associated degenerative change. Multilevel vacuum phenomenon is noted along the lower thoracic and lumbar spine.  IMPRESSION: 1. Mildly displaced fractures of the right third through sixth and eighth anterolateral ribs, with mild comminution of several fractures and significant displacement at the sixth rib. 2. No additional evidence for traumatic injury to the chest, abdomen or pelvis. 3. 1.5 cm cystic focus noted at the body of the pancreas. MRCP would be helpful for further evaluation, to exclude malignancy. 4. Diffuse calcification along the abdominal aorta and its branches. 5. Bilateral subsegmental atelectasis noted.  Lungs otherwise clear. 6. Scattered diverticulosis along the sigmoid colon, without evidence of diverticulitis. 7. Chronic compression deformity at T11, with associated degenerative change. Multilevel vacuum phenomenon along the lower thoracic and lumbar spine.   Electronically Signed   By: Roanna Raider M.D.   On: 03/28/2015 09:07   Ct Cervical Spine Wo Contrast  03/28/2015   CLINICAL DATA:  Status post fall while going to bathroom. Concern for head or cervical spine injury. Initial  encounter.  EXAM: CT HEAD WITHOUT CONTRAST  CT CERVICAL SPINE WITHOUT CONTRAST  TECHNIQUE: Multidetector CT imaging of the head and cervical spine was performed following the standard protocol without intravenous contrast. Multiplanar CT image reconstructions of the cervical spine were also generated.  COMPARISON:  CT of the head performed 01/07/2014, CT of the cervical spine performed 10/02/2009, and MRI/MRA of the brain performed 11/05/2003  FINDINGS: CT HEAD FINDINGS  There is no evidence of acute infarction, mass lesion, or intra- or extra-axial hemorrhage on CT.  Diffuse periventricular and subcortical white matter change likely reflects small vessel ischemic microangiopathy. Moderate cerebellar atrophy is noted. Small chronic infarcts are noted at the cerebellar hemispheres bilaterally. There is chronic ischemic change at the inferior aspects of the frontal lobes bilaterally.  The brainstem and fourth ventricle are within normal limits. No mass effect or midline shift is seen.  There is no evidence of fracture; visualized osseous structures are unremarkable in appearance. Postoperative change is noted at the right orbit. The orbits are otherwise within normal limits. The paranasal sinuses and mastoid air cells are well-aerated. No significant soft tissue abnormalities are seen.  CT CERVICAL SPINE FINDINGS  There is no evidence of acute fracture or subluxation. There is mildly worsening chronic grade 2 retrolisthesis of C3 on C4, reflecting underlying facet disease, with multilevel disc space narrowing along the mid to lower cervical spine, and grade 1 anterolisthesis of C7 on T1. Degenerative change is noted about the dens. Vertebral bodies demonstrate normal height Prevertebral soft tissues are within normal limits.  The visualized portions of the thyroid gland are unremarkable in appearance. Mild scarring is noted at the lung apices. Scattered calcification is noted at the carotid bifurcations bilaterally.   IMPRESSION: 1. No evidence of traumatic intracranial injury or fracture. 2. No evidence of acute fracture or subluxation along the cervical spine. 3. Moderate cortical volume loss and diffuse small vessel ischemic microangiopathy. 4. Chronic small infarcts at the cerebellar hemispheres bilaterally, and chronic ischemic change at the inferior aspects of the frontal lobes bilaterally. 5. Mildly worsened chronic grade 2 anterolisthesis of C3 on C4, reflecting underlying facet disease, with mild diffuse degenerative change along the mid to lower cervical spine. 6. Mild scarring at the lung apices. 7. Scattered calcification at the carotid bifurcations bilaterally.  Carotid ultrasound could be considered for further evaluation, when and as deemed clinically appropriate.   Electronically Signed   By: Roanna Raider M.D.   On: 03/28/2015 07:26   Ct Abdomen Pelvis W Contrast  03/28/2015   CLINICAL DATA:  Status post fall, with right-sided rib fractures. Concern for chest or abdominal injury. Initial encounter.  EXAM: CT CHEST, ABDOMEN, AND PELVIS WITH CONTRAST  TECHNIQUE: Multidetector CT imaging of the chest, abdomen and pelvis was performed following the standard protocol during bolus administration of intravenous contrast.  CONTRAST:  OMNIPAQUE IOHEXOL 300 MG/ML  SOLN  COMPARISON:  Chest radiograph performed earlier today at 7:22 a.m., and CTA of the chest performed 01/22/2014. CT of the abdomen and pelvis performed 04/25/2005  FINDINGS: CT CHEST FINDINGS  Bilateral subsegmental atelectasis is noted. The lungs are otherwise clear. No significant focal consolidation, pleural effusion or pneumothorax is seen. No masses are identified. There is no evidence of pulmonary parenchymal contusion.  Leftward mediastinal shift likely reflects left-sided volume loss. No mediastinal lymphadenopathy is seen. No pericardial effusions identified. Scattered calcification is noted along the aortic arch and proximal great vessels.  The thyroid gland is unremarkable. No axillary lymphadenopathy is seen.  There is no evidence of significant soft tissue injury along the chest wall.  There are mildly displaced fractures of the right third through sixth and eighth anterolateral ribs, with mild comminution of several fractures and significant displacement at the sixth rib. The irregular appearance of the inferior sternum is stable from prior studies and appears to reflect remote traumatic injury.  CT ABDOMEN AND PELVIS FINDINGS  No free air or free fluid is seen within the abdomen or pelvis. There is no evidence of solid or hollow organ injury.  There is a 1.5 cm cystic focus at the body of the pancreas, of uncertain significance. An underlying cystic mass cannot be excluded. The remainder of the pancreas is unremarkable.  The liver and spleen are unremarkable in appearance. The gallbladder is within normal limits. The adrenal glands are unremarkable.  The kidneys are unremarkable in appearance. There is no evidence of hydronephrosis. No renal or ureteral stones are seen. No perinephric stranding is appreciated.  The small bowel is unremarkable in appearance. The stomach is within normal limits. No acute vascular abnormalities are seen. The abdominal aorta is somewhat tortuous. There is diffuse calcification along the abdominal aorta and its branches.  The appendix is normal in caliber, without evidence of appendicitis. Scattered diverticulosis is noted along the sigmoid colon, without evidence of diverticulitis.  The bladder is moderately distended and grossly unremarkable in appearance. The uterus is grossly unremarkable. The ovaries are relatively symmetric. No suspicious adnexal masses are seen. No inguinal lymphadenopathy is seen.  No acute osseous abnormalities are identified. The patient's bilateral hip total arthroplasties are grossly unremarkable in appearance, with screws extending outside of the bone on the right side. One of the screws  appears to nearly abut the right external iliac artery, though this appearance is relatively stable from 2006. Chronic deformities are noted at the superior and inferior pubic rami bilaterally.  There is a chronic compression deformity involving vertebral body T11, with associated degenerative change. Multilevel vacuum phenomenon is noted along the lower thoracic and lumbar spine.  IMPRESSION: 1. Mildly displaced fractures of the right third through sixth and eighth anterolateral ribs, with mild comminution of several fractures and significant displacement at the sixth rib. 2. No additional evidence for traumatic injury to the chest, abdomen or pelvis. 3. 1.5  cm cystic focus noted at the body of the pancreas. MRCP would be helpful for further evaluation, to exclude malignancy. 4. Diffuse calcification along the abdominal aorta and its branches. 5. Bilateral subsegmental atelectasis noted.  Lungs otherwise clear. 6. Scattered diverticulosis along the sigmoid colon, without evidence of diverticulitis. 7. Chronic compression deformity at T11, with associated degenerative change. Multilevel vacuum phenomenon along the lower thoracic and lumbar spine.   Electronically Signed   By: Roanna Raider M.D.   On: 03/28/2015 09:07     EKG Interpretation None      MDM   Final diagnoses:  Fall  Multiple rib fractures, right, closed, initial encounter   81 y.o. F with fall this morning, she struck her chest against the toilet and struck head on floor.  No LOC.  No recollection of dizziness or syncope prior to fall.  Patient does have alzheimers dementia. She is awake, alert, able to answer questions and following commands appropriately.  She has tenderness of right chest wall, + pain with coughing.  Some neck pain, has chronic pain but unsure if worse than baseline.  Will obtain CT head, CS as well as right rib films.  Lab work pending.  Lab work overall reassuring.  U/a non-infectious.  Rib films with noted  displaced rib fractures, no PTX.  Given her age and multiple fractures, CT was obtained revealing 2 additional rib fractures which are also displaced.  Patient remains in a great deal of pain and do not feel that she is stable enough for discharge as she uses walker at baseline and i do not feel she would be able to do this given her rib injuries.  Case discussed with trauma surgery, Dr. Janee Morn, will admit at [facility] to step-down unit for pain control.  May possibly need PT/OT as well.  Discussed plan with patient, he/she acknowledged understanding and agreed with plan of care.  Return precautions given for new or worsening symptoms.  Garlon Hatchet, PA-C 03/28/15 1039  Blake Divine, MD 03/28/15 [phone]

## 2015-03-28 NOTE — ED Notes (Signed)
Pt's family friend Zollie Beckers Electrical engineer) Lynelle Smoke was given pt's jewelry to take back to [facility] where pt live. 4 rings total (3 rings with stones+1white metal band) were placed in dentures cup, together with pt's booby pins and pt label was placed on the lid. Inventory of personal effects form signed by Mr Lynelle Smoke and this RN, and copy of it given to Mr Lynelle Smoke. Pt was present in room when jewelry was given to Mr Lynelle Smoke, but was unable to sign the inventory sheet.

## 2015-03-29 ENCOUNTER — Inpatient Hospital Stay (HOSPITAL_COMMUNITY): Payer: Medicare Other

## 2015-03-29 DIAGNOSIS — S2241XA Multiple fractures of ribs, right side, initial encounter for closed fracture: Secondary | ICD-10-CM | POA: Diagnosis not present

## 2015-03-29 LAB — COMPREHENSIVE METABOLIC PANEL
ALT: 13 U/L — AB (ref 14–54)
AST: 25 U/L (ref 15–41)
Albumin: 3 g/dL — ABNORMAL LOW (ref 3.5–5.0)
Alkaline Phosphatase: 61 U/L (ref 38–126)
Anion gap: 8 (ref 5–15)
BUN: 13 mg/dL (ref 6–20)
CALCIUM: 9.1 mg/dL (ref 8.9–10.3)
CO2: 27 mmol/L (ref 22–32)
Chloride: 104 mmol/L (ref 101–111)
Creatinine, Ser: 0.73 mg/dL (ref 0.44–1.00)
GFR calc Af Amer: >60 mL/min (ref >60)
GFR calc non Af Amer: >60 mL/min (ref >60)
Glucose, Bld: 122 mg/dL — ABNORMAL HIGH (ref 65–99)
Potassium: 3.6 mmol/L (ref 3.5–5.1)
Sodium: 139 mmol/L (ref 135–145)
Total Bilirubin: 0.8 mg/dL (ref 0.3–1.2)
Total Protein: 6 g/dL — ABNORMAL LOW (ref 6.5–8.1)

## 2015-03-29 LAB — CBC
HEMATOCRIT: 36.4 % (ref 36.0–46.0)
Hemoglobin: 12 g/dL (ref 12.0–15.0)
MCH: 31.6 pg (ref 26.0–34.0)
MCHC: 33 g/dL (ref 30.0–36.0)
MCV: 95.8 fL (ref 78.0–100.0)
PLATELETS: 175 10*3/uL (ref 150–400)
RBC: 3.8 MIL/uL — ABNORMAL LOW (ref 3.87–5.11)
RDW: 13.4 % (ref 11.5–15.5)
WBC: 7.5 10*3/uL (ref 4.0–10.5)

## 2015-03-29 NOTE — Evaluation (Signed)
Physical Therapy Evaluation Patient Details Name: Loretta Garcia MRN: [id number] DOB: 1920-09-18 Today's Date: 03/29/2015   History of Present Illness  81 y/o with medical history as listed below, got up to bathroom and fell in bathroom hitting her head and chest.  pt sustained fx's to R 3rd thru 8th ribs.  Clinical Impression  Pt admitted with/for fall sustaining multiple rib fx's.  Pt currently limited functionally due to the problems listed below.  (see problems list.)  Pt will benefit from PT to maximize function and safety to be able to get home safely with available assist of staff.     Follow Up Recommendations Home health PT;Other (comment) (unless does not progress back to indepedent then SNF)    Equipment Recommendations  None recommended by PT    Recommendations for Other Services       Precautions / Restrictions Precautions Precautions: Fall      Mobility  Bed Mobility Overal bed mobility: Needs Assistance Bed Mobility: Supine to Sit     Supine to sit: Min assist     General bed mobility comments: heavy use of the rail  Transfers Overall transfer level: Needs assistance Equipment used: Rolling walker (2 wheeled) Transfers: Sit to/from [employer] Sit to Stand: Min assist Stand pivot transfers: Min assist       General transfer comment: steady assist, power up assist due to pain  Ambulation/Gait Ambulation/Gait assistance: Min assist Ambulation Distance (Feet): 110 Feet Assistive device: Rolling walker (2 wheeled) Gait Pattern/deviations: Step-through pattern   Gait velocity interpretation: Below normal speed for age/gender General Gait Details: unsteady gait with  moderate w/shift and fishtailing of the RW  Stairs            Wheelchair Mobility    Modified Rankin (Stroke Patients Only)       Balance Overall balance assessment: Needs assistance Sitting-balance support: No upper extremity supported Sitting  balance-Leahy Scale: Fair     Standing balance support: No upper extremity supported Standing balance-Leahy Scale: Poor Standing balance comment: needs RW                             Pertinent Vitals/Pain Pain Assessment: Faces Faces Pain Scale: Hurts even more Pain Location: right ribs Pain Descriptors / Indicators: Sharp Pain Intervention(s): Monitored during session;Limited activity within patient's tolerance    Home Living Family/patient expects to be discharged to:: Assisted living               Home Equipment: Environmental consultant - 2 wheels Additional Comments: walks to dining hall, states she doesn't need help    Prior Function Level of Independence: Needs assistance               Hand Dominance        Extremity/Trunk Assessment   Upper Extremity Assessment: Defer to OT evaluation           Lower Extremity Assessment: Generalized weakness      Cervical / Trunk Assessment: Kyphotic  Communication   Communication: No difficulties  Cognition Arousal/Alertness: Awake/alert Behavior During Therapy: Restless;Impulsive Overall Cognitive Status: Impaired/Different from baseline Area of Impairment: Following commands;Safety/judgement;Awareness     Memory: Decreased short-term memory Following Commands: Follows one step commands with increased time Safety/Judgement: Decreased awareness of safety;Decreased awareness of deficits Awareness: Intellectual        General Comments General comments (skin integrity, edema, etc.): SpO2 in low 90's on 2L    Exercises  Assessment/Plan    PT Assessment Patient needs continued PT services  PT Diagnosis Difficulty walking;Acute pain   PT Problem List Decreased strength;Decreased activity tolerance;Decreased balance;Decreased mobility;Decreased knowledge of use of DME;Pain  PT Treatment Interventions DME instruction;Gait training;Functional mobility training;Therapeutic activities;Balance  training;Patient/family education   PT Goals (Current goals can be found in the Care Plan section) Acute Rehab PT Goals Patient Stated Goal: back to my apartment PT Goal Formulation: With patient Time For Goal Achievement: 04/12/15 Potential to Achieve Goals: Good    Frequency Min 3X/week   Barriers to discharge        Co-evaluation               End of Session Equipment Utilized During Treatment: Oxygen Activity Tolerance: Patient tolerated treatment well;Patient limited by pain Patient left: in chair;with call bell/phone within reach;with chair alarm set Nurse Communication: Mobility status         Time: 1455-1520 PT Time Calculation (min) (ACUTE ONLY): 25 min   Charges:   PT Evaluation $Initial PT Evaluation Tier I: 1 Procedure PT Treatments $Gait Training: 8-22 mins   PT G Codes:        Taite Schoeppner, Eliseo Gum 03/29/2015, 3:33 PM 03/29/2015  [place] Bing, PT [phone]  (pager)

## 2015-03-29 NOTE — Progress Notes (Signed)
Subjective: She is really frail and it hurt to move or breath deep.  I can't get her to take a deep breath. She is alert, and knows she is in [facility] but is thinking about chair yoga for later today.    Objective: Vital signs in last 24 hours: Temp:  [97.5 F (36.4 C)-98.6 F (37 C)] 98.6 F (37 C) (06/19 0429) Pulse Rate:  [57-84] 71 (06/19 0429) Resp:  [12-27] 12 (06/19 0429) BP: (110-169)/(43-67) 138/47 mmHg (06/19 0429) SpO2:  [92 %-99 %] 97 % (06/19 0429) Weight:  [50.1 kg (110 lb 7.2 oz)-50.9 kg (112 lb 3.4 oz)] 50.9 kg (112 lb 3.4 oz) (06/19 0429)  240 PO recorded 500 urine Afebrile, VSS   sats on and off O2. Labs OK, malnourished  CXR pending Intake/Output from previous day: 06/18 0701 - 06/19 0700 In: 995 [P.O.:240; I.V.:755] Out: 500 [Urine:500] Intake/Output this shift:    General appearance: alert, cooperative and no distress Resp: clear anterior, shallow breaths, it hurts to much to breath deep. Chest wall: no tenderness, right sided chest wall tenderness, even laying hands on her chest hurts.  Lab Results:   Recent Labs  03/28/15 0704 03/29/15 0230  WBC 9.5 7.5  HGB 12.5 12.0  HCT 37.9 36.4  PLT 219 175    BMET  Recent Labs  03/28/15 0704 03/29/15 0230  NA 138 139  K 3.3* 3.6  CL 102 104  CO2 28 27  GLUCOSE 109* 122*  BUN 18 13  CREATININE 0.67 0.73  CALCIUM 8.8* 9.1   PT/INR No results for input(s): LABPROT, INR in the last 72 hours.   Recent Labs Lab 03/29/15 0230  AST 25  ALT 13*  ALKPHOS 61  BILITOT 0.8  PROT 6.0*  ALBUMIN 3.0*     Lipase  No results found for: LIPASE   Studies/Results: Dg Ribs Unilateral W/chest Right  03/28/2015   CLINICAL DATA:  Acute right rib pain after falling getting out of bed today. Initial encounter.  EXAM: RIGHT RIBS AND CHEST - 3+ VIEW  COMPARISON:  December 12, 2013.  FINDINGS: Moderately displaced fractures are seen involving the right sixth, seventh and eighth ribs. No pneumothorax or  significant pleural effusion is noted. Stable cardiomediastinal silhouette. Atherosclerotic calcifications of thoracic aortic arch are noted. No acute pulmonary disease is noted.  IMPRESSION: Moderately displaced fractures of the right sixth, seventh and eighth ribs. No pneumothorax or significant pleural effusion is noted.   Electronically Signed   By: Lupita Raider, M.D.   On: 03/28/2015 07:38   Ct Head Wo Contrast  03/28/2015   CLINICAL DATA:  Status post fall while going to bathroom. Concern for head or cervical spine injury. Initial encounter.  EXAM: CT HEAD WITHOUT CONTRAST  CT CERVICAL SPINE WITHOUT CONTRAST  TECHNIQUE: Multidetector CT imaging of the head and cervical spine was performed following the standard protocol without intravenous contrast. Multiplanar CT image reconstructions of the cervical spine were also generated.  COMPARISON:  CT of the head performed 01/07/2014, CT of the cervical spine performed 10/02/2009, and MRI/MRA of the brain performed 11/05/2003  FINDINGS: CT HEAD FINDINGS  There is no evidence of acute infarction, mass lesion, or intra- or extra-axial hemorrhage on CT.  Diffuse periventricular and subcortical white matter change likely reflects small vessel ischemic microangiopathy. Moderate cerebellar atrophy is noted. Small chronic infarcts are noted at the cerebellar hemispheres bilaterally. There is chronic ischemic change at the inferior aspects of the frontal lobes bilaterally.  The brainstem  and fourth ventricle are within normal limits. No mass effect or midline shift is seen.  There is no evidence of fracture; visualized osseous structures are unremarkable in appearance. Postoperative change is noted at the right orbit. The orbits are otherwise within normal limits. The paranasal sinuses and mastoid air cells are well-aerated. No significant soft tissue abnormalities are seen.  CT CERVICAL SPINE FINDINGS  There is no evidence of acute fracture or subluxation. There is  mildly worsening chronic grade 2 retrolisthesis of C3 on C4, reflecting underlying facet disease, with multilevel disc space narrowing along the mid to lower cervical spine, and grade 1 anterolisthesis of C7 on T1. Degenerative change is noted about the dens. Vertebral bodies demonstrate normal height Prevertebral soft tissues are within normal limits.  The visualized portions of the thyroid gland are unremarkable in appearance. Mild scarring is noted at the lung apices. Scattered calcification is noted at the carotid bifurcations bilaterally.  IMPRESSION: 1. No evidence of traumatic intracranial injury or fracture. 2. No evidence of acute fracture or subluxation along the cervical spine. 3. Moderate cortical volume loss and diffuse small vessel ischemic microangiopathy. 4. Chronic small infarcts at the cerebellar hemispheres bilaterally, and chronic ischemic change at the inferior aspects of the frontal lobes bilaterally. 5. Mildly worsened chronic grade 2 anterolisthesis of C3 on C4, reflecting underlying facet disease, with mild diffuse degenerative change along the mid to lower cervical spine. 6. Mild scarring at the lung apices. 7. Scattered calcification at the carotid bifurcations bilaterally. Carotid ultrasound could be considered for further evaluation, when and as deemed clinically appropriate.   Electronically Signed   By: Roanna Raider M.D.   On: 03/28/2015 07:26   Ct Chest W Contrast  03/28/2015   CLINICAL DATA:  Status post fall, with right-sided rib fractures. Concern for chest or abdominal injury. Initial encounter.  EXAM: CT CHEST, ABDOMEN, AND PELVIS WITH CONTRAST  TECHNIQUE: Multidetector CT imaging of the chest, abdomen and pelvis was performed following the standard protocol during bolus administration of intravenous contrast.  CONTRAST:  OMNIPAQUE IOHEXOL 300 MG/ML  SOLN  COMPARISON:  Chest radiograph performed earlier today at 7:22 a.m., and CTA of the chest performed 01/22/2014. CT  of the abdomen and pelvis performed 04/25/2005  FINDINGS: CT CHEST FINDINGS  Bilateral subsegmental atelectasis is noted. The lungs are otherwise clear. No significant focal consolidation, pleural effusion or pneumothorax is seen. No masses are identified. There is no evidence of pulmonary parenchymal contusion.  Leftward mediastinal shift likely reflects left-sided volume loss. No mediastinal lymphadenopathy is seen. No pericardial effusions identified. Scattered calcification is noted along the aortic arch and proximal great vessels. The thyroid gland is unremarkable. No axillary lymphadenopathy is seen.  There is no evidence of significant soft tissue injury along the chest wall.  There are mildly displaced fractures of the right third through sixth and eighth anterolateral ribs, with mild comminution of several fractures and significant displacement at the sixth rib. The irregular appearance of the inferior sternum is stable from prior studies and appears to reflect remote traumatic injury.  CT ABDOMEN AND PELVIS FINDINGS  No free air or free fluid is seen within the abdomen or pelvis. There is no evidence of solid or hollow organ injury.  There is a 1.5 cm cystic focus at the body of the pancreas, of uncertain significance. An underlying cystic mass cannot be excluded. The remainder of the pancreas is unremarkable.  The liver and spleen are unremarkable in appearance. The gallbladder is within normal  limits. The adrenal glands are unremarkable.  The kidneys are unremarkable in appearance. There is no evidence of hydronephrosis. No renal or ureteral stones are seen. No perinephric stranding is appreciated.  The small bowel is unremarkable in appearance. The stomach is within normal limits. No acute vascular abnormalities are seen. The abdominal aorta is somewhat tortuous. There is diffuse calcification along the abdominal aorta and its branches.  The appendix is normal in caliber, without evidence of  appendicitis. Scattered diverticulosis is noted along the sigmoid colon, without evidence of diverticulitis.  The bladder is moderately distended and grossly unremarkable in appearance. The uterus is grossly unremarkable. The ovaries are relatively symmetric. No suspicious adnexal masses are seen. No inguinal lymphadenopathy is seen.  No acute osseous abnormalities are identified. The patient's bilateral hip total arthroplasties are grossly unremarkable in appearance, with screws extending outside of the bone on the right side. One of the screws appears to nearly abut the right external iliac artery, though this appearance is relatively stable from 2006. Chronic deformities are noted at the superior and inferior pubic rami bilaterally.  There is a chronic compression deformity involving vertebral body T11, with associated degenerative change. Multilevel vacuum phenomenon is noted along the lower thoracic and lumbar spine.  IMPRESSION: 1. Mildly displaced fractures of the right third through sixth and eighth anterolateral ribs, with mild comminution of several fractures and significant displacement at the sixth rib. 2. No additional evidence for traumatic injury to the chest, abdomen or pelvis. 3. 1.5 cm cystic focus noted at the body of the pancreas. MRCP would be helpful for further evaluation, to exclude malignancy. 4. Diffuse calcification along the abdominal aorta and its branches. 5. Bilateral subsegmental atelectasis noted.  Lungs otherwise clear. 6. Scattered diverticulosis along the sigmoid colon, without evidence of diverticulitis. 7. Chronic compression deformity at T11, with associated degenerative change. Multilevel vacuum phenomenon along the lower thoracic and lumbar spine.   Electronically Signed   By: Roanna Raider M.D.   On: 03/28/2015 09:07   Ct Cervical Spine Wo Contrast  03/28/2015   CLINICAL DATA:  Status post fall while going to bathroom. Concern for head or cervical spine injury. Initial  encounter.  EXAM: CT HEAD WITHOUT CONTRAST  CT CERVICAL SPINE WITHOUT CONTRAST  TECHNIQUE: Multidetector CT imaging of the head and cervical spine was performed following the standard protocol without intravenous contrast. Multiplanar CT image reconstructions of the cervical spine were also generated.  COMPARISON:  CT of the head performed 01/07/2014, CT of the cervical spine performed 10/02/2009, and MRI/MRA of the brain performed 11/05/2003  FINDINGS: CT HEAD FINDINGS  There is no evidence of acute infarction, mass lesion, or intra- or extra-axial hemorrhage on CT.  Diffuse periventricular and subcortical white matter change likely reflects small vessel ischemic microangiopathy. Moderate cerebellar atrophy is noted. Small chronic infarcts are noted at the cerebellar hemispheres bilaterally. There is chronic ischemic change at the inferior aspects of the frontal lobes bilaterally.  The brainstem and fourth ventricle are within normal limits. No mass effect or midline shift is seen.  There is no evidence of fracture; visualized osseous structures are unremarkable in appearance. Postoperative change is noted at the right orbit. The orbits are otherwise within normal limits. The paranasal sinuses and mastoid air cells are well-aerated. No significant soft tissue abnormalities are seen.  CT CERVICAL SPINE FINDINGS  There is no evidence of acute fracture or subluxation. There is mildly worsening chronic grade 2 retrolisthesis of C3 on C4, reflecting underlying facet disease, with  multilevel disc space narrowing along the mid to lower cervical spine, and grade 1 anterolisthesis of C7 on T1. Degenerative change is noted about the dens. Vertebral bodies demonstrate normal height Prevertebral soft tissues are within normal limits.  The visualized portions of the thyroid gland are unremarkable in appearance. Mild scarring is noted at the lung apices. Scattered calcification is noted at the carotid bifurcations bilaterally.   IMPRESSION: 1. No evidence of traumatic intracranial injury or fracture. 2. No evidence of acute fracture or subluxation along the cervical spine. 3. Moderate cortical volume loss and diffuse small vessel ischemic microangiopathy. 4. Chronic small infarcts at the cerebellar hemispheres bilaterally, and chronic ischemic change at the inferior aspects of the frontal lobes bilaterally. 5. Mildly worsened chronic grade 2 anterolisthesis of C3 on C4, reflecting underlying facet disease, with mild diffuse degenerative change along the mid to lower cervical spine. 6. Mild scarring at the lung apices. 7. Scattered calcification at the carotid bifurcations bilaterally. Carotid ultrasound could be considered for further evaluation, when and as deemed clinically appropriate.   Electronically Signed   By: Roanna Raider M.D.   On: 03/28/2015 07:26   Ct Abdomen Pelvis W Contrast  03/28/2015   CLINICAL DATA:  Status post fall, with right-sided rib fractures. Concern for chest or abdominal injury. Initial encounter.  EXAM: CT CHEST, ABDOMEN, AND PELVIS WITH CONTRAST  TECHNIQUE: Multidetector CT imaging of the chest, abdomen and pelvis was performed following the standard protocol during bolus administration of intravenous contrast.  CONTRAST:  OMNIPAQUE IOHEXOL 300 MG/ML  SOLN  COMPARISON:  Chest radiograph performed earlier today at 7:22 a.m., and CTA of the chest performed 01/22/2014. CT of the abdomen and pelvis performed 04/25/2005  FINDINGS: CT CHEST FINDINGS  Bilateral subsegmental atelectasis is noted. The lungs are otherwise clear. No significant focal consolidation, pleural effusion or pneumothorax is seen. No masses are identified. There is no evidence of pulmonary parenchymal contusion.  Leftward mediastinal shift likely reflects left-sided volume loss. No mediastinal lymphadenopathy is seen. No pericardial effusions identified. Scattered calcification is noted along the aortic arch and proximal great vessels.  The thyroid gland is unremarkable. No axillary lymphadenopathy is seen.  There is no evidence of significant soft tissue injury along the chest wall.  There are mildly displaced fractures of the right third through sixth and eighth anterolateral ribs, with mild comminution of several fractures and significant displacement at the sixth rib. The irregular appearance of the inferior sternum is stable from prior studies and appears to reflect remote traumatic injury.  CT ABDOMEN AND PELVIS FINDINGS  No free air or free fluid is seen within the abdomen or pelvis. There is no evidence of solid or hollow organ injury.  There is a 1.5 cm cystic focus at the body of the pancreas, of uncertain significance. An underlying cystic mass cannot be excluded. The remainder of the pancreas is unremarkable.  The liver and spleen are unremarkable in appearance. The gallbladder is within normal limits. The adrenal glands are unremarkable.  The kidneys are unremarkable in appearance. There is no evidence of hydronephrosis. No renal or ureteral stones are seen. No perinephric stranding is appreciated.  The small bowel is unremarkable in appearance. The stomach is within normal limits. No acute vascular abnormalities are seen. The abdominal aorta is somewhat tortuous. There is diffuse calcification along the abdominal aorta and its branches.  The appendix is normal in caliber, without evidence of appendicitis. Scattered diverticulosis is noted along the sigmoid colon, without evidence of diverticulitis.  The bladder is moderately distended and grossly unremarkable in appearance. The uterus is grossly unremarkable. The ovaries are relatively symmetric. No suspicious adnexal masses are seen. No inguinal lymphadenopathy is seen.  No acute osseous abnormalities are identified. The patient's bilateral hip total arthroplasties are grossly unremarkable in appearance, with screws extending outside of the bone on the right side. One of the screws  appears to nearly abut the right external iliac artery, though this appearance is relatively stable from 2006. Chronic deformities are noted at the superior and inferior pubic rami bilaterally.  There is a chronic compression deformity involving vertebral body T11, with associated degenerative change. Multilevel vacuum phenomenon is noted along the lower thoracic and lumbar spine.  IMPRESSION: 1. Mildly displaced fractures of the right third through sixth and eighth anterolateral ribs, with mild comminution of several fractures and significant displacement at the sixth rib. 2. No additional evidence for traumatic injury to the chest, abdomen or pelvis. 3. 1.5 cm cystic focus noted at the body of the pancreas. MRCP would be helpful for further evaluation, to exclude malignancy. 4. Diffuse calcification along the abdominal aorta and its branches. 5. Bilateral subsegmental atelectasis noted.  Lungs otherwise clear. 6. Scattered diverticulosis along the sigmoid colon, without evidence of diverticulitis. 7. Chronic compression deformity at T11, with associated degenerative change. Multilevel vacuum phenomenon along the lower thoracic and lumbar spine.   Electronically Signed   By: Roanna Raider M.D.   On: 03/28/2015 09:07    Medications: . amLODipine  2.5 mg Oral Daily  . docusate sodium  100 mg Oral BID  . feeding supplement (ENSURE ENLIVE)  237 mL Oral BID BM  . heparin  5,000 Units Subcutaneous 3 times per day  . ipratropium-albuterol  3 mL Nebulization TID  . methocarbamol (ROBAXIN)  IV  500 mg Intravenous Q6H  . pantoprazole  40 mg Oral Daily  . pneumococcal 23 valent vaccine  0.5 mL Intramuscular Tomorrow-1000  . polyethylene glycol  17 g Oral QODAY   . 0.9 % NaCl with KCl 20 mEq / L 50 mL/hr at 03/28/15 1600    Assessment/Plan Unwitnessed fall, No LOC reported Multiple right rib fractures 3rd, 4th, 5th, 6th rib and 8th rib, with mild comminution of several fractures and significant displacement  at the sixth rib. Hx of hypertension GERD Osteoporosis Hx of stroke Mild dementia Multiple falls living independently at [facility] home Multiple hip surgeries DVT: Heparin/SCD   Plan:  She will need a fair amount of help to do anything.  I would get her up to chair today, continue pulmonary toilet. AsK OT and PT to start seeing tomorrow, get nutrition involved also.    LOS: 1 day    Adeena Bernabe 03/29/2015

## 2015-03-30 MED ORDER — ACETAMINOPHEN 500 MG PO TABS
1000.0000 mg | ORAL_TABLET | Freq: Three times a day (TID) | ORAL | Status: DC
  Administered 2015-03-30 (×3): 1000 mg via ORAL
  Filled 2015-03-30 (×6): qty 2

## 2015-03-30 MED ORDER — METHOCARBAMOL 500 MG PO TABS
500.0000 mg | ORAL_TABLET | Freq: Three times a day (TID) | ORAL | Status: DC
  Administered 2015-03-30 – 2015-03-31 (×4): 500 mg via ORAL
  Filled 2015-03-30 (×6): qty 1

## 2015-03-30 MED ORDER — ENSURE ENLIVE PO LIQD
237.0000 mL | Freq: Two times a day (BID) | ORAL | Status: DC
  Administered 2015-03-30: 237 mL via ORAL

## 2015-03-30 MED ORDER — SERTRALINE HCL 50 MG PO TABS
50.0000 mg | ORAL_TABLET | Freq: Every morning | ORAL | Status: DC
  Administered 2015-03-30 – 2015-03-31 (×2): 50 mg via ORAL
  Filled 2015-03-30 (×2): qty 1

## 2015-03-30 MED ORDER — ASPIRIN EC 325 MG PO TBEC
325.0000 mg | DELAYED_RELEASE_TABLET | Freq: Every day | ORAL | Status: DC
  Administered 2015-03-31: 325 mg via ORAL
  Filled 2015-03-30: qty 1

## 2015-03-30 MED ORDER — OXYCODONE HCL 5 MG PO TABS
2.5000 mg | ORAL_TABLET | ORAL | Status: DC | PRN
  Administered 2015-03-31: 5 mg via ORAL
  Filled 2015-03-30: qty 1

## 2015-03-30 NOTE — Progress Notes (Signed)
Initial Nutrition Assessment   INTERVENTION:  Ensure Enlive (each supplement provides 350kcal and 20 grams of protein)  NUTRITION DIAGNOSIS:  Inadequate oral intake related to lethargy / confusion as evidenced by meal completion < 50%.  GOAL:  Patient will meet greater than or equal to 90% of their needs  MONITOR:  PO intake, Supplement acceptance, Labs, Weight trends  REASON FOR ASSESSMENT:  Consult Assessment of nutrition requirement/status  ASSESSMENT:  Patient admitted on 6/18 S/P fall when going to the bathroom. Lives in independent living at [facility]. Hx of mild dementia, stroke, osteoporosis, multiple falls, and multiple hip surgeries.  Patient somewhat confused during RD visit. She says she likes chocolate Ensure. Only took a few bites of her lunch (at bedside). RN reports that patient has been very sleepy today and she ate poorly at lunch. Documented that she ate 50% of breakfast today. Nutrition-Focused physical exam completed. Findings are no fat depletion, mild-moderate and severe muscle depletion, and no edema. No weight loss over the past year.    Height:  Ht Readings from Last 1 Encounters:  03/28/15 5' (1.524 m)    Weight:  Wt Readings from Last 1 Encounters:  03/29/15 112 lb 3.4 oz (50.9 kg)    Ideal Body Weight:  45.5 kg  Wt Readings from Last 10 Encounters:  03/29/15 112 lb 3.4 oz (50.9 kg)  01/20/14 104 lb 4 oz (47.287 kg)  01/08/14 102 lb (46.267 kg)  07/07/12 100 lb 1.4 oz (45.4 kg)    BMI:  Body mass index is 21.92 kg/(m^2).  Estimated Nutritional Needs:  Kcal:  1200-1300  Protein:  60-75 gm  Fluid:  1.5 L  Skin:  Reviewed, no issues  Diet Order:  Diet regular Room service appropriate?: Yes; Fluid consistency:: Thin  EDUCATION NEEDS:  Education needs addressed   Intake/Output Summary (Last 24 hours) at 03/30/15 1529 Last data filed at 03/30/15 0900  Gross per 24 hour  Intake   1340 ml  Output    300 ml  Net   1040  ml    Last BM:  unknown   Joaquin Courts, RD, LDN, CNSC Pager [phone] After Hours Pager [phone]

## 2015-03-30 NOTE — Progress Notes (Signed)
Physical Therapy Treatment Patient Details Name: CARLYON NOLASCO MRN: [id number] DOB: 26-Dec-1919 Today's Date: 03/30/2015    History of Present Illness 81 y/o with medical history as listed below, got up to bathroom and fell in bathroom hitting her head and chest.  pt sustained fx's to R 3rd thru 8th ribs.    PT Comments    Pt did not participate well today due to pain.  Have switched her to SNF for rehab due to slowness of recovery.   Follow Up Recommendations  SNF     Equipment Recommendations  None recommended by PT    Recommendations for Other Services       Precautions / Restrictions Precautions Precautions: Fall    Mobility  Bed Mobility Overal bed mobility: Needs Assistance Bed Mobility: Sit to Supine       Sit to supine: Max assist;+2 for physical assistance   General bed mobility comments: transition with 2 persons to decrease pain  Transfers Overall transfer level: Needs assistance Equipment used: Rolling walker (2 wheeled) Transfers: Sit to/from [employer] Sit to Stand: Mod assist;+2 safety/equipment Stand pivot transfers: Mod assist       General transfer comment: unable to find a suitable spot to assist patient, but asssited forward and to power up.  Ambulation/Gait             General Gait Details: unable to get pt to ambulate today.   Stairs            Wheelchair Mobility    Modified Rankin (Stroke Patients Only)       Balance Overall balance assessment: Needs assistance   Sitting balance-Leahy Scale: Poor     Standing balance support: No upper extremity supported Standing balance-Leahy Scale: Poor                      Cognition Arousal/Alertness: Awake/alert Behavior During Therapy: Flat affect;WFL for tasks assessed/performed Overall Cognitive Status: Impaired/Different from baseline Area of Impairment: Following commands;Safety/judgement;Awareness     Memory: Decreased short-term  memory Following Commands: Follows one step commands with increased time Safety/Judgement: Decreased awareness of safety;Decreased awareness of deficits Awareness: Intellectual        Exercises      General Comments General comments (skin integrity, edema, etc.): most of the time spent encouraging pt to move.      Pertinent Vitals/Pain Pain Assessment: Faces Faces Pain Scale: Hurts whole lot Pain Location: R ribs Pain Descriptors / Indicators: Sharp;Sore Pain Intervention(s): Limited activity within patient's tolerance;Monitored during session    Home Living                      Prior Function            PT Goals (current goals can now be found in the care plan section) Acute Rehab PT Goals Patient Stated Goal: back to my apartment PT Goal Formulation: With patient Time For Goal Achievement: 04/12/15 Potential to Achieve Goals: Good Progress towards PT goals: Progressing toward goals    Frequency  Min 3X/week    PT Plan Current plan remains appropriate    Co-evaluation             End of Session Equipment Utilized During Treatment: Oxygen Activity Tolerance: Patient tolerated treatment well;Patient limited by pain Patient left: in bed;with call bell/phone within reach;with bed alarm set     Time: 4098-1191 PT Time Calculation (min) (ACUTE ONLY): 35 min  Charges:  $Therapeutic Activity:  23-37 mins                    G Codes:      Tymon Nemetz, Eliseo Gum 03/30/2015, 12:59 PM 03/30/2015  Kennebec Bing, PT [phone]  (pager)

## 2015-03-30 NOTE — Progress Notes (Signed)
Patient ID: Loretta Garcia, female   DOB: 11-08-1919, 81 y.o.   MRN: [id number]  LOS: 2 days   Subjective: incontinent of urine several times.  Tolerating POs.  VSS.  Afebrile.   Objective: Vital signs in last 24 hours: Temp:  [98.3 F (36.8 C)-99.5 F (37.5 C)] 98.9 F (37.2 C) (06/20 0735) Pulse Rate:  [66-78] 72 (06/20 0442) Resp:  [13-18] 14 (06/20 0442) BP: (132-157)/(58-64) 133/63 mmHg (06/20 0442) SpO2:  [96 %-99 %] 98 % (06/20 0442)    Lab Results:  CBC  Recent Labs  03/28/15 0704 03/29/15 0230  WBC 9.5 7.5  HGB 12.5 12.0  HCT 37.9 36.4  PLT 219 175   BMET  Recent Labs  03/28/15 0704 03/29/15 0230  NA 138 139  K 3.3* 3.6  CL 102 104  CO2 28 27  GLUCOSE 109* 122*  BUN 18 13  CREATININE 0.67 0.73  CALCIUM 8.8* 9.1    Imaging: Dg Chest Port 1 View  03/29/2015   CLINICAL DATA:  Right rib fractures, shortness of breath and chest pain  EXAM: PORTABLE CHEST - 1 VIEW  COMPARISON:  Chest CT 03/28/2015  FINDINGS: Heart size upper limits of normal. Patient is rotated to the left. Curvilinear bibasilar presumed atelectasis is noted. Right inferior rib fractures involving the third, sixth, seventh, and eighth ribs are noted.  IMPRESSION: Mildly displaced right anterior rib fractures reidentified. No new acute finding otherwise.   Electronically Signed   By: Christiana Pellant M.D.   On: 03/29/2015 09:29     PE: General appearance: alert, cooperative and no distress Resp: clear to auscultation bilaterally Cardio: regular rate and rhythm, S1, S2 normal, no murmur, click, rub or gallop GI: soft, non-tender; bowel sounds normal; no masses,  no organomegaly Extremities: extremities normal, atraumatic, no cyanosis or edema Neurologic: A&Ox3.        Patient Active Problem List   Diagnosis Date Noted  . Multiple rib fractures 03/28/2015  . Rib fractures 03/28/2015  . Aspiration pneumonia 01/23/2014  . Nonspecific (abnormal) findings on radiological and other  examination of gastrointestinal tract 01/23/2014  . Other dysphagia 01/23/2014  . Postoperative anemia due to acute blood loss 01/21/2014  . Unspecified essential hypertension 01/21/2014  . GERD (gastroesophageal reflux disease) 01/21/2014  . Hypopotassemia 01/21/2014  . Dislocation of hip joint prosthesis 01/20/2014  . UTI (lower urinary tract infection) 07/08/2012  . Fall at home 07/07/2012  . Fracture, sternum closed 07/07/2012  . Hypokalemia 07/07/2012     Assessment/Plan: Fall Multiple right rib fractures-pulm toilet(243ml), requires 02 per Okay.  Scheduled tylenol, change robaxin to PO and reduce dose, reduce oxy dose given her age and drowsiness. DC xanax, would recommend holding at discharge given her age and multiple falls.   VTE - SCD's, heparin FEN - no issues.  SLIV.   MMP-home meds reviewed, resume zoloft. Dispo -- likely SNF(lives independently at [facility] home), OT eval and PT re-eval.     Ashok Norris, ANP-BC Pager: [phone] General Trauma PA Pager: [phone]   03/30/2015 8:41 AM

## 2015-03-30 NOTE — Evaluation (Signed)
Occupational Therapy Evaluation and Discharge Patient Details Name: Loretta Garcia MRN: [id number] DOB: [birth year] Today's Date: 03/30/2015    History of Present Illness 81 y/o with medical history as listed below, got up to bathroom and fell in bathroom hitting her head and chest.  pt sustained fx's to R 3rd thru 8th ribs.   Clinical Impression   This 80 yo female admitted with above presents to acute OT with decreased balance, decreased mobility, increased pain all affecting her PLOF of independent with BADLs. Recommend further OT at SNF, we will sign off.    Follow Up Recommendations  SNF    Equipment Recommendations   (TBD at SNF)       Precautions / Restrictions Precautions Precautions: Fall Precaution Comments: right side rib fractures Restrictions Weight Bearing Restrictions: No      Mobility Bed Mobility Overal bed mobility: Needs Assistance Bed Mobility: Supine to Sit     Supine to sit: Mod assist;HOB elevated        Transfers Overall transfer level: Needs assistance Equipment used: 1 person hand held assist Transfers: Sit to/from [employer] Sit to Stand: Mod assist Stand pivot transfers: Mod assist                 ADL Overall ADL's : Needs assistance/impaired Eating/Feeding: Set up;Supervision/ safety;Sitting   Grooming: Moderate assistance;Sitting   Upper Body Bathing: Moderate assistance;Sitting   Lower Body Bathing: Maximal assistance (with min A sit<>stand)   Upper Body Dressing : Maximal assistance;Sitting   Lower Body Dressing: Total assistance (with min A sit<>stand)   Toilet Transfer: Moderate assistance;Stand-pivot (bed>recliner)   Toileting- Clothing Manipulation and Hygiene: Total assistance (with min A sit<>stand)               Vision Additional Comments: No change from baseline          Pertinent Vitals/Pain Pain Assessment: Faces Faces Pain Scale: Hurts even more Pain Location: right rib  area Pain Descriptors / Indicators: Aching;Sore Pain Intervention(s): Limited activity within patient's tolerance;Monitored during session;Repositioned     Hand Dominance Right   Extremity/Trunk Assessment Upper Extremity Assessment Upper Extremity Assessment: Overall WFL for tasks assessed (only limted on right side due to pain from rib fractures)              Cognition Arousal/Alertness: Awake/alert Behavior During Therapy: WFL for tasks assessed/performed Overall Cognitive Status: Impaired/Different from baseline Area of Impairment: Following commands;Problem solving       Following Commands: Follows one step commands inconsistently;Follows one step commands with increased time     Problem Solving: Slow processing;Difficulty sequencing;Requires verbal cues;Requires tactile cues                Home Living Family/patient expects to be discharged to:: Skilled nursing facility                                             OT Diagnosis: Generalized weakness;Cognitive deficits;Acute pain   OT Problem List: Decreased strength;Decreased range of motion;Decreased activity tolerance;Impaired balance (sitting and/or standing);Pain;Decreased cognition;Decreased knowledge of use of DME or AE      OT Goals(Current goals can be found in the care plan section) Acute Rehab OT Goals Patient Stated Goal: back to Independent living  OT Frequency:                End of Session Nurse  Communication: Mobility status (chair alarm on)  Activity Tolerance: Patient limited by pain Patient left: in chair;with call bell/phone within reach;with chair alarm set   Time: 1500-1525 OT Time Calculation (min): 25 min Charges:  OT General Charges $OT Visit: 1 Procedure OT Evaluation $Initial OT Evaluation Tier I: 1 Procedure OT Treatments $Self Care/Home Management : 8-22 mins  Evette Georges [phone] 03/30/2015, 4:51 PM

## 2015-03-31 DIAGNOSIS — E876 Hypokalemia: Secondary | ICD-10-CM | POA: Diagnosis not present

## 2015-03-31 DIAGNOSIS — G8929 Other chronic pain: Secondary | ICD-10-CM | POA: Diagnosis not present

## 2015-03-31 DIAGNOSIS — W19XXXD Unspecified fall, subsequent encounter: Secondary | ICD-10-CM | POA: Diagnosis not present

## 2015-03-31 DIAGNOSIS — R488 Other symbolic dysfunctions: Secondary | ICD-10-CM | POA: Diagnosis not present

## 2015-03-31 DIAGNOSIS — K5900 Constipation, unspecified: Secondary | ICD-10-CM | POA: Diagnosis not present

## 2015-03-31 DIAGNOSIS — F0150 Vascular dementia without behavioral disturbance: Secondary | ICD-10-CM | POA: Diagnosis not present

## 2015-03-31 DIAGNOSIS — F331 Major depressive disorder, recurrent, moderate: Secondary | ICD-10-CM | POA: Diagnosis not present

## 2015-03-31 DIAGNOSIS — Z9181 History of falling: Secondary | ICD-10-CM | POA: Diagnosis not present

## 2015-03-31 DIAGNOSIS — S2249XS Multiple fractures of ribs, unspecified side, sequela: Secondary | ICD-10-CM | POA: Diagnosis not present

## 2015-03-31 DIAGNOSIS — S2239XA Fracture of one rib, unspecified side, initial encounter for closed fracture: Secondary | ICD-10-CM | POA: Diagnosis not present

## 2015-03-31 DIAGNOSIS — R278 Other lack of coordination: Secondary | ICD-10-CM | POA: Diagnosis not present

## 2015-03-31 DIAGNOSIS — N390 Urinary tract infection, site not specified: Secondary | ICD-10-CM | POA: Diagnosis not present

## 2015-03-31 DIAGNOSIS — M62838 Other muscle spasm: Secondary | ICD-10-CM | POA: Diagnosis not present

## 2015-03-31 DIAGNOSIS — H33001 Unspecified retinal detachment with retinal break, right eye: Secondary | ICD-10-CM | POA: Diagnosis not present

## 2015-03-31 DIAGNOSIS — D519 Vitamin B12 deficiency anemia, unspecified: Secondary | ICD-10-CM | POA: Diagnosis not present

## 2015-03-31 DIAGNOSIS — S2249XA Multiple fractures of ribs, unspecified side, initial encounter for closed fracture: Secondary | ICD-10-CM | POA: Diagnosis not present

## 2015-03-31 DIAGNOSIS — F329 Major depressive disorder, single episode, unspecified: Secondary | ICD-10-CM | POA: Diagnosis not present

## 2015-03-31 DIAGNOSIS — M6281 Muscle weakness (generalized): Secondary | ICD-10-CM | POA: Diagnosis not present

## 2015-03-31 DIAGNOSIS — M810 Age-related osteoporosis without current pathological fracture: Secondary | ICD-10-CM | POA: Diagnosis not present

## 2015-03-31 DIAGNOSIS — M150 Primary generalized (osteo)arthritis: Secondary | ICD-10-CM | POA: Diagnosis not present

## 2015-03-31 DIAGNOSIS — K219 Gastro-esophageal reflux disease without esophagitis: Secondary | ICD-10-CM | POA: Diagnosis not present

## 2015-03-31 DIAGNOSIS — F419 Anxiety disorder, unspecified: Secondary | ICD-10-CM | POA: Diagnosis not present

## 2015-03-31 DIAGNOSIS — I10 Essential (primary) hypertension: Secondary | ICD-10-CM | POA: Diagnosis not present

## 2015-03-31 DIAGNOSIS — S2241XD Multiple fractures of ribs, right side, subsequent encounter for fracture with routine healing: Secondary | ICD-10-CM | POA: Diagnosis not present

## 2015-03-31 DIAGNOSIS — R2689 Other abnormalities of gait and mobility: Secondary | ICD-10-CM | POA: Diagnosis not present

## 2015-03-31 DIAGNOSIS — J690 Pneumonitis due to inhalation of food and vomit: Secondary | ICD-10-CM | POA: Diagnosis not present

## 2015-03-31 DIAGNOSIS — G4700 Insomnia, unspecified: Secondary | ICD-10-CM | POA: Diagnosis not present

## 2015-03-31 DIAGNOSIS — S2249XD Multiple fractures of ribs, unspecified side, subsequent encounter for fracture with routine healing: Secondary | ICD-10-CM | POA: Diagnosis not present

## 2015-03-31 MED ORDER — ACETAMINOPHEN 325 MG PO TABS: 650.0000 mg | ORAL_TABLET | ORAL | Status: DC | PRN

## 2015-03-31 MED ORDER — ACETAMINOPHEN 500 MG PO TABS: 500.0000 mg | ORAL_TABLET | Freq: Four times a day (QID) | ORAL | Status: DC | PRN

## 2015-03-31 MED ORDER — ALPRAZOLAM 0.25 MG PO TABS: 0.2500 mg | ORAL_TABLET | Freq: Three times a day (TID) | ORAL | Status: AC | PRN

## 2015-03-31 MED ORDER — HYDROMORPHONE HCL 2 MG PO TABS: 2.0000 mg | ORAL_TABLET | ORAL | Status: DC | PRN

## 2015-03-31 NOTE — Progress Notes (Signed)
Patient ID: Loretta Garcia, female   DOB: 10-Jul-1920, 81 y.o.   MRN: [id number]    Subjective: R rib pain  Objective: Vital signs in last 24 hours: Temp:  [97.7 F (36.5 C)-99.9 F (37.7 C)] 99.9 F (37.7 C) (06/21 0733) Pulse Rate:  [65-70] 67 (06/21 0733) Resp:  [12-26] 15 (06/21 0733) BP: (118-157)/(51-57) 157/55 mmHg (06/21 0733) SpO2:  [96 %-98 %] 97 % (06/21 0733)    Intake/Output from previous day: 06/20 0701 - 06/21 0700 In: 1100 [P.O.:600; I.V.:500] Out: 500 [Urine:500] Intake/Output this shift:    General appearance: alert and cooperative Resp: clear to auscultation bilaterally and moderate effort Chest wall: right sided chest wall tenderness Cardio: regular rate and rhythm GI: soft, Mild distention, NT  Lab Results: CBC   Recent Labs  03/29/15 0230  WBC 7.5  HGB 12.0  HCT 36.4  PLT 175   BMET  Recent Labs  03/29/15 0230  NA 139  K 3.6  CL 104  CO2 27  GLUCOSE 122*  BUN 13  CREATININE 0.73  CALCIUM 9.1   PT/INR No results for input(s): LABPROT, INR in the last 72 hours. ABG No results for input(s): PHART, HCO3 in the last 72 hours.  Invalid input(s): PCO2, PO2  Studies/Results: No results found.  Anti-infectives: Anti-infectives    None      Assessment/Plan: Fall Multiple right rib fractures- continue pulmonary toilet, pain meds. Home tylenol dose adjusted VTE - SCD's, heparin FEN - no issues.  SLIV.   MMP-home meds reviewed, resume zoloft. Dispo -- floor, therapies rec SNF   LOS: 3 days    Violeta Gelinas, MD, MPH, FACS Trauma: [phone] General Surgery: [phone]  03/31/2015

## 2015-03-31 NOTE — Clinical Social Work Note (Signed)
Per MD patient ready to DC back to [facility]. RN, patient/family, and facility notified of patient's DC. RN given number for report. DC packet on patient's chart. Ambulance transport requested for patient for 1:30PM. CSW signing off at this time.   Roddie Mc MSW, [place], [place], [phone]

## 2015-03-31 NOTE — Care Management Note (Signed)
Case Management Note  Patient Details  Name: Loretta Garcia MRN: [id number] Date of Birth: September 03, 1920  Subjective/Objective:       Pt admitted on 03/28/15 s/p fall with multiple rib fractures.  PTA, pt resided at [facility].               Action/Plan: PT/OT recommending SNF level at dc.  CSW consulted to facilitate dc to SNF when medically stable.    Expected Discharge Date:                  Expected Discharge Plan:  Skilled Nursing Facility  In-House Referral:  Clinical Social Work  Discharge planning Services  CM Consult  Post Acute Care Choice:    Choice offered to:     DME Arranged:    DME Agency:     HH Arranged:    HH Agency:     Status of Service:  Completed, signed off  Medicare Important Message Given:  Yes Date Medicare IM Given:  03/31/15 Medicare IM give by:  Sidney Ace, RN, BSN  Date Additional Medicare IM Given:    Additional Medicare Important Message give by:     If discussed at Long Length of Stay Meetings, dates discussed:    Additional Comments: Pt discharging to SNF today, per CSW arrangements.   Quintella Baton, RN, BSN  Trauma/Neuro ICU Case Manager [phone]

## 2015-03-31 NOTE — Clinical Social Work Note (Signed)
Clinical Social Work Assessment  Patient Details  Name: Loretta Garcia MRN: [id number] Date of Birth: 1919-12-03  Date of referral:  03/31/15               Reason for consult:  Facility Placement, Discharge Planning                Permission sought to share information with:  Family Supports, Customer service manager Permission granted to share information::  Yes, Verbal Permission Granted  Name::     Audrie Kuri  Agency::  [employer]  Relationship::     Contact Information:     Housing/Transportation Living arrangements for the past 2 months:  [place] of Information:  Patient, Adult Children Patient Interpreter Needed:  None Criminal Activity/Legal Involvement Pertinent to Current Situation/Hospitalization:  No - Comment as needed Significant Relationships:  Adult Children Lives with:  Facility Resident Do you feel safe going back to the place where you live?  Yes Need for family participation in patient care:  No (Coment)  Care giving concerns:  Patient and patient's daughter Mcgroarty agree that the patient needs to DC to [facility] SNF once medically cleared for DC.   Social Worker assessment / plan:  CSW met with patient at bedside and spoke with daughter Xitlaly Ault by phone. Both confirm plan for return to [facility] SNF once medically stablle. Patient presents with flat affect and minimal engagement with CSW. CSW explained role to patient and patient's daughter and answered their questions. Patient denies any alcohol or substance use, and denies any symptoms of acute stress reaction.    Employment status:  Retired Forensic scientist:  Medicare PT Recommendations:  [place] / Referral to community resources:  [place]  Patient/Family's Response to care:  Patient and patient's family are happy with the care the patient has received.  Patient/Family's Understanding of and Emotional Response to  Diagnosis, Current Treatment, and Prognosis:  Patient's daughter Hesser has good understanding of reason for admission and what her post DC needs will be.   Emotional Assessment Appearance:  Appears stated age Attitude/Demeanor/Rapport:  Other (Withdrawn) Affect (typically observed):  Flat, Withdrawn, Sad Orientation:  Oriented to Self, Oriented to Place Alcohol / Substance use:  Never Used Psych involvement (Current and /or in the community):  No (Comment)  Discharge Needs  Concerns to be addressed:  Discharge Planning Concerns Readmission within the last 30 days:  No Current discharge risk:  Physical Impairment, Cognitively Impaired Barriers to Discharge:  No Barriers Identified   Rigoberto Noel, LCSW 03/31/2015, 12:02 PM

## 2015-03-31 NOTE — Discharge Summary (Signed)
Physician Discharge Summary  Patient ID: Loretta Garcia MRN: [id number] DOB/AGE: 10/14/1919 81 y.o.  Admit date: 03/28/2015 Discharge date: 03/31/2015  Discharge Diagnoses Patient Active Problem List   Diagnosis Date Noted  . Multiple rib fractures 03/28/2015  . Aspiration pneumonia 01/23/2014  . Nonspecific (abnormal) findings on radiological and other examination of gastrointestinal tract 01/23/2014  . Other dysphagia 01/23/2014  . Postoperative anemia due to acute blood loss 01/21/2014  . Unspecified essential hypertension 01/21/2014  . GERD (gastroesophageal reflux disease) 01/21/2014  . Hypopotassemia 01/21/2014  . Dislocation of hip joint prosthesis 01/20/2014  . UTI (lower urinary tract infection) 07/08/2012  . Fall at home 07/07/2012  . Fracture, sternum closed 07/07/2012  . Hypokalemia 07/07/2012    Consultants None   Procedures None   HPI: Alahia got up to the bathroom and fell in the bathroom hitting her head and chest. She denied loss of consciousness. She said she just fell. A daughter said her legs give out sometimes. Her workup included CT scans of the chest, abdomen, and pelvis that showed her rib fractures. She was admitted by the trauma service.   Hospital Course: The patient did not suffer any respiratory compromise from her rib fractures. Her pain was controlled on oral medications. She was evaluated by physical and occupational therapies who recommended skilled nursing facility placement. One was located and she was transferred there in good condition.     Medication List    TAKE these medications        acetaminophen 325 MG tablet  Commonly known as:  TYLENOL  Take 2 tablets (650 mg total) by mouth every 4 (four) hours as needed for mild pain.     ALPRAZolam 0.25 MG tablet  Commonly known as:  XANAX  Take 1 tablet (0.25 mg total) by mouth 3 (three) times daily as needed for anxiety.     amLODipine 2.5 MG tablet  Commonly known as:  NORVASC   Take 1 tablet (2.5 mg total) by mouth daily.     aspirin 325 MG EC tablet  Take 1 tablet (325 mg total) by mouth daily with breakfast. Take Aspirin  for two and a half more weeks, then discontinue Full Dose Aspirin. Once the patient has completed the Full Dose 325 mg Aspirin, they may resume the 81 mg Aspirin.     bisacodyl 10 MG suppository  Commonly known as:  DULCOLAX  Place 1 suppository (10 mg total) rectally daily as needed for moderate constipation.     DSS 100 MG Caps  Take 100 mg by mouth 2 (two) times daily.     HYDROmorphone 2 MG tablet  Commonly known as:  DILAUDID  Take 1 tablet (2 mg total) by mouth every 4 (four) hours as needed for severe pain.     indapamide 1.25 MG tablet  Commonly known as:  LOZOL  Take 1.25 mg by mouth every morning.     levofloxacin 750 MG tablet  Commonly known as:  LEVAQUIN  Take 1 tablet (750 mg total) by mouth every other day. Take for two more doses and then discontinue Levaquin.     Melatonin 5 MG Tabs  Take 5 mg by mouth at bedtime as needed (Sleep).     methocarbamol 500 MG tablet  Commonly known as:  ROBAXIN  Take 1 tablet (500 mg total) by mouth every 6 (six) hours as needed for muscle spasms.     metoCLOPramide 5 MG tablet  Commonly known as:  REGLAN  Take 1-2  tablets (5-10 mg total) by mouth every 8 (eight) hours as needed for nausea (if ondansetron (ZOFRAN) ineffective.).     multivitamin tablet  Take 1 tablet by mouth daily.     ofloxacin 0.3 % ophthalmic solution  Commonly known as:  OCUFLOX  Place 1 drop into the right eye 2 (two) times daily.     ondansetron 4 MG tablet  Commonly known as:  ZOFRAN  Take 1 tablet (4 mg total) by mouth every 6 (six) hours as needed for nausea.     pantoprazole 40 MG tablet  Commonly known as:  PROTONIX  Take 1 tablet (40 mg total) by mouth daily.     polyethylene glycol packet  Commonly known as:  MIRALAX / GLYCOLAX  Take 17 g by mouth every other day.     sertraline 50  MG tablet  Commonly known as:  ZOLOFT  Take 50 mg by mouth every morning.     vitamin B-12 100 MCG tablet  Commonly known as:  CYANOCOBALAMIN  Take 100 mcg by mouth daily.            Follow-up Information    Schedule an appointment as soon as possible for a visit with [name], MD.   Specialty:  Internal Medicine   Contact information:   [address] [place] [address] [place] [address] [phone]       Call [facility].   Why:  As needed   Contact information:   [address] [place] [address] [phone]       Signed: Freeman Caldron, PA-C Pager: [phone] General Trauma PA Pager: [phone] 03/31/2015, 10:09 AM

## 2015-04-01 DIAGNOSIS — E876 Hypokalemia: Secondary | ICD-10-CM | POA: Diagnosis not present

## 2015-04-01 DIAGNOSIS — S2241XD Multiple fractures of ribs, right side, subsequent encounter for fracture with routine healing: Secondary | ICD-10-CM | POA: Diagnosis not present

## 2015-04-01 DIAGNOSIS — I10 Essential (primary) hypertension: Secondary | ICD-10-CM | POA: Diagnosis not present

## 2015-04-09 DIAGNOSIS — F331 Major depressive disorder, recurrent, moderate: Secondary | ICD-10-CM | POA: Diagnosis not present

## 2015-04-15 DIAGNOSIS — F329 Major depressive disorder, single episode, unspecified: Secondary | ICD-10-CM | POA: Diagnosis not present

## 2015-04-15 DIAGNOSIS — I10 Essential (primary) hypertension: Secondary | ICD-10-CM | POA: Diagnosis not present

## 2015-04-15 DIAGNOSIS — S2241XD Multiple fractures of ribs, right side, subsequent encounter for fracture with routine healing: Secondary | ICD-10-CM | POA: Diagnosis not present

## 2015-05-12 DIAGNOSIS — S2249XS Multiple fractures of ribs, unspecified side, sequela: Secondary | ICD-10-CM | POA: Diagnosis not present

## 2015-05-12 DIAGNOSIS — R488 Other symbolic dysfunctions: Secondary | ICD-10-CM | POA: Diagnosis not present

## 2015-05-12 DIAGNOSIS — W19XXXD Unspecified fall, subsequent encounter: Secondary | ICD-10-CM | POA: Diagnosis not present

## 2015-05-12 DIAGNOSIS — F0150 Vascular dementia without behavioral disturbance: Secondary | ICD-10-CM | POA: Diagnosis not present

## 2015-05-12 DIAGNOSIS — M6281 Muscle weakness (generalized): Secondary | ICD-10-CM | POA: Diagnosis not present

## 2015-05-12 DIAGNOSIS — R2689 Other abnormalities of gait and mobility: Secondary | ICD-10-CM | POA: Diagnosis not present

## 2015-05-13 DIAGNOSIS — F0150 Vascular dementia without behavioral disturbance: Secondary | ICD-10-CM | POA: Diagnosis not present

## 2015-05-13 DIAGNOSIS — R488 Other symbolic dysfunctions: Secondary | ICD-10-CM | POA: Diagnosis not present

## 2015-05-13 DIAGNOSIS — W19XXXD Unspecified fall, subsequent encounter: Secondary | ICD-10-CM | POA: Diagnosis not present

## 2015-05-13 DIAGNOSIS — S2249XS Multiple fractures of ribs, unspecified side, sequela: Secondary | ICD-10-CM | POA: Diagnosis not present

## 2015-05-13 DIAGNOSIS — M6281 Muscle weakness (generalized): Secondary | ICD-10-CM | POA: Diagnosis not present

## 2015-05-13 DIAGNOSIS — R2689 Other abnormalities of gait and mobility: Secondary | ICD-10-CM | POA: Diagnosis not present

## 2015-05-14 DIAGNOSIS — M6281 Muscle weakness (generalized): Secondary | ICD-10-CM | POA: Diagnosis not present

## 2015-05-14 DIAGNOSIS — F0150 Vascular dementia without behavioral disturbance: Secondary | ICD-10-CM | POA: Diagnosis not present

## 2015-05-14 DIAGNOSIS — R488 Other symbolic dysfunctions: Secondary | ICD-10-CM | POA: Diagnosis not present

## 2015-05-14 DIAGNOSIS — S2249XS Multiple fractures of ribs, unspecified side, sequela: Secondary | ICD-10-CM | POA: Diagnosis not present

## 2015-05-14 DIAGNOSIS — W19XXXD Unspecified fall, subsequent encounter: Secondary | ICD-10-CM | POA: Diagnosis not present

## 2015-05-14 DIAGNOSIS — R2689 Other abnormalities of gait and mobility: Secondary | ICD-10-CM | POA: Diagnosis not present

## 2015-05-15 DIAGNOSIS — F0150 Vascular dementia without behavioral disturbance: Secondary | ICD-10-CM | POA: Diagnosis not present

## 2015-05-15 DIAGNOSIS — W19XXXD Unspecified fall, subsequent encounter: Secondary | ICD-10-CM | POA: Diagnosis not present

## 2015-05-15 DIAGNOSIS — R2689 Other abnormalities of gait and mobility: Secondary | ICD-10-CM | POA: Diagnosis not present

## 2015-05-15 DIAGNOSIS — R488 Other symbolic dysfunctions: Secondary | ICD-10-CM | POA: Diagnosis not present

## 2015-05-15 DIAGNOSIS — S2249XS Multiple fractures of ribs, unspecified side, sequela: Secondary | ICD-10-CM | POA: Diagnosis not present

## 2015-05-15 DIAGNOSIS — M6281 Muscle weakness (generalized): Secondary | ICD-10-CM | POA: Diagnosis not present

## 2015-05-18 DIAGNOSIS — W19XXXD Unspecified fall, subsequent encounter: Secondary | ICD-10-CM | POA: Diagnosis not present

## 2015-05-18 DIAGNOSIS — R2689 Other abnormalities of gait and mobility: Secondary | ICD-10-CM | POA: Diagnosis not present

## 2015-05-18 DIAGNOSIS — R488 Other symbolic dysfunctions: Secondary | ICD-10-CM | POA: Diagnosis not present

## 2015-05-18 DIAGNOSIS — F0150 Vascular dementia without behavioral disturbance: Secondary | ICD-10-CM | POA: Diagnosis not present

## 2015-05-18 DIAGNOSIS — S2249XS Multiple fractures of ribs, unspecified side, sequela: Secondary | ICD-10-CM | POA: Diagnosis not present

## 2015-05-18 DIAGNOSIS — M6281 Muscle weakness (generalized): Secondary | ICD-10-CM | POA: Diagnosis not present

## 2015-05-19 DIAGNOSIS — R488 Other symbolic dysfunctions: Secondary | ICD-10-CM | POA: Diagnosis not present

## 2015-05-19 DIAGNOSIS — M6281 Muscle weakness (generalized): Secondary | ICD-10-CM | POA: Diagnosis not present

## 2015-05-19 DIAGNOSIS — F0150 Vascular dementia without behavioral disturbance: Secondary | ICD-10-CM | POA: Diagnosis not present

## 2015-05-19 DIAGNOSIS — W19XXXD Unspecified fall, subsequent encounter: Secondary | ICD-10-CM | POA: Diagnosis not present

## 2015-05-19 DIAGNOSIS — S2249XS Multiple fractures of ribs, unspecified side, sequela: Secondary | ICD-10-CM | POA: Diagnosis not present

## 2015-05-19 DIAGNOSIS — R2689 Other abnormalities of gait and mobility: Secondary | ICD-10-CM | POA: Diagnosis not present

## 2015-05-21 DIAGNOSIS — W19XXXD Unspecified fall, subsequent encounter: Secondary | ICD-10-CM | POA: Diagnosis not present

## 2015-05-21 DIAGNOSIS — S2249XS Multiple fractures of ribs, unspecified side, sequela: Secondary | ICD-10-CM | POA: Diagnosis not present

## 2015-05-21 DIAGNOSIS — F0150 Vascular dementia without behavioral disturbance: Secondary | ICD-10-CM | POA: Diagnosis not present

## 2015-05-21 DIAGNOSIS — R2689 Other abnormalities of gait and mobility: Secondary | ICD-10-CM | POA: Diagnosis not present

## 2015-05-21 DIAGNOSIS — R488 Other symbolic dysfunctions: Secondary | ICD-10-CM | POA: Diagnosis not present

## 2015-05-21 DIAGNOSIS — M6281 Muscle weakness (generalized): Secondary | ICD-10-CM | POA: Diagnosis not present

## 2015-05-22 DIAGNOSIS — R2689 Other abnormalities of gait and mobility: Secondary | ICD-10-CM | POA: Diagnosis not present

## 2015-05-22 DIAGNOSIS — S2249XS Multiple fractures of ribs, unspecified side, sequela: Secondary | ICD-10-CM | POA: Diagnosis not present

## 2015-05-22 DIAGNOSIS — F0150 Vascular dementia without behavioral disturbance: Secondary | ICD-10-CM | POA: Diagnosis not present

## 2015-05-22 DIAGNOSIS — W19XXXD Unspecified fall, subsequent encounter: Secondary | ICD-10-CM | POA: Diagnosis not present

## 2015-05-22 DIAGNOSIS — M6281 Muscle weakness (generalized): Secondary | ICD-10-CM | POA: Diagnosis not present

## 2015-05-22 DIAGNOSIS — R488 Other symbolic dysfunctions: Secondary | ICD-10-CM | POA: Diagnosis not present

## 2015-05-25 DIAGNOSIS — W19XXXD Unspecified fall, subsequent encounter: Secondary | ICD-10-CM | POA: Diagnosis not present

## 2015-05-25 DIAGNOSIS — M6281 Muscle weakness (generalized): Secondary | ICD-10-CM | POA: Diagnosis not present

## 2015-05-25 DIAGNOSIS — N390 Urinary tract infection, site not specified: Secondary | ICD-10-CM | POA: Diagnosis not present

## 2015-05-25 DIAGNOSIS — R488 Other symbolic dysfunctions: Secondary | ICD-10-CM | POA: Diagnosis not present

## 2015-05-25 DIAGNOSIS — R2689 Other abnormalities of gait and mobility: Secondary | ICD-10-CM | POA: Diagnosis not present

## 2015-05-25 DIAGNOSIS — S2249XS Multiple fractures of ribs, unspecified side, sequela: Secondary | ICD-10-CM | POA: Diagnosis not present

## 2015-05-25 DIAGNOSIS — F0150 Vascular dementia without behavioral disturbance: Secondary | ICD-10-CM | POA: Diagnosis not present

## 2015-06-18 DIAGNOSIS — Z23 Encounter for immunization: Secondary | ICD-10-CM | POA: Diagnosis not present

## 2015-06-18 DIAGNOSIS — F325 Major depressive disorder, single episode, in full remission: Secondary | ICD-10-CM | POA: Diagnosis not present

## 2015-06-18 DIAGNOSIS — Z79899 Other long term (current) drug therapy: Secondary | ICD-10-CM | POA: Diagnosis not present

## 2015-06-18 DIAGNOSIS — I10 Essential (primary) hypertension: Secondary | ICD-10-CM | POA: Diagnosis not present

## 2015-06-18 DIAGNOSIS — E780 Pure hypercholesterolemia: Secondary | ICD-10-CM | POA: Diagnosis not present

## 2015-06-18 DIAGNOSIS — K219 Gastro-esophageal reflux disease without esophagitis: Secondary | ICD-10-CM | POA: Diagnosis not present

## 2015-06-18 DIAGNOSIS — Z0000 Encounter for general adult medical examination without abnormal findings: Secondary | ICD-10-CM | POA: Diagnosis not present

## 2015-06-18 DIAGNOSIS — Z1389 Encounter for screening for other disorder: Secondary | ICD-10-CM | POA: Diagnosis not present

## 2015-07-09 DIAGNOSIS — I10 Essential (primary) hypertension: Secondary | ICD-10-CM | POA: Diagnosis not present

## 2015-07-09 DIAGNOSIS — N952 Postmenopausal atrophic vaginitis: Secondary | ICD-10-CM | POA: Diagnosis not present

## 2015-10-01 DIAGNOSIS — H33021 Retinal detachment with multiple breaks, right eye: Secondary | ICD-10-CM | POA: Diagnosis not present

## 2015-10-01 DIAGNOSIS — H5989 Other postprocedural complications and disorders of eye and adnexa, not elsewhere classified: Secondary | ICD-10-CM | POA: Diagnosis not present

## 2015-12-17 DIAGNOSIS — I10 Essential (primary) hypertension: Secondary | ICD-10-CM | POA: Diagnosis not present

## 2015-12-17 DIAGNOSIS — F325 Major depressive disorder, single episode, in full remission: Secondary | ICD-10-CM | POA: Diagnosis not present

## 2016-04-10 ENCOUNTER — Emergency Department (HOSPITAL_COMMUNITY): Payer: Medicare Other

## 2016-04-10 ENCOUNTER — Encounter (HOSPITAL_COMMUNITY): Payer: Self-pay

## 2016-04-10 ENCOUNTER — Observation Stay (HOSPITAL_COMMUNITY): Payer: Medicare Other

## 2016-04-10 ENCOUNTER — Inpatient Hospital Stay (HOSPITAL_COMMUNITY)
Admission: EM | Admit: 2016-04-10 | Discharge: 2016-04-13 | DRG: 565 | Disposition: A | Discharge status: Skilled Nursing Facility | Payer: Medicare Other | Attending: Internal Medicine | Admitting: Internal Medicine

## 2016-04-10 DIAGNOSIS — I10 Essential (primary) hypertension: Secondary | ICD-10-CM | POA: Diagnosis not present

## 2016-04-10 DIAGNOSIS — L89310 Pressure ulcer of right buttock, unstageable: Secondary | ICD-10-CM | POA: Diagnosis not present

## 2016-04-10 DIAGNOSIS — W1830XA Fall on same level, unspecified, initial encounter: Secondary | ICD-10-CM | POA: Diagnosis present

## 2016-04-10 DIAGNOSIS — D72829 Elevated white blood cell count, unspecified: Secondary | ICD-10-CM

## 2016-04-10 DIAGNOSIS — M6282 Rhabdomyolysis: Secondary | ICD-10-CM | POA: Diagnosis present

## 2016-04-10 DIAGNOSIS — I959 Hypotension, unspecified: Secondary | ICD-10-CM | POA: Diagnosis not present

## 2016-04-10 DIAGNOSIS — Y92009 Unspecified place in unspecified non-institutional (private) residence as the place of occurrence of the external cause: Secondary | ICD-10-CM

## 2016-04-10 DIAGNOSIS — W19XXXA Unspecified fall, initial encounter: Secondary | ICD-10-CM

## 2016-04-10 DIAGNOSIS — M25551 Pain in right hip: Secondary | ICD-10-CM | POA: Diagnosis not present

## 2016-04-10 DIAGNOSIS — Z96643 Presence of artificial hip joint, bilateral: Secondary | ICD-10-CM | POA: Diagnosis present

## 2016-04-10 DIAGNOSIS — S300XXA Contusion of lower back and pelvis, initial encounter: Secondary | ICD-10-CM | POA: Diagnosis not present

## 2016-04-10 DIAGNOSIS — L8990 Pressure ulcer of unspecified site, unspecified stage: Secondary | ICD-10-CM | POA: Insufficient documentation

## 2016-04-10 DIAGNOSIS — Z7982 Long term (current) use of aspirin: Secondary | ICD-10-CM

## 2016-04-10 DIAGNOSIS — S3992XA Unspecified injury of lower back, initial encounter: Secondary | ICD-10-CM | POA: Diagnosis not present

## 2016-04-10 DIAGNOSIS — Z880 Allergy status to penicillin: Secondary | ICD-10-CM

## 2016-04-10 DIAGNOSIS — Z79899 Other long term (current) drug therapy: Secondary | ICD-10-CM

## 2016-04-10 DIAGNOSIS — N179 Acute kidney failure, unspecified: Secondary | ICD-10-CM

## 2016-04-10 DIAGNOSIS — Y92129 Unspecified place in nursing home as the place of occurrence of the external cause: Secondary | ICD-10-CM

## 2016-04-10 DIAGNOSIS — Z471 Aftercare following joint replacement surgery: Secondary | ICD-10-CM | POA: Diagnosis not present

## 2016-04-10 DIAGNOSIS — D649 Anemia, unspecified: Secondary | ICD-10-CM | POA: Diagnosis not present

## 2016-04-10 DIAGNOSIS — K219 Gastro-esophageal reflux disease without esophagitis: Secondary | ICD-10-CM | POA: Diagnosis present

## 2016-04-10 DIAGNOSIS — R296 Repeated falls: Secondary | ICD-10-CM

## 2016-04-10 DIAGNOSIS — Z8673 Personal history of transient ischemic attack (TIA), and cerebral infarction without residual deficits: Secondary | ICD-10-CM

## 2016-04-10 DIAGNOSIS — F0390 Unspecified dementia without behavioral disturbance: Secondary | ICD-10-CM | POA: Diagnosis not present

## 2016-04-10 DIAGNOSIS — M545 Low back pain: Secondary | ICD-10-CM | POA: Diagnosis not present

## 2016-04-10 DIAGNOSIS — T796XXA Traumatic ischemia of muscle, initial encounter: Principal | ICD-10-CM | POA: Diagnosis present

## 2016-04-10 DIAGNOSIS — K529 Noninfective gastroenteritis and colitis, unspecified: Secondary | ICD-10-CM | POA: Diagnosis present

## 2016-04-10 DIAGNOSIS — T148 Other injury of unspecified body region: Secondary | ICD-10-CM | POA: Diagnosis not present

## 2016-04-10 DIAGNOSIS — F419 Anxiety disorder, unspecified: Secondary | ICD-10-CM | POA: Diagnosis present

## 2016-04-10 DIAGNOSIS — S0990XA Unspecified injury of head, initial encounter: Secondary | ICD-10-CM | POA: Diagnosis not present

## 2016-04-10 DIAGNOSIS — M542 Cervicalgia: Secondary | ICD-10-CM | POA: Diagnosis not present

## 2016-04-10 DIAGNOSIS — Z888 Allergy status to other drugs, medicaments and biological substances status: Secondary | ICD-10-CM

## 2016-04-10 DIAGNOSIS — Z66 Do not resuscitate: Secondary | ICD-10-CM | POA: Diagnosis present

## 2016-04-10 DIAGNOSIS — S199XXA Unspecified injury of neck, initial encounter: Secondary | ICD-10-CM | POA: Diagnosis not present

## 2016-04-10 DIAGNOSIS — S299XXA Unspecified injury of thorax, initial encounter: Secondary | ICD-10-CM | POA: Diagnosis not present

## 2016-04-10 DIAGNOSIS — M810 Age-related osteoporosis without current pathological fracture: Secondary | ICD-10-CM | POA: Diagnosis present

## 2016-04-10 DIAGNOSIS — M25559 Pain in unspecified hip: Secondary | ICD-10-CM | POA: Diagnosis not present

## 2016-04-10 DIAGNOSIS — Z885 Allergy status to narcotic agent status: Secondary | ICD-10-CM

## 2016-04-10 DIAGNOSIS — E876 Hypokalemia: Secondary | ICD-10-CM | POA: Diagnosis present

## 2016-04-10 DIAGNOSIS — K6289 Other specified diseases of anus and rectum: Secondary | ICD-10-CM | POA: Diagnosis not present

## 2016-04-10 LAB — CBC WITH DIFFERENTIAL/PLATELET
Basophils Absolute: 0 10*3/uL (ref 0.0–0.1)
Basophils Relative: 0 %
Eosinophils Absolute: 0 10*3/uL (ref 0.0–0.7)
Eosinophils Relative: 0 %
HEMATOCRIT: 39.8 % (ref 36.0–46.0)
HEMOGLOBIN: 14.1 g/dL (ref 12.0–15.0)
LYMPHS ABS: 1.1 10*3/uL (ref 0.7–4.0)
LYMPHS PCT: 5 %
MCH: 31.2 pg (ref 26.0–34.0)
MCHC: 35.4 g/dL (ref 30.0–36.0)
MCV: 88.1 fL (ref 78.0–100.0)
MONO ABS: 1.4 10*3/uL — AB (ref 0.1–1.0)
MONOS PCT: 7 %
NEUTROS ABS: 16.9 10*3/uL — AB (ref 1.7–7.7)
NEUTROS PCT: 88 %
Platelets: 254 10*3/uL (ref 150–400)
RBC: 4.52 MIL/uL (ref 3.87–5.11)
RDW: 13.2 % (ref 11.5–15.5)
WBC: 19.4 10*3/uL — ABNORMAL HIGH (ref 4.0–10.5)

## 2016-04-10 LAB — CK: Total CK: 1271 U/L — ABNORMAL HIGH (ref 38–234)

## 2016-04-10 LAB — I-STAT CHEM 8, ED
BUN: 42 mg/dL — AB (ref 6–20)
CALCIUM ION: 1.18 mmol/L (ref 1.12–1.23)
CREATININE: 1.8 mg/dL — AB (ref 0.44–1.00)
Chloride: 99 mmol/L — ABNORMAL LOW (ref 101–111)
GLUCOSE: 142 mg/dL — AB (ref 65–99)
HEMATOCRIT: 42 % (ref 36.0–46.0)
Hemoglobin: 14.3 g/dL (ref 12.0–15.0)
Potassium: 4 mmol/L (ref 3.5–5.1)
Sodium: 139 mmol/L (ref 135–145)
TCO2: 33 mmol/L (ref 0–100)

## 2016-04-10 LAB — URINALYSIS, ROUTINE W REFLEX MICROSCOPIC
BILIRUBIN URINE: NEGATIVE
GLUCOSE, UA: NEGATIVE mg/dL
Ketones, ur: NEGATIVE mg/dL
NITRITE: POSITIVE — AB
PH: 6.5 (ref 5.0–8.0)
Protein, ur: 30 mg/dL — AB
SPECIFIC GRAVITY, URINE: 1.018 (ref 1.005–1.030)

## 2016-04-10 LAB — URINE MICROSCOPIC-ADD ON

## 2016-04-10 LAB — CREATININE, SERUM
CREATININE: 1.88 mg/dL — AB (ref 0.44–1.00)
GFR calc Af Amer: 25 mL/min — ABNORMAL LOW (ref >60)
GFR calc non Af Amer: 22 mL/min — ABNORMAL LOW (ref >60)

## 2016-04-10 MED ORDER — ONDANSETRON HCL 4 MG/2ML IJ SOLN: 4.0000 mg | Freq: Four times a day (QID) | INTRAMUSCULAR | Status: DC | PRN

## 2016-04-10 MED ORDER — SERTRALINE HCL 50 MG PO TABS: 50.0000 mg | ORAL_TABLET | Freq: Every morning | ORAL | Status: DC

## 2016-04-10 MED ORDER — ASPIRIN EC 81 MG PO TBEC
81.0000 mg | DELAYED_RELEASE_TABLET | Freq: Every day | ORAL | Status: DC
  Administered 2016-04-10 – 2016-04-13 (×4): 81 mg via ORAL
  Filled 2016-04-10 (×4): qty 1

## 2016-04-10 MED ORDER — HEPARIN SODIUM (PORCINE) 5000 UNIT/ML IJ SOLN
5000.0000 [IU] | Freq: Three times a day (TID) | INTRAMUSCULAR | Status: DC
  Administered 2016-04-10 – 2016-04-13 (×9): 5000 [IU] via SUBCUTANEOUS
  Filled 2016-04-10 (×9): qty 1

## 2016-04-10 MED ORDER — INDAPAMIDE 1.25 MG PO TABS
1.2500 mg | ORAL_TABLET | Freq: Every morning | ORAL | Status: DC
  Administered 2016-04-10: 1.25 mg via ORAL
  Filled 2016-04-10 (×2): qty 1

## 2016-04-10 MED ORDER — ENSURE ENLIVE PO LIQD
237.0000 mL | Freq: Two times a day (BID) | ORAL | Status: DC
  Administered 2016-04-11 – 2016-04-13 (×5): 237 mL via ORAL

## 2016-04-10 MED ORDER — POLYETHYLENE GLYCOL 3350 17 G PO PACK
17.0000 g | PACK | ORAL | Status: DC
  Administered 2016-04-10: 17 g via ORAL
  Filled 2016-04-10 (×2): qty 1

## 2016-04-10 MED ORDER — OFLOXACIN 0.3 % OP SOLN
1.0000 [drp] | Freq: Two times a day (BID) | OPHTHALMIC | Status: DC
  Filled 2016-04-10: qty 5

## 2016-04-10 MED ORDER — SODIUM CHLORIDE 0.9% FLUSH
3.0000 mL | Freq: Two times a day (BID) | INTRAVENOUS | Status: DC
  Administered 2016-04-11 – 2016-04-13 (×3): 3 mL via INTRAVENOUS

## 2016-04-10 MED ORDER — ALPRAZOLAM 0.25 MG PO TABS
0.2500 mg | ORAL_TABLET | Freq: Three times a day (TID) | ORAL | Status: DC | PRN
  Administered 2016-04-11 – 2016-04-13 (×3): 0.25 mg via ORAL
  Filled 2016-04-10 (×3): qty 1

## 2016-04-10 MED ORDER — ONDANSETRON HCL 4 MG PO TABS: 4.0000 mg | ORAL_TABLET | Freq: Four times a day (QID) | ORAL | Status: DC | PRN

## 2016-04-10 MED ORDER — SODIUM CHLORIDE 0.9 % IV SOLN
INTRAVENOUS | Status: DC
  Administered 2016-04-10 – 2016-04-13 (×7): via INTRAVENOUS

## 2016-04-10 MED ORDER — SODIUM CHLORIDE 0.9 % IV BOLUS (SEPSIS)
1000.0000 mL | Freq: Once | INTRAVENOUS | Status: AC
  Administered 2016-04-10: 1000 mL via INTRAVENOUS

## 2016-04-10 MED ORDER — DOCUSATE SODIUM 100 MG PO CAPS
100.0000 mg | ORAL_CAPSULE | Freq: Two times a day (BID) | ORAL | Status: DC
  Administered 2016-04-10 – 2016-04-12 (×3): 100 mg via ORAL
  Filled 2016-04-10 (×5): qty 1

## 2016-04-10 MED ORDER — SERTRALINE HCL 50 MG PO TABS
25.0000 mg | ORAL_TABLET | Freq: Every day | ORAL | Status: DC
  Administered 2016-04-10 – 2016-04-13 (×4): 25 mg via ORAL
  Filled 2016-04-10 (×4): qty 1

## 2016-04-10 MED ORDER — PANTOPRAZOLE SODIUM 40 MG PO TBEC
40.0000 mg | DELAYED_RELEASE_TABLET | Freq: Every day | ORAL | Status: DC
  Administered 2016-04-10: 40 mg via ORAL
  Filled 2016-04-10: qty 1

## 2016-04-10 MED ORDER — FENTANYL CITRATE (PF) 100 MCG/2ML IJ SOLN
12.5000 ug | Freq: Once | INTRAMUSCULAR | Status: AC
  Administered 2016-04-10: 12.5 ug via INTRAVENOUS
  Filled 2016-04-10: qty 2

## 2016-04-10 MED ORDER — ACETAMINOPHEN 500 MG PO TABS
1000.0000 mg | ORAL_TABLET | Freq: Four times a day (QID) | ORAL | Status: DC | PRN
  Administered 2016-04-10 – 2016-04-11 (×3): 1000 mg via ORAL
  Filled 2016-04-10 (×3): qty 2

## 2016-04-10 NOTE — ED Notes (Signed)
RN at bedside collecting labs 

## 2016-04-10 NOTE — ED Notes (Signed)
Nancy/daughter in [place] [phone]

## 2016-04-10 NOTE — ED Notes (Signed)
Per EMS- patient is a resident of [name]. Staff found the patient lying on the floor. Patient does not when she fell. Patient has a history of dementia. Patient c/o right hip, neck pain, and lowe back pain. No shortening or rotation of the right hip.

## 2016-04-10 NOTE — ED Provider Notes (Addendum)
CSN: [id number]     Arrival date & time 04/10/16  4098 History   First MD Initiated Contact with Patient 04/10/16 [phone]     Chief Complaint  Patient presents with  . Fall     (Consider location/radiation/quality/duration/timing/severity/associated sxs/prior Treatment) HPI Comments: Patient is a 82 year old female who lives in a nursing facility with a history of hypertension, osteoporosis, stroke, dementia and recurrent falls presenting today after being found on the floor by staff. It is unclear when the patient fell but they're thinking sometime in the night. Patient cannot remember what happened. She cannot recall she lost consciousness but currently she is at her baseline per staff. Patient is complaining of neck, back and right hip pain. She does have a history of recurrent right hip dislocations and has had multiple surgeries.  She currently denies chest pain, shortness of breath, abdominal pain, nausea or vomiting.  Patient is a 82 y.o. female presenting with fall. The history is provided by the patient, the nursing home and the EMS personnel. The history is limited by the absence of a caregiver.  Fall This is a new problem. Episode onset: most likely last night sometime. The problem occurs constantly. The problem has not changed since onset.Associated symptoms comments: C/o of neck, lower back and right hip pain. Exacerbated by: movement. The symptoms are relieved by rest. She has tried nothing for the symptoms. The treatment provided no relief.    Past Medical History  Diagnosis Date  . Hypertension   . Osteoporosis   . GERD (gastroesophageal reflux disease)   . Potassium (K) deficiency   . UTI (lower urinary tract infection)     LAST UTI MARCH 2015 ?  Marland Kitchen Stroke (HCC)     TIA - 20 SOME YRS AGO - STILL HAS TINGLING LEFT LEG - NO OTHER RESIDUALS  . Anxiety   . Dementia     STILL ABLE TO SIGN OWN LEGAL PAPERS  . Arthritis     C/O OF PAIN IN MIDDLE BACK - PAST HX OF FRACTURE /  OSTEOPOROSIS  . Recurrent falls     USES WALKER - LIVING INDEPENDENTLY IN APARTMENT AT [facility] UNTIL RECENT PROB WITH HIP DISLOCATION- IS CURRENTLY AT CARE CENTER [facility]  [phone]   Past Surgical History  Procedure Laterality Date  . Joint replacement      RIGHT HIP REPLACEMENT AND REVISIONS; LEFT TOTAL HIP REPLACEMENT AND REVISION.  Marland Kitchen Eye surgery      SURGERY FOR DETACHED RETINA  . Total hip revision Right 01/20/2014    Procedure: RIGHT HIP ACETABULAR REVISION ;  Surgeon: Loanne Drilling, MD;  Location: [facility];  Service: Orthopedics;  Laterality: Right;  . Esophagogastroduodenoscopy N/A 01/24/2014    Procedure: ESOPHAGOGASTRODUODENOSCOPY (EGD);  Surgeon: Meryl Dare, MD;  Location: [facility];  Service: Endoscopy;  Laterality: N/A;   No family history on file. Social History  Substance Use Topics  . Smoking status: Never Smoker   . Smokeless tobacco: Never Used  . Alcohol Use: No   OB History    No data available     Review of Systems  Unable to perform ROS: Dementia      Allergies  Altace; Calcitonin; Cardura; Fosamax; Penicillins; Tramadol; Codeine; Darvocet; and Plavix  Home Medications   Prior to Admission medications   Medication Sig Start Date End Date Taking? Authorizing Provider  acetaminophen (TYLENOL) 325 MG tablet Take 2 tablets (650 mg total) by mouth every 4 (four) hours as needed for mild pain.  03/31/15   Freeman Caldron, PA-C  ALPRAZolam Prudy Feeler) 0.25 MG tablet Take 1 tablet (0.25 mg total) by mouth 3 (three) times daily as needed for anxiety. 03/31/15   Freeman Caldron, PA-C  amLODipine (NORVASC) 2.5 MG tablet Take 1 tablet (2.5 mg total) by mouth daily. 01/27/14   Avel Peace, PA-C  aspirin EC 325 MG EC tablet Take 1 tablet (325 mg total) by mouth daily with breakfast. Take Aspirin 325mg  for two and a half more weeks, then discontinue Full Dose Aspirin. Once the patient has completed the Full Dose 325 mg Aspirin, they may resume the 81 mg  Aspirin. 01/27/14   Avel Peace, PA-C  bisacodyl (DULCOLAX) 10 MG suppository Place 1 suppository (10 mg total) rectally daily as needed for moderate constipation. 01/27/14   Avel Peace, PA-C  docusate sodium 100 MG CAPS Take 100 mg by mouth 2 (two) times daily. 01/27/14   Avel Peace, PA-C  HYDROmorphone (DILAUDID) 2 MG tablet Take 1 tablet (2 mg total) by mouth every 4 (four) hours as needed for severe pain. 03/31/15   Freeman Caldron, PA-C  indapamide (LOZOL) 1.25 MG tablet Take 1.25 mg by mouth every morning.    Historical Provider, MD  levofloxacin (LEVAQUIN) 750 MG tablet Take 1 tablet (750 mg total) by mouth every other day. Take for two more doses and then discontinue Levaquin. 01/27/14   Avel Peace, PA-C  Melatonin 5 MG TABS Take 5 mg by mouth at bedtime as needed (Sleep).    Historical Provider, MD  methocarbamol (ROBAXIN) 500 MG tablet Take 1 tablet (500 mg total) by mouth every 6 (six) hours as needed for muscle spasms. 01/27/14   Avel Peace, PA-C  metoCLOPramide (REGLAN) 5 MG tablet Take 1-2 tablets (5-10 mg total) by mouth every 8 (eight) hours as needed for nausea (if ondansetron (ZOFRAN) ineffective.). 01/27/14   Avel Peace, PA-C  Multiple Vitamin (MULTIVITAMIN) tablet Take 1 tablet by mouth daily.    Historical Provider, MD  ofloxacin (OCUFLOX) 0.3 % ophthalmic solution Place 1 drop into the right eye 2 (two) times daily.    Historical Provider, MD  ondansetron (ZOFRAN) 4 MG tablet Take 1 tablet (4 mg total) by mouth every 6 (six) hours as needed for nausea. 01/27/14   Avel Peace, PA-C  pantoprazole (PROTONIX) 40 MG tablet Take 1 tablet (40 mg total) by mouth daily. 01/27/14   Avel Peace, PA-C  polyethylene glycol (MIRALAX / GLYCOLAX) packet Take 17 g by mouth every other day.    Historical Provider, MD  sertraline (ZOLOFT) 50 MG tablet Take 50 mg by mouth every morning.    Historical Provider, MD  vitamin B-12 (CYANOCOBALAMIN) 100 MCG tablet Take 100 mcg by mouth daily.     Historical Provider, MD   There were no vitals taken for this visit. Physical Exam  Constitutional: She appears well-developed. She appears cachectic. No distress.  HENT:  Head: Normocephalic and atraumatic.  Mouth/Throat: Oropharynx is clear and moist. Mucous membranes are dry.  Eyes: Conjunctivae and EOM are normal. Pupils are equal, round, and reactive to light.  Neck: Neck supple. No spinous process tenderness and no muscular tenderness present.  Immobilized in a c-collar  Cardiovascular: Normal rate, regular rhythm and intact distal pulses.   No murmur heard. Pulmonary/Chest: Effort normal and breath sounds normal. No respiratory distress. She has no wheezes. She has no rales. She exhibits no tenderness.  Abdominal: Soft. She exhibits no distension. There is no tenderness. There is no rebound and  no guarding.  Musculoskeletal: Normal range of motion. She exhibits tenderness. She exhibits no edema.       Back:       Legs: Neurological: She is alert.  Oriented to self  Skin: Skin is warm and dry. No rash noted. No erythema.  Stage I decubitus ulcer on the left sacrum.  Bruises in multiple stages of healing over the upper and lower extremities  Psychiatric: She has a normal mood and affect. Her behavior is normal.  Nursing note and vitals reviewed.   ED Course  Procedures (including critical care time) Labs Review Labs Reviewed  CBC WITH DIFFERENTIAL/PLATELET - Abnormal; Notable for the following:    WBC 19.4 (*)    Neutro Abs 16.9 (*)    Monocytes Absolute 1.4 (*)    All other components within normal limits  CK - Abnormal; Notable for the following:    Total CK 1271 (*)    All other components within normal limits  I-STAT CHEM 8, ED - Abnormal; Notable for the following:    Chloride 99 (*)    BUN 42 (*)    Creatinine, Ser 1.80 (*)    Glucose, Bld 142 (*)    All other components within normal limits    Imaging Review Ct Head Wo Contrast  04/10/2016  CLINICAL DATA:   Found lying on floor this morning. Unwitnessed fall. History of dementia. Neck pain. EXAM: CT HEAD WITHOUT CONTRAST CT CERVICAL SPINE WITHOUT CONTRAST TECHNIQUE: Multidetector CT imaging of the head and cervical spine was performed following the standard protocol without intravenous contrast. Multiplanar CT image reconstructions of the cervical spine were also generated. COMPARISON:  03/28/2015. FINDINGS: CT HEAD FINDINGS There is no evidence for acute hemorrhage, hydrocephalus, mass lesion, or abnormal extra-axial fluid collection. No definite CT evidence for acute infarction. Diffuse loss of parenchymal volume is consistent with atrophy. Patchy low attenuation in the deep hemispheric and periventricular white matter is nonspecific, but likely reflects chronic microvascular ischemic demyelination. Lacunar infarcts identified in the cerebellar hemispheres bilaterally. The visualized paranasal sinuses and mastoid air cells are clear. No evidence for skull fracture. CT CERVICAL SPINE FINDINGS Imaging was obtained from the skullbase through the T2 vertebral body. The a no evidence of fracture. Spondylolisthesis at C3-4 is stable. There is advanced degenerative disc disease at C4-5, C5-6, C6-7, as before. Mild bilateral facet arthropathy is more advanced on the left than the right in a diffuse fashion. Straightening of the normal cervical lordosis evident. No prevertebral soft tissue edema IMPRESSION: 1. No acute intracranial abnormality. 2. Advanced chronic microvascular ischemic disease with bilateral cerebellar infarcts. 3. Atrophy. 4. No evidence for acute cervical spine fracture. 5. Stable spondylolisthesis at C3-4 with stable advanced degenerative disc disease in the mid and lower cervical levels. Electronically Signed   By: Kennith Center M.D.   On: 04/10/2016 10:48   Ct Cervical Spine Wo Contrast  04/10/2016  CLINICAL DATA:  Found lying on floor this morning. Unwitnessed fall. History of dementia. Neck pain.  EXAM: CT HEAD WITHOUT CONTRAST CT CERVICAL SPINE WITHOUT CONTRAST TECHNIQUE: Multidetector CT imaging of the head and cervical spine was performed following the standard protocol without intravenous contrast. Multiplanar CT image reconstructions of the cervical spine were also generated. COMPARISON:  03/28/2015. FINDINGS: CT HEAD FINDINGS There is no evidence for acute hemorrhage, hydrocephalus, mass lesion, or abnormal extra-axial fluid collection. No definite CT evidence for acute infarction. Diffuse loss of parenchymal volume is consistent with atrophy. Patchy low attenuation in the deep hemispheric  and periventricular white matter is nonspecific, but likely reflects chronic microvascular ischemic demyelination. Lacunar infarcts identified in the cerebellar hemispheres bilaterally. The visualized paranasal sinuses and mastoid air cells are clear. No evidence for skull fracture. CT CERVICAL SPINE FINDINGS Imaging was obtained from the skullbase through the T2 vertebral body. The a no evidence of fracture. Spondylolisthesis at C3-4 is stable. There is advanced degenerative disc disease at C4-5, C5-6, C6-7, as before. Mild bilateral facet arthropathy is more advanced on the left than the right in a diffuse fashion. Straightening of the normal cervical lordosis evident. No prevertebral soft tissue edema IMPRESSION: 1. No acute intracranial abnormality. 2. Advanced chronic microvascular ischemic disease with bilateral cerebellar infarcts. 3. Atrophy. 4. No evidence for acute cervical spine fracture. 5. Stable spondylolisthesis at C3-4 with stable advanced degenerative disc disease in the mid and lower cervical levels. Electronically Signed   By: Kennith CenterEric  Mansell M.D.   On: 04/10/2016 10:48   Ct Lumbar Spine Wo Contrast  04/10/2016  CLINICAL DATA:  Found on floor.  Back pain EXAM: CT LUMBAR SPINE WITHOUT CONTRAST TECHNIQUE: Multidetector CT imaging of the lumbar spine was performed without intravenous contrast  administration. Multiplanar CT image reconstructions were also generated. COMPARISON:  Lumbar MRI 05/19/2006.  X-ray 05/03/2010 FINDINGS: Moderate levoscoliosis as noted on prior studies. Chronic compression fracture T11 unchanged. No acute fracture identified. Negative for mass lesion Atherosclerotic aorta and iliac arteries without aneurysm. No retroperitoneal mass T12-L1:  Mild disc bulging without stenosis L1-2: Advanced disc degeneration right greater than left. No significant spinal stenosis L2-3: Disc degeneration and spurring. Mild right foraminal narrowing due to spurring. L3-4: Disc degeneration and spondylosis. Left foraminal narrowing due to spurring. No significant spinal stenosis L4-5: Disc degeneration and spurring, left greater than right. Mild left foraminal narrowing. Spinal canal adequate L5-S1: Negative IMPRESSION: Negative for fracture Moderate scoliosis.  Multilevel degenerative change as above Atherosclerotic aorta without aneurysm. Electronically Signed   By: Marlan Palauharles  Clark M.D.   On: 04/10/2016 10:53   Dg Hip Unilat With Pelvis 2-3 Views Right  04/10/2016  CLINICAL DATA:  Possible Fell last night per EMT note; pt denise right hip pain, pt states she had 6 hip surgeries between both hips EXAM: DG HIP (WITH OR WITHOUT PELVIS) 2-3V RIGHT COMPARISON:  CT 03/28/2015 FINDINGS: Bilateral hip arthroplasty hardware without evident fracture or dislocation. Distal aspect of both femoral stems not visualized however. Some bone cement protrudes from the right acetabular component into the true pelvis as before. Cerclage wires around the proximal right femoral shaft, incompletely visualized distally. Bony pelvis intact. Aortoiliac arterial calcifications without suggestion of aneurysm. Spondylitic changes in the lower lumbar spine. Bilateral pelvic phleboliths. IMPRESSION: 1. Bilateral hip arthroplasty hardware without dislocation. 2. No acute fracture. 3. Lower lumbar spondylitic changes. 4.  Aortic  Atherosclerosis (ICD10-170.0) Electronically Signed   By: Corlis Leak  Hassell M.D.   On: 04/10/2016 10:29   I have personally reviewed and evaluated these images and lab results as part of my medical decision-making.   EKG Interpretation None      MDM   Final diagnoses:  Traumatic rhabdomyolysis, initial encounter (HCC)  AKI (acute kidney injury) (HCC)    Patient is an elderly female with a history of dementia and recurrent falls who most likely fell sometime during the night and laid on the floor until this morning. Patient is at her normal mental status and is awake and alert at this time. She cannot recall what happened but is currently complaining of neck and back  pain. She is also complaining of some right hip pain. She does have evidence of stage I sacral ulcer on the left and bruising to the buttocks. Also some skin redness over her lower thoracic spine which is most likely from laying on the floor. She is able to move her upper and lower extremities without difficulty. She does have pain with moving side to side in her back and right hip. There is no obvious deformity of the right hip however given patient's history of recurrent dislocations right hip imaging, CT of the lumbar, head and cervical spine pending. Because we are unsure how long she was on the fluoroscopy CBC, Chem-8 and CK pending. vital signs currently stable and patient does not take anticoagulation.  11:33 AM Imaging is negative for acute injury and C-spine was cleared. However patient's lab came back with rhabdomyolysis with HAI. CKs 100 and creatinine has gone from 0.7-1.8. We'll start hydrating the patient and admit to ensure labs improve.  Gwyneth SproutWhitney Matalynn Graff, MD 04/10/16 1134  Gwyneth SproutWhitney Hiroshi Krummel, MD 04/10/16 1202

## 2016-04-10 NOTE — H&P (Signed)
History and Physical    Jonathon JordanJane W Franson ZOX:[id number] DOB: October 04, 1920 DOA: 04/10/2016  PCP: Londell MohPHARR,WALTER DAVIDSON, MD   Patient coming from: SNF. [name].  Chief Complaint: Fall  HPI: Arville GoJane W Shirkey is a 82 y.o. female with medical history significant of  HTN, TIA<dementia,recurrents fall who presents after unwitnessed fall. Patient unable to provide history due to underline dementia and she is not able to recall events. History obtain from  Chart. Patient was found  on the floor at her SNF. Patient was complaining of right hip pain, neck and lower back pain.  Patient on my evaluation denies pain, chest pain, abdominal pain , no dysuria,  ED Course: Cr at 1.8, CK 1271, Multiples CT head, cervical and lumbar spine negative for fracture, Bilateral hip x ray negative.   Review of Systems: As per HPI otherwise 10 point review of systems negative.    Past Medical History  Diagnosis Date  . Hypertension   . Osteoporosis   . GERD (gastroesophageal reflux disease)   . Potassium (K) deficiency   . UTI (lower urinary tract infection)     LAST UTI MARCH 2015 ?  Marland Kitchen. Stroke (HCC)     TIA - 20 SOME YRS AGO - STILL HAS TINGLING LEFT LEG - NO OTHER RESIDUALS  . Anxiety   . Dementia     STILL ABLE TO SIGN OWN LEGAL PAPERS  . Arthritis     C/O OF PAIN IN MIDDLE BACK - PAST HX OF FRACTURE / OSTEOPOROSIS  . Recurrent falls     USES [facility]NTIL RECENT PROB WITH HIP DISLOCATION- IS CURRENTLY [facility]  [phone]    Past Surgical History  Procedure Laterality Date  . Joint replacement      RIGHT HIP REPLACEMENT AND REVISIONS; LEFT TOTAL HIP REPLACEMENT AND REVISION.  Marland Kitchen. Eye surgery      SURGERY FOR DETACHED RETINA  . Total hip revision Right 01/20/2014    Procedure: RIGHT HIP ACETABULAR REVISION ;  Surgeon: Loanne DrillingFrank V Aluisio, MD;  Location: [facility];  Service: Orthopedics;  Laterality: Right;  .  Esophagogastroduodenoscopy N/A 01/24/2014    Procedure: ESOPHAGOGASTRODUODENOSCOPY (EGD);  Surgeon: Meryl DareMalcolm T Stark, MD;  [facility];  Service: Endoscopy;  Laterality: N/A;     reports that she has never smoked. She has never used smokeless tobacco. She reports that she does not drink alcohol or use illicit drugs.  Allergies  Allergen Reactions  . Altace [Ramipril]     Per MAR  . Calcitonin     Per MAR  . Cardura [Doxazosin Mesylate]     Per MAR  . Fosamax [Alendronate Sodium]     Per MAR  . Penicillins     Per MAR  . Tramadol     Per MAR  . Codeine Other (See Comments)    unknown  . Darvocet [Propoxyphene N-Acetaminophen] Other (See Comments)    unknown  . Plavix [Clopidogrel Bisulfate] Other (See Comments)    unsure    Family History: parents deceased,of unknown cause by patient    Prior to Admission medications   Medication Sig Start Date End Date Taking? Authorizing Provider  acetaminophen (TYLENOL) 500 MG tablet Take 1,000 mg by mouth every 6 (six) hours as needed for mild pain, moderate pain or headache.   Yes Historical Provider, MD  ALPRAZolam (XANAX) 0.25 MG tablet Take 1 tablet (0.25 mg total) by mouth 3 (three) times daily as needed  for anxiety. 03/31/15  Yes Freeman CaldronMichael J Jeffery, PA-C  amLODipine (NORVASC) 2.5 MG tablet Take 1 tablet (2.5 mg total) by mouth daily. 01/27/14  Yes Avel Peacerew Perkins, PA-C  aspirin EC 81 MG tablet Take 81 mg by mouth daily.   Yes Historical Provider, MD  bisacodyl (DULCOLAX) 10 MG suppository Place 1 suppository (10 mg total) rectally daily as needed for moderate constipation. 01/27/14  Yes Avel Peacerew Perkins, PA-C  docusate sodium 100 MG CAPS Take 100 mg by mouth 2 (two) times daily. 01/27/14  Yes Avel Peacerew Perkins, PA-C  indapamide (LOZOL) 1.25 MG tablet Take 1.25 mg by mouth every morning.   Yes Historical Provider, MD  Melatonin 5 MG TABS Take 5 mg by mouth at bedtime as needed (Sleep).   Yes Historical Provider, MD  methocarbamol (ROBAXIN)  500 MG tablet Take 1 tablet (500 mg total) by mouth every 6 (six) hours as needed for muscle spasms. 01/27/14  Yes Avel Peacerew Perkins, PA-C  Multiple Vitamin (MULTIVITAMIN) tablet Take 1 tablet by mouth daily.   Yes Historical Provider, MD  ofloxacin (OCUFLOX) 0.3 % ophthalmic solution Place 1 drop into the right eye 2 (two) times daily.   Yes Historical Provider, MD  ondansetron (ZOFRAN) 4 MG tablet Take 1 tablet (4 mg total) by mouth every 6 (six) hours as needed for nausea. 01/27/14  Yes Avel Peacerew Perkins, PA-C  pantoprazole (PROTONIX) 40 MG tablet Take 1 tablet (40 mg total) by mouth daily. 01/27/14  Yes Avel Peacerew Perkins, PA-C  polyethylene glycol (MIRALAX / GLYCOLAX) packet Take 17 g by mouth every other day.   Yes Historical Provider, MD  Potassium Chloride CR (MICRO-K) 8 MEQ CPCR capsule CR Take 1 capsule by mouth daily. 03/22/16  Yes Historical Provider, MD  sertraline (ZOLOFT) 25 MG tablet Take 25 mg by mouth daily. 03/22/16  Yes Historical Provider, MD  sertraline (ZOLOFT) 50 MG tablet Take 50 mg by mouth every morning.   Yes Historical Provider, MD  vitamin B-12 (CYANOCOBALAMIN) 100 MCG tablet Take 100 mcg by mouth daily.   Yes Historical Provider, MD  acetaminophen (TYLENOL) 325 MG tablet Take 2 tablets (650 mg total) by mouth every 4 (four) hours as needed for mild pain. Patient not taking: Reported on 04/10/2016 03/31/15   Freeman CaldronMichael J Jeffery, PA-C  aspirin EC 325 MG EC tablet Take 1 tablet (325 mg total) by mouth daily with breakfast. Take Aspirin 325mg  for two and a half more weeks, then discontinue Full Dose Aspirin. Once the patient has completed the Full Dose 325 mg Aspirin, they may resume the 81 mg Aspirin. Patient not taking: Reported on 04/10/2016 01/27/14   Avel Peacerew Perkins, PA-C    Physical Exam: Filed Vitals:   04/10/16 1009 04/10/16 1036 04/10/16 1038  BP: 137/77 134/56   Pulse: 95  78  Temp: 98.3 F (36.8 C)    Resp: 18    SpO2: 98%  93%      Constitutional: NAD, calm, comfortable Filed  Vitals:   04/10/16 1009 04/10/16 1036 04/10/16 1038  BP: 137/77 134/56   Pulse: 95  78  Temp: 98.3 F (36.8 C)    Resp: 18    SpO2: 98%  93%   Eyes: PERRL, lids and conjunctivae normal ENMT: Mucous membranes are moist. Posterior pharynx clear of any exudate or lesions.Normal dentition.  Neck: normal, supple, no masses, no thyromegaly Respiratory: clear to auscultation bilaterally, no wheezing, no crackles. Normal respiratory effort. No accessory muscle use.  Cardiovascular: Regular rate and rhythm, no murmurs / rubs /  gallops. No extremity edema. 2+ pedal pulses. No carotid bruits.  Abdomen: no tenderness, no masses palpated. No hepatosplenomegaly. Bowel sounds positive.  Musculoskeletal: no clubbing / cyanosis. No joint deformity upper and lower extremities. Good ROM, no contractures. Normal muscle tone.  Skin: no rashes, lesions, ulcers. No induration Neurologic: CN 2-12 grossly intact. Sensation intact, DTR normal. Strength 5/5 in all 4.  Psychiatric:  Normal mood.     Labs on Admission: I have personally reviewed following labs and imaging studies  CBC:  Recent Labs Lab 04/10/16 1051 04/10/16 1058  WBC 19.4*  --   NEUTROABS 16.9*  --   HGB 14.1 14.3  HCT 39.8 42.0  MCV 88.1  --   PLT 254  --    Basic Metabolic Panel:  Recent Labs Lab 04/10/16 1058  NA 139  K 4.0  CL 99*  GLUCOSE 142*  BUN 42*  CREATININE 1.80*   GFR: CrCl cannot be calculated (Unknown ideal weight.). Liver Function Tests: No results for input(s): AST, ALT, ALKPHOS, BILITOT, PROT, ALBUMIN in the last 168 hours. No results for input(s): LIPASE, AMYLASE in the last 168 hours. No results for input(s): AMMONIA in the last 168 hours. Coagulation Profile: No results for input(s): INR, PROTIME in the last 168 hours. Cardiac Enzymes:  Recent Labs Lab 04/10/16 1051  CKTOTAL 1271*   BNP (last 3 results) No results for input(s): PROBNP in the last 8760 hours. HbA1C: No results for input(s):  HGBA1C in the last 72 hours. CBG: No results for input(s): GLUCAP in the last 168 hours. Lipid Profile: No results for input(s): CHOL, HDL, LDLCALC, TRIG, CHOLHDL, LDLDIRECT in the last 72 hours. Thyroid Function Tests: No results for input(s): TSH, T4TOTAL, FREET4, T3FREE, THYROIDAB in the last 72 hours. Anemia Panel: No results for input(s): VITAMINB12, FOLATE, FERRITIN, TIBC, IRON, RETICCTPCT in the last 72 hours. Urine analysis:    Component Value Date/Time   COLORURINE YELLOW 03/28/2015 0649   APPEARANCEUR CLOUDY* 03/28/2015 0649   LABSPEC 1.011 03/28/2015 0649   PHURINE 7.5 03/28/2015 0649   GLUCOSEU NEGATIVE 03/28/2015 0649   HGBUR NEGATIVE 03/28/2015 0649   BILIRUBINUR NEGATIVE 03/28/2015 0649   KETONESUR NEGATIVE 03/28/2015 0649   PROTEINUR NEGATIVE 03/28/2015 0649   UROBILINOGEN 0.2 03/28/2015 0649   NITRITE NEGATIVE 03/28/2015 0649   LEUKOCYTESUR NEGATIVE 03/28/2015 0649   Sepsis Labs: !!!!!!!!!!!!!!!!!!!!!!!!!!!!!!!!!!!!!!!!!!!! @LABRCNTIP (procalcitonin:4,lacticidven:4) )No results found for this or any previous visit (from the past 240 hour(s)).   Radiological Exams on Admission: Ct Head Wo Contrast  04/10/2016  CLINICAL DATA:  Found lying on floor this morning. Unwitnessed fall. History of dementia. Neck pain. EXAM: CT HEAD WITHOUT CONTRAST CT CERVICAL SPINE WITHOUT CONTRAST TECHNIQUE: Multidetector CT imaging of the head and cervical spine was performed following the standard protocol without intravenous contrast. Multiplanar CT image reconstructions of the cervical spine were also generated. COMPARISON:  03/28/2015. FINDINGS: CT HEAD FINDINGS There is no evidence for acute hemorrhage, hydrocephalus, mass lesion, or abnormal extra-axial fluid collection. No definite CT evidence for acute infarction. Diffuse loss of parenchymal volume is consistent with atrophy. Patchy low attenuation in the deep hemispheric and periventricular white matter is nonspecific, but likely  reflects chronic microvascular ischemic demyelination. Lacunar infarcts identified in the cerebellar hemispheres bilaterally. The visualized paranasal sinuses and mastoid air cells are clear. No evidence for skull fracture. CT CERVICAL SPINE FINDINGS Imaging was obtained from the skullbase through the T2 vertebral body. The a no evidence of fracture. Spondylolisthesis at C3-4 is stable. There is advanced degenerative  disc disease at C4-5, C5-6, C6-7, as before. Mild bilateral facet arthropathy is more advanced on the left than the right in a diffuse fashion. Straightening of the normal cervical lordosis evident. No prevertebral soft tissue edema IMPRESSION: 1. No acute intracranial abnormality. 2. Advanced chronic microvascular ischemic disease with bilateral cerebellar infarcts. 3. Atrophy. 4. No evidence for acute cervical spine fracture. 5. Stable spondylolisthesis at C3-4 with stable advanced degenerative disc disease in the mid and lower cervical levels. Electronically Signed   By: Kennith Center M.D.   On: 04/10/2016 10:48   Ct Cervical Spine Wo Contrast  04/10/2016  CLINICAL DATA:  Found lying on floor this morning. Unwitnessed fall. History of dementia. Neck pain. EXAM: CT HEAD WITHOUT CONTRAST CT CERVICAL SPINE WITHOUT CONTRAST TECHNIQUE: Multidetector CT imaging of the head and cervical spine was performed following the standard protocol without intravenous contrast. Multiplanar CT image reconstructions of the cervical spine were also generated. COMPARISON:  03/28/2015. FINDINGS: CT HEAD FINDINGS There is no evidence for acute hemorrhage, hydrocephalus, mass lesion, or abnormal extra-axial fluid collection. No definite CT evidence for acute infarction. Diffuse loss of parenchymal volume is consistent with atrophy. Patchy low attenuation in the deep hemispheric and periventricular white matter is nonspecific, but likely reflects chronic microvascular ischemic demyelination. Lacunar infarcts identified in  the cerebellar hemispheres bilaterally. The visualized paranasal sinuses and mastoid air cells are clear. No evidence for skull fracture. CT CERVICAL SPINE FINDINGS Imaging was obtained from the skullbase through the T2 vertebral body. The a no evidence of fracture. Spondylolisthesis at C3-4 is stable. There is advanced degenerative disc disease at C4-5, C5-6, C6-7, as before. Mild bilateral facet arthropathy is more advanced on the left than the right in a diffuse fashion. Straightening of the normal cervical lordosis evident. No prevertebral soft tissue edema IMPRESSION: 1. No acute intracranial abnormality. 2. Advanced chronic microvascular ischemic disease with bilateral cerebellar infarcts. 3. Atrophy. 4. No evidence for acute cervical spine fracture. 5. Stable spondylolisthesis at C3-4 with stable advanced degenerative disc disease in the mid and lower cervical levels. Electronically Signed   By: Kennith Center M.D.   On: 04/10/2016 10:48   Ct Lumbar Spine Wo Contrast  04/10/2016  CLINICAL DATA:  Found on floor.  Back pain EXAM: CT LUMBAR SPINE WITHOUT CONTRAST TECHNIQUE: Multidetector CT imaging of the lumbar spine was performed without intravenous contrast administration. Multiplanar CT image reconstructions were also generated. COMPARISON:  Lumbar MRI 05/19/2006.  X-ray 05/03/2010 FINDINGS: Moderate levoscoliosis as noted on prior studies. Chronic compression fracture T11 unchanged. No acute fracture identified. Negative for mass lesion Atherosclerotic aorta and iliac arteries without aneurysm. No retroperitoneal mass T12-L1:  Mild disc bulging without stenosis L1-2: Advanced disc degeneration right greater than left. No significant spinal stenosis L2-3: Disc degeneration and spurring. Mild right foraminal narrowing due to spurring. L3-4: Disc degeneration and spondylosis. Left foraminal narrowing due to spurring. No significant spinal stenosis L4-5: Disc degeneration and spurring, left greater than right.  Mild left foraminal narrowing. Spinal canal adequate L5-S1: Negative IMPRESSION: Negative for fracture Moderate scoliosis.  Multilevel degenerative change as above Atherosclerotic aorta without aneurysm. Electronically Signed   By: Marlan Palau M.D.   On: 04/10/2016 10:53   Dg Hip Unilat With Pelvis 2-3 Views Right  04/10/2016  CLINICAL DATA:  Possible Fell last night per EMT note; pt denise right hip pain, pt states she had 6 hip surgeries between both hips EXAM: DG HIP (WITH OR WITHOUT PELVIS) 2-3V RIGHT COMPARISON:  CT 03/28/2015 FINDINGS:  Bilateral hip arthroplasty hardware without evident fracture or dislocation. Distal aspect of both femoral stems not visualized however. Some bone cement protrudes from the right acetabular component into the true pelvis as before. Cerclage wires around the proximal right femoral shaft, incompletely visualized distally. Bony pelvis intact. Aortoiliac arterial calcifications without suggestion of aneurysm. Spondylitic changes in the lower lumbar spine. Bilateral pelvic phleboliths. IMPRESSION: 1. Bilateral hip arthroplasty hardware without dislocation. 2. No acute fracture. 3. Lower lumbar spondylitic changes. 4.  Aortic Atherosclerosis (ICD10-170.0) Electronically Signed   By: Corlis Leak M.D.   On: 04/10/2016 10:29    EKG: Will order EKG   Assessment/Plan Principal Problem:   Rhabdomyolysis Active Problems:   Fall at home   Essential hypertension   AKI (acute kidney injury) (HCC)   Leukocytosis   Frequent falls  1-AKI; in setting of rhabdo.  IV fluids.  Strict I and o.  Repeat labs in am.  Check UA.   2-Rhabdomyolisis;  Recent fall, CK 1200. IV fluids.  Repeat CK level in am.   3-Leukocytosis; Check UA,Chestx ray.  Afebrile, hold on starting IV antibiotics for now. Repeat labs in am.   4-Dementia; continue with home medications.   5-HTN;hold Norvasc for now to avoid hypotension.   6-Frequent fall; fall precaution. PT consult.   DVT  prophylaxis: Heparin  Code Status: DNR Family Communication: Daughter over phone [employer] ; [phone]. Please up date daughter daily.  Disposition Plan: discharge in 2 days depending on renal function.  Consults called: none Admission status: observation.    Alba Cory MD [facility] Pager [phone]  If 7PM-7AM, please contact night-coverage [email or web address] Password [email or web address]  04/10/2016, 12:16 PM

## 2016-04-10 NOTE — ED Notes (Signed)
Bed: WA03 Expected date:  Expected time:  Means of arrival:  Comments: 82 yo fall; lower back pain

## 2016-04-10 NOTE — ED Notes (Signed)
Unable to collect labs at this time PT in CT 

## 2016-04-11 DIAGNOSIS — T796XXA Traumatic ischemia of muscle, initial encounter: Secondary | ICD-10-CM | POA: Diagnosis not present

## 2016-04-11 DIAGNOSIS — R197 Diarrhea, unspecified: Secondary | ICD-10-CM | POA: Diagnosis not present

## 2016-04-11 DIAGNOSIS — M810 Age-related osteoporosis without current pathological fracture: Secondary | ICD-10-CM | POA: Diagnosis present

## 2016-04-11 DIAGNOSIS — R296 Repeated falls: Secondary | ICD-10-CM | POA: Diagnosis not present

## 2016-04-11 DIAGNOSIS — L8990 Pressure ulcer of unspecified site, unspecified stage: Secondary | ICD-10-CM | POA: Insufficient documentation

## 2016-04-11 DIAGNOSIS — G8911 Acute pain due to trauma: Secondary | ICD-10-CM | POA: Diagnosis not present

## 2016-04-11 DIAGNOSIS — I10 Essential (primary) hypertension: Secondary | ICD-10-CM | POA: Diagnosis not present

## 2016-04-11 DIAGNOSIS — B9620 Unspecified Escherichia coli [E. coli] as the cause of diseases classified elsewhere: Secondary | ICD-10-CM | POA: Diagnosis not present

## 2016-04-11 DIAGNOSIS — K6289 Other specified diseases of anus and rectum: Secondary | ICD-10-CM | POA: Diagnosis not present

## 2016-04-11 DIAGNOSIS — Z7982 Long term (current) use of aspirin: Secondary | ICD-10-CM | POA: Diagnosis not present

## 2016-04-11 DIAGNOSIS — D519 Vitamin B12 deficiency anemia, unspecified: Secondary | ICD-10-CM | POA: Diagnosis not present

## 2016-04-11 DIAGNOSIS — W1830XA Fall on same level, unspecified, initial encounter: Secondary | ICD-10-CM | POA: Diagnosis present

## 2016-04-11 DIAGNOSIS — Y92129 Unspecified place in nursing home as the place of occurrence of the external cause: Secondary | ICD-10-CM | POA: Diagnosis not present

## 2016-04-11 DIAGNOSIS — M25551 Pain in right hip: Secondary | ICD-10-CM | POA: Diagnosis not present

## 2016-04-11 DIAGNOSIS — Z9181 History of falling: Secondary | ICD-10-CM | POA: Diagnosis not present

## 2016-04-11 DIAGNOSIS — N179 Acute kidney failure, unspecified: Secondary | ICD-10-CM | POA: Diagnosis not present

## 2016-04-11 DIAGNOSIS — Z880 Allergy status to penicillin: Secondary | ICD-10-CM | POA: Diagnosis not present

## 2016-04-11 DIAGNOSIS — M6282 Rhabdomyolysis: Secondary | ICD-10-CM | POA: Diagnosis not present

## 2016-04-11 DIAGNOSIS — R2689 Other abnormalities of gait and mobility: Secondary | ICD-10-CM | POA: Diagnosis not present

## 2016-04-11 DIAGNOSIS — L89312 Pressure ulcer of right buttock, stage 2: Secondary | ICD-10-CM | POA: Diagnosis not present

## 2016-04-11 DIAGNOSIS — Z885 Allergy status to narcotic agent status: Secondary | ICD-10-CM | POA: Diagnosis not present

## 2016-04-11 DIAGNOSIS — K219 Gastro-esophageal reflux disease without esophagitis: Secondary | ICD-10-CM | POA: Diagnosis present

## 2016-04-11 DIAGNOSIS — N390 Urinary tract infection, site not specified: Secondary | ICD-10-CM | POA: Diagnosis not present

## 2016-04-11 DIAGNOSIS — D72829 Elevated white blood cell count, unspecified: Secondary | ICD-10-CM | POA: Diagnosis not present

## 2016-04-11 DIAGNOSIS — Z96643 Presence of artificial hip joint, bilateral: Secondary | ICD-10-CM | POA: Diagnosis present

## 2016-04-11 DIAGNOSIS — E569 Vitamin deficiency, unspecified: Secondary | ICD-10-CM | POA: Diagnosis not present

## 2016-04-11 DIAGNOSIS — Z79899 Other long term (current) drug therapy: Secondary | ICD-10-CM | POA: Diagnosis not present

## 2016-04-11 DIAGNOSIS — I959 Hypotension, unspecified: Secondary | ICD-10-CM | POA: Diagnosis not present

## 2016-04-11 DIAGNOSIS — A044 Other intestinal Escherichia coli infections: Secondary | ICD-10-CM | POA: Diagnosis not present

## 2016-04-11 DIAGNOSIS — F0390 Unspecified dementia without behavioral disturbance: Secondary | ICD-10-CM | POA: Diagnosis present

## 2016-04-11 DIAGNOSIS — D649 Anemia, unspecified: Secondary | ICD-10-CM | POA: Diagnosis present

## 2016-04-11 DIAGNOSIS — Z888 Allergy status to other drugs, medicaments and biological substances status: Secondary | ICD-10-CM | POA: Diagnosis not present

## 2016-04-11 DIAGNOSIS — K5900 Constipation, unspecified: Secondary | ICD-10-CM | POA: Diagnosis not present

## 2016-04-11 DIAGNOSIS — R41841 Cognitive communication deficit: Secondary | ICD-10-CM | POA: Diagnosis not present

## 2016-04-11 DIAGNOSIS — F419 Anxiety disorder, unspecified: Secondary | ICD-10-CM | POA: Diagnosis present

## 2016-04-11 DIAGNOSIS — E876 Hypokalemia: Secondary | ICD-10-CM | POA: Diagnosis present

## 2016-04-11 DIAGNOSIS — L89310 Pressure ulcer of right buttock, unstageable: Secondary | ICD-10-CM | POA: Diagnosis present

## 2016-04-11 DIAGNOSIS — M6281 Muscle weakness (generalized): Secondary | ICD-10-CM | POA: Diagnosis not present

## 2016-04-11 DIAGNOSIS — L89320 Pressure ulcer of left buttock, unstageable: Secondary | ICD-10-CM | POA: Diagnosis not present

## 2016-04-11 DIAGNOSIS — Z66 Do not resuscitate: Secondary | ICD-10-CM | POA: Diagnosis present

## 2016-04-11 DIAGNOSIS — Z8673 Personal history of transient ischemic attack (TIA), and cerebral infarction without residual deficits: Secondary | ICD-10-CM | POA: Diagnosis not present

## 2016-04-11 DIAGNOSIS — F329 Major depressive disorder, single episode, unspecified: Secondary | ICD-10-CM | POA: Diagnosis not present

## 2016-04-11 DIAGNOSIS — K529 Noninfective gastroenteritis and colitis, unspecified: Secondary | ICD-10-CM | POA: Diagnosis not present

## 2016-04-11 DIAGNOSIS — R278 Other lack of coordination: Secondary | ICD-10-CM | POA: Diagnosis not present

## 2016-04-11 LAB — BASIC METABOLIC PANEL
ANION GAP: 6 (ref 5–15)
BUN: 28 mg/dL — ABNORMAL HIGH (ref 6–20)
CO2: 28 mmol/L (ref 22–32)
Calcium: 8.3 mg/dL — ABNORMAL LOW (ref 8.9–10.3)
Chloride: 106 mmol/L (ref 101–111)
Creatinine, Ser: 0.9 mg/dL (ref 0.44–1.00)
GFR calc Af Amer: >60 mL/min (ref >60)
GFR calc non Af Amer: 53 mL/min — ABNORMAL LOW (ref >60)
GLUCOSE: 107 mg/dL — AB (ref 65–99)
POTASSIUM: 2.9 mmol/L — AB (ref 3.5–5.1)
Sodium: 140 mmol/L (ref 135–145)

## 2016-04-11 LAB — MRSA PCR SCREENING: MRSA by PCR: NEGATIVE

## 2016-04-11 LAB — CBC
HEMATOCRIT: 32.8 % — AB (ref 36.0–46.0)
HEMOGLOBIN: 11.3 g/dL — AB (ref 12.0–15.0)
MCH: 31.4 pg (ref 26.0–34.0)
MCHC: 34.5 g/dL (ref 30.0–36.0)
MCV: 91.1 fL (ref 78.0–100.0)
Platelets: 182 10*3/uL (ref 150–400)
RBC: 3.6 MIL/uL — ABNORMAL LOW (ref 3.87–5.11)
RDW: 13.9 % (ref 11.5–15.5)
WBC: 13.1 10*3/uL — ABNORMAL HIGH (ref 4.0–10.5)

## 2016-04-11 LAB — CK: Total CK: 791 U/L — ABNORMAL HIGH (ref 38–234)

## 2016-04-11 LAB — MAGNESIUM: Magnesium: 1.7 mg/dL (ref 1.7–2.4)

## 2016-04-11 MED ORDER — KETOROLAC TROMETHAMINE 15 MG/ML IJ SOLN: 15.0000 mg | Freq: Once | INTRAMUSCULAR | Status: DC

## 2016-04-11 MED ORDER — POTASSIUM CHLORIDE 10 MEQ/100ML IV SOLN
10.0000 meq | INTRAVENOUS | Status: AC
  Administered 2016-04-11 (×4): 10 meq via INTRAVENOUS
  Filled 2016-04-11 (×4): qty 100

## 2016-04-11 MED ORDER — POTASSIUM CHLORIDE CRYS ER 20 MEQ PO TBCR
40.0000 meq | EXTENDED_RELEASE_TABLET | ORAL | Status: AC
  Administered 2016-04-11 (×2): 40 meq via ORAL
  Filled 2016-04-11 (×2): qty 2

## 2016-04-11 MED ORDER — CIPROFLOXACIN HCL 500 MG PO TABS
500.0000 mg | ORAL_TABLET | Freq: Two times a day (BID) | ORAL | Status: DC
  Administered 2016-04-11 (×2): 500 mg via ORAL
  Filled 2016-04-11 (×2): qty 1

## 2016-04-11 MED ORDER — POTASSIUM CHLORIDE CRYS ER 20 MEQ PO TBCR: 40.0000 meq | EXTENDED_RELEASE_TABLET | Freq: Once | ORAL | Status: DC

## 2016-04-11 MED ORDER — BISMUTH SUBSALICYLATE 262 MG/15ML PO SUSP
30.0000 mL | ORAL | Status: DC | PRN
  Filled 2016-04-11: qty 118

## 2016-04-11 MED ORDER — OXYCODONE HCL 5 MG PO TABS
5.0000 mg | ORAL_TABLET | Freq: Four times a day (QID) | ORAL | Status: DC | PRN
  Administered 2016-04-11 – 2016-04-13 (×6): 5 mg via ORAL
  Filled 2016-04-11 (×6): qty 1

## 2016-04-11 MED ORDER — SACCHAROMYCES BOULARDII 250 MG PO CAPS
250.0000 mg | ORAL_CAPSULE | Freq: Two times a day (BID) | ORAL | Status: DC
  Administered 2016-04-11 – 2016-04-13 (×5): 250 mg via ORAL
  Filled 2016-04-11 (×5): qty 1

## 2016-04-11 MED ORDER — MAGNESIUM SULFATE 2 GM/50ML IV SOLN
2.0000 g | Freq: Once | INTRAVENOUS | Status: AC
  Administered 2016-04-11: 2 g via INTRAVENOUS
  Filled 2016-04-11: qty 50

## 2016-04-11 NOTE — Care Management Obs Status (Signed)
MEDICARE OBSERVATION STATUS NOTIFICATION   Patient Details  Name: Loretta Garcia MRN: [id number] Date of Birth: 28-Oct-1919   Medicare Observation Status Notification Given:  Yes    Geni BersMcGibboney, Inmer Nix, RN 04/11/2016, 3:58 PM

## 2016-04-11 NOTE — NC FL2 (Signed)
[place] MEDICAID FL2 LEVEL OF CARE SCREENING TOOL     IDENTIFICATION  Patient Name: Loretta Garcia Birthdate: 1920/07/23 Sex: female Admission Date (Current Location): 04/10/2016  [facility] [place] [place]   Facility and Address:  [facility],  [address], [place] [id number]      Provider Number: [id number]  Attending Physician Name and Address:  Calvert CantorSaima Rizwan, MD  Relative Name and Phone Number:       Current Level of Care: Hospital Recommended Level of Care: Skilled Nursing Facility Prior Approval Number:    Date Approved/Denied:   PASRR Number: [id number]  Discharge Plan: SNF    Current Diagnoses: Patient Active Problem List   Diagnosis Date Noted  . Pressure ulcer 04/11/2016  . AKI (acute kidney injury) (HCC) 04/10/2016  . Leukocytosis 04/10/2016  . Frequent falls 04/10/2016  . Rhabdomyolysis 04/10/2016  . Multiple rib fractures 03/28/2015  . Aspiration pneumonia (HCC) 01/23/2014  . Nonspecific (abnormal) findings on radiological and other examination of gastrointestinal tract 01/23/2014  . Other dysphagia 01/23/2014  . Postoperative anemia due to acute blood loss 01/21/2014  . Essential hypertension 01/21/2014  . GERD (gastroesophageal reflux disease) 01/21/2014  . Hypopotassemia 01/21/2014  . Dislocation of hip joint prosthesis (HCC) 01/20/2014  . UTI (lower urinary tract infection) 07/08/2012  . Fall at home 07/07/2012  . Fracture, sternum closed 07/07/2012  . Hypokalemia 07/07/2012    Orientation RESPIRATION BLADDER Height & Weight     Self, Time, Situation, Place  Normal Incontinent Weight: 98 lb 8 oz (44.679 kg) Height:  5\' 3"  (160 cm)  BEHAVIORAL SYMPTOMS/MOOD NEUROLOGICAL BOWEL NUTRITION STATUS      Incontinent Diet (Normal)  AMBULATORY STATUS COMMUNICATION OF NEEDS Skin   Limited Assist Verbally                         Personal Care Assistance Level of Assistance  Bathing, Dressing Bathing Assistance:  Limited assistance   Dressing Assistance: Limited assistance     Functional Limitations Info             SPECIAL CARE FACTORS FREQUENCY  PT (By licensed PT), OT (By licensed OT)     PT Frequency: 5 OT Frequency: 5            Contractures      Additional Factors Info  Code Status, Allergies Code Status Info: DNR Allergies Info: Allergies:  Altace, Calcitonin, Cardura, Fosamax, Penicillins, Tramadol, Codeine, Darvocet, Plavix           Current Medications (04/11/2016):  This is the current hospital active medication list Current Facility-Administered Medications  Medication Dose Route Frequency Provider Last Rate Last Dose  . 0.9 %  sodium chloride infusion   Intravenous Continuous Belkys A Regalado, MD 100 mL/hr at 04/11/16 1151    . acetaminophen (TYLENOL) tablet 1,000 mg  1,000 mg Oral Q6H PRN Belkys A Regalado, MD   1,000 mg at 04/11/16 0656  . ALPRAZolam (XANAX) tablet 0.25 mg  0.25 mg Oral TID PRN Belkys A Regalado, MD      . aspirin EC tablet 81 mg  81 mg Oral Daily Belkys A Regalado, MD   81 mg at 04/11/16 0945  . ciprofloxacin (CIPRO) tablet 500 mg  500 mg Oral BID Calvert CantorSaima Rizwan, MD   500 mg at 04/11/16 1036  . docusate sodium (COLACE) capsule 100 mg  100 mg Oral BID Belkys A Regalado, MD   100 mg at 04/10/16 2136  .  feeding supplement (ENSURE ENLIVE) (ENSURE ENLIVE) liquid 237 mL  237 mL Oral BID BM Belkys A Regalado, MD   237 mL at 04/11/16 1151  . heparin injection 5,000 Units  5,000 Units Subcutaneous Q8H Belkys A Regalado, MD   5,000 Units at 04/11/16 0656  . ondansetron (ZOFRAN) tablet 4 mg  4 mg Oral Q6H PRN Belkys A Regalado, MD       Or  . ondansetron (ZOFRAN) injection 4 mg  4 mg Intravenous Q6H PRN Belkys A Regalado, MD      . polyethylene glycol (MIRALAX / GLYCOLAX) packet 17 g  17 g Oral QODAY Belkys A Regalado, MD   17 g at 04/10/16 1737  . potassium chloride 10 mEq in 100 mL IVPB  10 mEq Intravenous Q1 Hr x 4 Laura A Harduk, PA-C   10 mEq at 04/11/16  1151  . potassium chloride SA (K-DUR,KLOR-CON) CR tablet 40 mEq  40 mEq Oral Q4H Calvert CantorSaima Rizwan, MD   40 mEq at 04/11/16 0945  . saccharomyces boulardii (FLORASTOR) capsule 250 mg  250 mg Oral BID Calvert CantorSaima Rizwan, MD   250 mg at 04/11/16 0945  . sertraline (ZOLOFT) tablet 25 mg  25 mg Oral Daily Belkys A Regalado, MD   25 mg at 04/11/16 0945  . sodium chloride flush (NS) 0.9 % injection 3 mL  3 mL Intravenous Q12H Belkys A Regalado, MD   3 mL at 04/10/16 2200     Discharge Medications: Please see discharge summary for a list of discharge medications.  Relevant Imaging Results:  Relevant Lab Results:   Additional Information [id number]  Donnie CoffinErin M Emali Heyward, LCSW

## 2016-04-11 NOTE — Progress Notes (Signed)
PROGRESS NOTE    Loretta GoJane W Garcia  FAO:[id number] DOB: 12/30/1919 DOA: 04/10/2016  PCP: Londell MohPHARR,WALTER DAVIDSON, MD   Brief Narrative:  82 y/o with dementia, HTN, TIA with frequent falls presents from SNF for an unwitnessed fall. No fractures on imaging. Had rhabdomyolysis, AKI, leukocytosis. Had low grade fever over night and is having loose stools.   Subjective: Has pain in right buttock- no other complaints. large pressure ulcer on right buttock   Assessment & Plan:   Principal Problem:   Rhabdomyolysis - improving- due to fall and laying on floor for undetermined amount of time  Active Problems:  Fever 100.4 / leukocytosis / positive UA - U culture - Cipro  Diarrhea - check stool pathogen panel and c diff - Kaopectate for now  Pressure ulcer - WOC assisting with management   Hypotension with h/o  Essential hypertension - hold Indapamide and Norvasc    AKI (acute kidney injury) - baseline Cr 0.5-0.6- Cr on admission 1.88- improving - likely prerenal - hold Indapamide- cont IVF    Frequent falls  - orthostatic? Loss of balance?  - PT eval,   orthostatics negative today   Hypokalemia - replace, hold Indapamide- try to control diarrhea - Mg 1.7- given 2 g IV  Anemia -chronic- Hb 8-9 in the past  Dementia      DVT prophylaxis: Heparin Code Status: DNR Family Communication:  Disposition Plan: return to SNF when stable Consultants:    Procedures:    Antimicrobials:  Anti-infectives    None       Objective: Filed Vitals:   04/10/16 1305 04/10/16 2115 04/11/16 0000 04/11/16 0519  BP: 136/64 122/60  126/64  Pulse: 81 86  67  Temp: 98.1 F (36.7 C) 100.4 F (38 C) 98.6 F (37 C) 99.4 F (37.4 C)  TempSrc: Oral Oral Oral Oral  Resp: 18 20  16   Height: 5\' 3"  (1.6 m)     Weight: 43.3 kg (95 lb 7.4 oz)   44.679 kg (98 lb 8 oz)  SpO2: 98% 96%  97%    Intake/Output Summary (Last 24 hours) at 04/11/16 0831 Last data filed at 04/11/16 [id number]  Gross  per 24 hour  Intake 1796.67 ml  Output    500 ml  Net 1296.67 ml   Filed Weights   04/10/16 1305 04/11/16 0519  Weight: 43.3 kg (95 lb 7.4 oz) 44.679 kg (98 lb 8 oz)    Examination: General exam: Appears comfortable  HEENT: PERRLA, oral mucosa moist, no sclera icterus or thrush Respiratory system: Clear to auscultation. Respiratory effort normal. Cardiovascular system: S1 & S2 heard, RRR.  No murmurs  Gastrointestinal system: Abdomen soft, non-tender, nondistended. Normal bowel sound. No organomegaly Central nervous system: Alert and oriented. No focal neurological deficits. Extremities: No cyanosis, clubbing or edema Skin: large pressure ulcer on right buttock with red raw skin (unstageable per Wound care RNs note) Psychiatry:  Mood & affect appropriate.     Data Reviewed: I have personally reviewed following labs and imaging studies  CBC:  Recent Labs Lab 04/10/16 1051 04/10/16 1058 04/11/16 0454  WBC 19.4*  --  13.1*  NEUTROABS 16.9*  --   --   HGB 14.1 14.3 11.3*  HCT 39.8 42.0 32.8*  MCV 88.1  --  91.1  PLT 254  --  182   Basic Metabolic Panel:  Recent Labs Lab 04/10/16 1051 04/10/16 1058 04/11/16 0454  NA  --  139 140  K  --  4.0 2.9*  CL  --  99* 106  CO2  --   --  28  GLUCOSE  --  142* 107*  BUN  --  42* 28*  CREATININE 1.88* 1.80* 0.90  CALCIUM  --   --  8.3*  MG  --   --  1.7   GFR: Estimated Creatinine Clearance: 27.6 mL/min (by C-G formula based on Cr of 0.9). Liver Function Tests: No results for input(s): AST, ALT, ALKPHOS, BILITOT, PROT, ALBUMIN in the last 168 hours. No results for input(s): LIPASE, AMYLASE in the last 168 hours. No results for input(s): AMMONIA in the last 168 hours. Coagulation Profile: No results for input(s): INR, PROTIME in the last 168 hours. Cardiac Enzymes:  Recent Labs Lab 04/10/16 1051 04/11/16 0454  CKTOTAL 1271* 791*   BNP (last 3 results) No results for input(s): PROBNP in the last 8760  hours. HbA1C: No results for input(s): HGBA1C in the last 72 hours. CBG: No results for input(s): GLUCAP in the last 168 hours. Lipid Profile: No results for input(s): CHOL, HDL, LDLCALC, TRIG, CHOLHDL, LDLDIRECT in the last 72 hours. Thyroid Function Tests: No results for input(s): TSH, T4TOTAL, FREET4, T3FREE, THYROIDAB in the last 72 hours. Anemia Panel: No results for input(s): VITAMINB12, FOLATE, FERRITIN, TIBC, IRON, RETICCTPCT in the last 72 hours. Urine analysis:    Component Value Date/Time   COLORURINE YELLOW 04/10/2016 1755   APPEARANCEUR TURBID* 04/10/2016 1755   LABSPEC 1.018 04/10/2016 1755   PHURINE 6.5 04/10/2016 1755   GLUCOSEU NEGATIVE 04/10/2016 1755   HGBUR LARGE* 04/10/2016 1755   BILIRUBINUR NEGATIVE 04/10/2016 1755   KETONESUR NEGATIVE 04/10/2016 1755   PROTEINUR 30* 04/10/2016 1755   UROBILINOGEN 0.2 03/28/2015 0649   NITRITE POSITIVE* 04/10/2016 1755   LEUKOCYTESUR LARGE* 04/10/2016 1755   Sepsis Labs: (procalcitonin:4,lacticidven:4) ) Recent Results (from the past 240 hour(s))  MRSA PCR Screening     Status: None   Collection Time: 04/10/16 10:33 PM  Result Value Ref Range Status   MRSA by PCR NEGATIVE NEGATIVE Final    Comment:        The GeneXpert MRSA Assay (FDA approved for NASAL specimens only), is one component of a comprehensive MRSA colonization surveillance program. It is not intended to diagnose MRSA infection nor to guide or monitor treatment for MRSA infections.          Radiology Studies: Dg Chest 2 View  04/10/2016  CLINICAL DATA:  Found on floor.  Leukocytosis EXAM: CHEST  2 VIEW COMPARISON:  03/29/2015 FINDINGS: Heart size upper normal. Mild vascular congestion without edema or effusion. Negative for pneumonia. Atherosclerotic aorta. Severe compression fracture in the lower thoracic spine at approximately T12 unchanged from CT chest 03/28/2015 IMPRESSION: Mild vascular congestion without edema.  Negative for  pneumonia Atherosclerotic aorta Electronically Signed   By: Marlan Palau M.D.   On: 04/10/2016 12:44   Ct Head Wo Contrast  04/10/2016  CLINICAL DATA:  Found lying on floor this morning. Unwitnessed fall. History of dementia. Neck pain. EXAM: CT HEAD WITHOUT CONTRAST CT CERVICAL SPINE WITHOUT CONTRAST TECHNIQUE: Multidetector CT imaging of the head and cervical spine was performed following the standard protocol without intravenous contrast. Multiplanar CT image reconstructions of the cervical spine were also generated. COMPARISON:  03/28/2015. FINDINGS: CT HEAD FINDINGS There is no evidence for acute hemorrhage, hydrocephalus, mass lesion, or abnormal extra-axial fluid collection. No definite CT evidence for acute infarction. Diffuse loss of parenchymal volume is consistent with atrophy. Patchy low attenuation in the deep hemispheric  and periventricular white matter is nonspecific, but likely reflects chronic microvascular ischemic demyelination. Lacunar infarcts identified in the cerebellar hemispheres bilaterally. The visualized paranasal sinuses and mastoid air cells are clear. No evidence for skull fracture. CT CERVICAL SPINE FINDINGS Imaging was obtained from the skullbase through the T2 vertebral body. The a no evidence of fracture. Spondylolisthesis at C3-4 is stable. There is advanced degenerative disc disease at C4-5, C5-6, C6-7, as before. Mild bilateral facet arthropathy is more advanced on the left than the right in a diffuse fashion. Straightening of the normal cervical lordosis evident. No prevertebral soft tissue edema IMPRESSION: 1. No acute intracranial abnormality. 2. Advanced chronic microvascular ischemic disease with bilateral cerebellar infarcts. 3. Atrophy. 4. No evidence for acute cervical spine fracture. 5. Stable spondylolisthesis at C3-4 with stable advanced degenerative disc disease in the mid and lower cervical levels. Electronically Signed   By: Kennith Center M.D.   On: 04/10/2016  10:48   Ct Cervical Spine Wo Contrast  04/10/2016  CLINICAL DATA:  Found lying on floor this morning. Unwitnessed fall. History of dementia. Neck pain. EXAM: CT HEAD WITHOUT CONTRAST CT CERVICAL SPINE WITHOUT CONTRAST TECHNIQUE: Multidetector CT imaging of the head and cervical spine was performed following the standard protocol without intravenous contrast. Multiplanar CT image reconstructions of the cervical spine were also generated. COMPARISON:  03/28/2015. FINDINGS: CT HEAD FINDINGS There is no evidence for acute hemorrhage, hydrocephalus, mass lesion, or abnormal extra-axial fluid collection. No definite CT evidence for acute infarction. Diffuse loss of parenchymal volume is consistent with atrophy. Patchy low attenuation in the deep hemispheric and periventricular white matter is nonspecific, but likely reflects chronic microvascular ischemic demyelination. Lacunar infarcts identified in the cerebellar hemispheres bilaterally. The visualized paranasal sinuses and mastoid air cells are clear. No evidence for skull fracture. CT CERVICAL SPINE FINDINGS Imaging was obtained from the skullbase through the T2 vertebral body. The a no evidence of fracture. Spondylolisthesis at C3-4 is stable. There is advanced degenerative disc disease at C4-5, C5-6, C6-7, as before. Mild bilateral facet arthropathy is more advanced on the left than the right in a diffuse fashion. Straightening of the normal cervical lordosis evident. No prevertebral soft tissue edema IMPRESSION: 1. No acute intracranial abnormality. 2. Advanced chronic microvascular ischemic disease with bilateral cerebellar infarcts. 3. Atrophy. 4. No evidence for acute cervical spine fracture. 5. Stable spondylolisthesis at C3-4 with stable advanced degenerative disc disease in the mid and lower cervical levels. Electronically Signed   By: Kennith Center M.D.   On: 04/10/2016 10:48   Ct Lumbar Spine Wo Contrast  04/10/2016  CLINICAL DATA:  Found on floor.   Back pain EXAM: CT LUMBAR SPINE WITHOUT CONTRAST TECHNIQUE: Multidetector CT imaging of the lumbar spine was performed without intravenous contrast administration. Multiplanar CT image reconstructions were also generated. COMPARISON:  Lumbar MRI 05/19/2006.  X-ray 05/03/2010 FINDINGS: Moderate levoscoliosis as noted on prior studies. Chronic compression fracture T11 unchanged. No acute fracture identified. Negative for mass lesion Atherosclerotic aorta and iliac arteries without aneurysm. No retroperitoneal mass T12-L1:  Mild disc bulging without stenosis L1-2: Advanced disc degeneration right greater than left. No significant spinal stenosis L2-3: Disc degeneration and spurring. Mild right foraminal narrowing due to spurring. L3-4: Disc degeneration and spondylosis. Left foraminal narrowing due to spurring. No significant spinal stenosis L4-5: Disc degeneration and spurring, left greater than right. Mild left foraminal narrowing. Spinal canal adequate L5-S1: Negative IMPRESSION: Negative for fracture Moderate scoliosis.  Multilevel degenerative change as above Atherosclerotic aorta without aneurysm.  Electronically Signed   By: Marlan Palauharles  Clark M.D.   On: 04/10/2016 10:53   Dg Hip Unilat With Pelvis 2-3 Views Right  04/10/2016  CLINICAL DATA:  Possible Fell last night per EMT note; pt denise right hip pain, pt states she had 6 hip surgeries between both hips EXAM: DG HIP (WITH OR WITHOUT PELVIS) 2-3V RIGHT COMPARISON:  CT 03/28/2015 FINDINGS: Bilateral hip arthroplasty hardware without evident fracture or dislocation. Distal aspect of both femoral stems not visualized however. Some bone cement protrudes from the right acetabular component into the true pelvis as before. Cerclage wires around the proximal right femoral shaft, incompletely visualized distally. Bony pelvis intact. Aortoiliac arterial calcifications without suggestion of aneurysm. Spondylitic changes in the lower lumbar spine. Bilateral pelvic  phleboliths. IMPRESSION: 1. Bilateral hip arthroplasty hardware without dislocation. 2. No acute fracture. 3. Lower lumbar spondylitic changes. 4.  Aortic Atherosclerosis (ICD10-170.0) Electronically Signed   By: Corlis Leak  Hassell M.D.   On: 04/10/2016 10:29      Scheduled Meds: . aspirin EC  81 mg Oral Daily  . docusate sodium  100 mg Oral BID  . feeding supplement (ENSURE ENLIVE)  237 mL Oral BID BM  . heparin  5,000 Units Subcutaneous Q8H  . indapamide  1.25 mg Oral q morning - 10a  . pantoprazole  40 mg Oral Daily  . polyethylene glycol  17 g Oral QODAY  . potassium chloride  10 mEq Intravenous Q1 Hr x 4  . potassium chloride  40 mEq Oral Once  . sertraline  25 mg Oral Daily  . sodium chloride flush  3 mL Intravenous Q12H   Continuous Infusions: . sodium chloride 100 mL/hr at 04/11/16 0102        Time spent in minutes: 35    Tineka Uriegas, MD [facility] Pager: [email or web address] Password [email or web address] 04/11/2016, 8:31 AM

## 2016-04-11 NOTE — Consult Note (Addendum)
WOC wound consult note Reason for Consult: Severe IAD (Category 2) with a pressure injury (Unstageable) located over the right ischial tuberosity Wound type:Moisture (Specifically, incontinence associated dermatitis) with pressure Pressure Ulcer POA: Yes Measurement: right buttock:  11cm x 4cm x 1cm full thickness tissue loss area of IAD (red, open, moist, painful) with a 4cm x 3cm area of deep purple/maroon pressure injury at the distal third (over the IT).  Left buttock:  10cm x 4cm x 0.1cm area of IAD, full thickness (red, moist) Wound bed:As described above Drainage (amount, consistency, odor) scant serous Periwound:iontact, dry Dressing procedure/placement/frequency: I will provide a mattress replacement with low air loss feature and still have patient turned and repositioning side to side with superior leg positioned up and over the other leg to allow for maximum air flow to her buttocks. Timely cleansing following fecal incontinence using our house products and application of a moisture barrier are indicated. An IUC was placed yesterday top keep urine from the area.  A pressure redistribution chair pad is provided for her use when OOB in chair. Pressure redistribution heel boots are provided for pressure injury prevention to the vulnerable areas of the bilateral heels. WOC nursing team will not follow, but will remain available to this patient, the nursing and medical teams.  Please re-consult if needed. Thanks, Ladona MowLaurie Kolten Ryback, MSN, RN, GNP, Hans EdenCWOCN, CWON-AP, FAAN  Pager# [phone]

## 2016-04-11 NOTE — Evaluation (Signed)
Physical Therapy Evaluation Patient Details Name: Arville GoJane W Scheidegger MRN: [id number] DOB: 1920/08/17 Today's Date: 04/11/2016   History of Present Illness  82 y.o. female with medical history significant of HTN, TIA, dementia, recurrents fall who presents after unwitnessed fall at SNF.  Clinical Impression  Pt admitted with above diagnosis. Pt currently with functional limitations due to the deficits listed below (see PT Problem List).  Pt will benefit from skilled PT to increase their independence and safety with mobility to allow discharge to the venue listed below.  Pt with pain and redness over sacral area (RN present during session) which limited mobility today.  Pt from SNF so will defer to SNF if pt would benefit from skilled PT upon return.     Follow Up Recommendations SNF    Equipment Recommendations  None recommended by PT    Recommendations for Other Services       Precautions / Restrictions Precautions Precautions: Fall      Mobility  Bed Mobility Overal bed mobility: Needs Assistance Bed Mobility: Supine to Sit;Sit to Supine     Supine to sit: Min assist;HOB elevated Sit to supine: Mod assist   General bed mobility comments: assist for trunk upright and LEs onto bed  Transfers Overall transfer level: Needs assistance Equipment used: 2 person hand held assist Transfers: Sit to/from Stand Sit to Stand: Min assist         General transfer comment: assist to rise and steady, does require UE support, obtained orthostatics with RN (see docflowsheets), pt c/o pain on her bottom (redness) and RN provided pericare and barrier cream while standing, pt unable to tolerate further mobility today  Ambulation/Gait                Stairs            Wheelchair Mobility    Modified Rankin (Stroke Patients Only)       Balance Overall balance assessment: History of Falls                                           Pertinent Vitals/Pain  Pain Assessment: Faces Faces Pain Scale: Hurts whole lot Pain Location: sacral and midbuttocks area Pain Descriptors / Indicators: Sore;Discomfort;Moaning;Grimacing Pain Intervention(s): Monitored during session;Repositioned (RN aware and applied barrier cream)    Home Living Family/patient expects to be discharged to:: Skilled nursing facility                      Prior Function Level of Independence: Needs assistance         Comments: pt poor historian, recurrent falls     Hand Dominance        Extremity/Trunk Assessment   Upper Extremity Assessment: Generalized weakness           Lower Extremity Assessment: Generalized weakness         Communication   Communication: HOH  Cognition Arousal/Alertness: Awake/alert Behavior During Therapy: WFL for tasks assessed/performed Overall Cognitive Status: History of cognitive impairments - at baseline (hx of dementia, seems to be at baseline however no family present)                      General Comments      Exercises        Assessment/Plan    PT Assessment Patient needs continued PT services  PT  Diagnosis Difficulty walking;Generalized weakness   PT Problem List Decreased strength;Decreased activity tolerance;Decreased balance;Decreased mobility;Pain;Decreased skin integrity  PT Treatment Interventions DME instruction;Gait training;Functional mobility training;Patient/family education;Therapeutic activities;Therapeutic exercise;Balance training   PT Goals (Current goals can be found in the Care Plan section) Acute Rehab PT Goals PT Goal Formulation: Patient unable to participate in goal setting Time For Goal Achievement: 04/18/16 Potential to Achieve Goals: Good    Frequency Min 3X/week   Barriers to discharge        Co-evaluation               End of Session   Activity Tolerance: Patient limited by pain;Patient limited by fatigue Patient left: in bed;with call bell/phone  within reach;with bed alarm set;with nursing/sitter in room (repositioned with pillows)      Functional Assessment Tool Used: clinical judgement Functional Limitation: Mobility: Walking and moving around Mobility: Walking and Moving Around Current Status ([id number]([id number]): At least 40 percent but less than 60 percent impaired, limited or restricted Mobility: Walking and Moving Around Goal Status [phone](G8979): At least 20 percent but less than 40 percent impaired, limited or restricted    Time: 0951-1004 PT Time Calculation (min) (ACUTE ONLY): 13 min   Charges:   PT Evaluation $PT Eval Low Complexity: 1 Procedure     PT G Codes:   PT G-Codes **NOT FOR INPATIENT CLASS** Functional Assessment Tool Used: clinical judgement Functional Limitation: Mobility: Walking and moving around Mobility: Walking and Moving Around Current Status ([id number](G8978): At least 40 percent but less than 60 percent impaired, limited or restricted Mobility: Walking and Moving Around Goal Status [phone]([id number]): At least 20 percent but less than 40 percent impaired, limited or restricted    Brynn Mulgrew,KATHrine E 04/11/2016, 12:10 PM Zenovia JarredKati Erynne Kealey, PT, DPT 04/11/2016 Pager: [phone]

## 2016-04-11 NOTE — Progress Notes (Signed)
Initial Nutrition Assessment  DOCUMENTATION CODES:   Underweight  INTERVENTION:  -Ensure Enlive po TID, each supplement provides 350 kcal and 20 grams of protein  NUTRITION DIAGNOSIS:   Inadequate oral intake related to poor appetite as evidenced by per patient/family report.  GOAL:   Patient will meet greater than or equal to 90% of their needs  MONITOR:   PO intake, Supplement acceptance, Labs, I & O's  REASON FOR ASSESSMENT:   Malnutrition Screening Tool    ASSESSMENT:   Loretta Garcia is a 82 y.o. female with medical history significant of HTN, TIA<dementia,recurrents fall who presents after unwitnessed fall. Patient unable to provide history due to underline dementia and she is not able to recall events. History obtain from Chart. Patient was found on the floor at her SNF. Patient was complaining of right hip pain, neck and lower back pain.   Loretta Garcia was sleeping during my visit. Per RN request did not wake her up. Presented from SNF with baseline dementia unable to provide hx to MD. Currently exhibiting rhabdo and AKI. She is underweight for her BMI and also has multiple pressure ulcers.  Previous RD found pt to eat poorly [facility]. She exhibited some moderate-severe muscle depletions and not fat depletion at that time. Wt was 112# then. Monitor PO intake.  Labs and Medications reviewed: K 2.9; Phos 1.4; KCL 40mEq; Colace  Diet Order:  Diet regular Room service appropriate?: Yes; Fluid consistency:: Thin  Skin:   Pressure ulcer to buttocks & back   Last BM:  7/2  Height:   Ht Readings from Last 1 Encounters:  04/10/16 5\' 3"  (1.6 m)    Weight:   Wt Readings from Last 1 Encounters:  04/11/16 98 lb 8 oz (44.679 kg)    Ideal Body Weight:  52.27 kg  BMI:  Body mass index is 17.45 kg/(m^2).  Estimated Nutritional Needs:   Kcal:  1350-1550 calories  Protein:  55-65 grams  Fluid:  >/= 1.35L    Intake/Output Summary  (Last 24 hours) at 04/11/16 1229 Last data filed at 04/11/16 [id number]  Gross per 24 hour  Intake 1796.67 ml  Output    500 ml  Net 1296.67 ml    EDUCATION NEEDS:   No education needs identified at this time  Dionne AnoWilliam M. Loy Little, MS, RD LDN Inpatient Clinical Dietitian Pager [phone]

## 2016-04-12 DIAGNOSIS — K529 Noninfective gastroenteritis and colitis, unspecified: Secondary | ICD-10-CM

## 2016-04-12 LAB — GASTROINTESTINAL PANEL BY PCR, STOOL (REPLACES STOOL CULTURE)
ADENOVIRUS F40/41: NOT DETECTED
Astrovirus: NOT DETECTED
Campylobacter species: NOT DETECTED
Cryptosporidium: NOT DETECTED
Cyclospora cayetanensis: NOT DETECTED
E. COLI O157: NOT DETECTED
ENTEROAGGREGATIVE E COLI (EAEC): NOT DETECTED
Entamoeba histolytica: NOT DETECTED
Enterotoxigenic E coli (ETEC): NOT DETECTED
GIARDIA LAMBLIA: NOT DETECTED
NOROVIRUS GI/GII: NOT DETECTED
PLESIMONAS SHIGELLOIDES: NOT DETECTED
Rotavirus A: NOT DETECTED
SHIGA LIKE TOXIN PRODUCING E COLI (STEC): NOT DETECTED
SHIGELLA/ENTEROINVASIVE E COLI (EIEC): NOT DETECTED
Salmonella species: NOT DETECTED
Sapovirus (I, II, IV, and V): NOT DETECTED
VIBRIO CHOLERAE: NOT DETECTED
Vibrio species: NOT DETECTED
YERSINIA ENTEROCOLITICA: NOT DETECTED

## 2016-04-12 LAB — CBC
HCT: 31 % — ABNORMAL LOW (ref 36.0–46.0)
Hemoglobin: 10.3 g/dL — ABNORMAL LOW (ref 12.0–15.0)
MCH: 30.7 pg (ref 26.0–34.0)
MCHC: 33.2 g/dL (ref 30.0–36.0)
MCV: 92.3 fL (ref 78.0–100.0)
PLATELETS: 175 10*3/uL (ref 150–400)
RBC: 3.36 MIL/uL — ABNORMAL LOW (ref 3.87–5.11)
RDW: 14.2 % (ref 11.5–15.5)
WBC: 9.8 10*3/uL (ref 4.0–10.5)

## 2016-04-12 LAB — BASIC METABOLIC PANEL
Anion gap: 5 (ref 5–15)
BUN: 22 mg/dL — AB (ref 6–20)
CALCIUM: 7.8 mg/dL — AB (ref 8.9–10.3)
CO2: 26 mmol/L (ref 22–32)
CREATININE: 0.55 mg/dL (ref 0.44–1.00)
Chloride: 107 mmol/L (ref 101–111)
GFR calc non Af Amer: >60 mL/min (ref >60)
Glucose, Bld: 113 mg/dL — ABNORMAL HIGH (ref 65–99)
Potassium: 4 mmol/L (ref 3.5–5.1)
SODIUM: 138 mmol/L (ref 135–145)

## 2016-04-12 LAB — CK: Total CK: 402 U/L — ABNORMAL HIGH (ref 38–234)

## 2016-04-12 MED ORDER — CIPROFLOXACIN IN D5W 400 MG/200ML IV SOLN
400.0000 mg | Freq: Two times a day (BID) | INTRAVENOUS | Status: DC
  Administered 2016-04-12 – 2016-04-13 (×3): 400 mg via INTRAVENOUS
  Filled 2016-04-12 (×3): qty 200

## 2016-04-12 NOTE — Clinical Social Work Placement (Signed)
   CLINICAL SOCIAL WORK PLACEMENT  NOTE  Date:  04/12/2016  Patient Details  Name: Loretta Garcia MRN: [id number] Date of Birth: 11/24/1919  Clinical Social Work is seeking post-discharge placement for this patient at the Skilled  Nursing Facility level of care (*CSW will initial, date and re-position this form in  chart as items are completed):  Yes   Patient/family provided with [facility] Clinical Social Work Department's list of facilities offering this level of care within the geographic area requested by the patient (or if unable, by the patient's family).  Yes   Patient/family informed of their freedom to choose among providers that offer the needed level of care, that participate in Medicare, Medicaid or managed care program needed by the patient, have an available bed and are willing to accept the patient.  Yes   Patient/family informed of [facility]'s ownership interest in [facility] and [facility], as well as of the fact that they are under no obligation to receive care at these facilities.  PASRR submitted to EDS on       PASRR number received on       Existing PASRR number confirmed on 04/12/16     FL2 transmitted to all facilities in geographic area requested by pt/family on       FL2 transmitted to all facilities within larger geographic area on       Patient informed that his/her managed care company has contracts with or will negotiate with certain facilities, including the following:        Yes   Patient/family informed of bed offers received.  [facility]     Physician recommends and patient chooses bed at      [facility]  .  Patient to be transferred to facility by       Patient family notified on   of transfer.  Name of family member notified:        PHYSICIAN       Additional Comment:    _______________________________________________ Donnie CoffinErin M Madelin Weseman, LCSW 04/12/2016, 1:44 PM

## 2016-04-12 NOTE — Clinical Social Work Note (Signed)
Clinical Social Work Assessment  Patient Details  Name: Loretta Garcia MRN: [id number] Date of Birth: 11-15-1919  Date of referral:  04/12/16               Reason for consult:  Discharge Planning                Permission sought to share information with:  Facility Industrial/product designerContact Representative Permission granted to share information::  Yes, Verbal Permission Granted  Name::        Agency::     Relationship::     Contact Information:     Housing/Transportation Living arrangements for the past 2 months:  Skilled Nursing Facility Source of Information:  Patient, Adult Children Patient Interpreter Needed:  None Criminal Activity/Legal Involvement Pertinent to Current Situation/Hospitalization:    Significant Relationships:  Adult Children Lives with:  Self Do you feel safe going back to the place where you live?    Need for family participation in patient care:     Care giving concerns:  CSW received consult to speak with family regarding discharge plans.    Social Worker assessment / plan: CSW spoke with pt at bedside regarding discharge plans. [facility] and would like to return to their facility. CSW also spoke with pt's daughter and she would also like for her to return [facility]. Pt and pt's daughter both mentioned that they were pleased with the facility. CSW spoke with [facility] at [facility] and they are prepared for her to return whenever she is stable.   Employment status:  Retired Health and safety inspectornsurance information:    PT Recommendations:  Skilled Teacher, early years/preursing Facility Information / Referral to community resources:  Skilled Nursing Facility  Patient/Family's Response to care:  Pt and pt's daughters were appreciative of CSW.  Patient/Family's Understanding of and Emotional Response to Diagnosis, Current Treatment, and Prognosis:  [facility] and [facility] with their facility. Pt did not seem concerned with the process and [facility]. Pt  was calm when CSW asked her about her discharge plans and did not have any questions.   Emotional Assessment Appearance:  Appears stated age Attitude/Demeanor/Rapport:    Affect (typically observed):  Appropriate, Calm, Stable Orientation:  Oriented to Self, Oriented to Place, Oriented to  Time, Oriented to Situation, Fluctuating Orientation (Suspected and/or reported Sundowners) Alcohol / Substance use:    Psych involvement (Current and /or in the community):  No (Comment)  Discharge Needs  Concerns to be addressed:  No discharge needs identified Readmission within the last 30 days:  No Current discharge risk:  None Barriers to Discharge:  No Barriers Identified   Loretta Coffinrin M [name], LCSW 04/12/2016, 1:46 PM

## 2016-04-12 NOTE — Progress Notes (Signed)
CRITICAL VALUE ALERT  Critical value received:  Enteropathogenic E.coli  Date of notification:  04/12/16  Time of notification:  0039  Critical value read back:Yes.    Nurse who received alert:  Yong ChannelHilda Marcellas Marchant  MD notified (1st page):  NP on call  Time of first page:  01:01  MD notified (2nd page):  Time of second page:  Responding MD:  None  Time MD responded:  None

## 2016-04-12 NOTE — Progress Notes (Addendum)
PROGRESS NOTE    Loretta Garcia  ZOX:[id number] DOB: [birth year] DOA: 04/10/2016  PCP: Londell MohPHARR,WALTER DAVIDSON, MD   Brief Narrative:  82 y/o with dementia, HTN, TIA with frequent falls presents from SNF for an unwitnessed fall. No fractures on imaging. Had rhabdomyolysis, AKI, leukocytosis. Had low grade fever over night and is having loose stools.   Subjective: No complaints. Per RN, stool are more formed.   Assessment & Plan:   Principal Problem:   Rhabdomyolysis - improving- due to fall and laying on floor for undetermined amount of time  Active Problems:  Fever 100.4 / leukocytosis / positive UA - U culture > 100,000 gr neg rods - Cipro  Diarrhea- enterocolitis-  - check stool pathogen panel and c diff>> enteropathogenic E coli- cont Cipro as she is improving with this- changed to IV form for now - discussed with ID- Dr Orvan Falconerampbell recommends 5-6 days of Cipro  - Kaopectate   Pressure ulcer - WOC assisting with management- air mattress   Hypotension with h/o  Essential hypertension - hold Indapamide and Norvasc    AKI (acute kidney injury) - baseline Cr 0.5-0.6- Cr on admission 1.88- improving - likely prerenal - hold Indapamide- cont IVF    Frequent falls  - orthostatic? Loss of balance?  - PT eval,   orthostatics negative     Hypokalemia - replaced, hold Indapamide- try to control diarrhea - Mg 1.7- given 2 g IV  Anemia -chronic- Hb 8-9 in the past  Dementia      DVT prophylaxis: Heparin Code Status: DNR Family Communication:  Disposition Plan: return to SNF when stable Consultants:    Procedures:    Antimicrobials:  Anti-infectives    Start     Dose/Rate Route Frequency Ordered Stop   04/12/16 0800  ciprofloxacin (CIPRO) IVPB 400 mg     400 mg 200 mL/hr over 60 Minutes Intravenous Every 12 hours 04/12/16 0749     04/11/16 0930  ciprofloxacin (CIPRO) tablet 500 mg  Status:  Discontinued     500 mg Oral 2 times daily 04/11/16 0844 04/12/16 0749        Objective: Filed Vitals:   04/11/16 0519 04/11/16 1505 04/11/16 2127 04/12/16 0432  BP: 126/64 100/52 117/55 112/63  Pulse: 67 73 70 61  Temp: 99.4 F (37.4 C) 98.8 F (37.1 C) 100.1 F (37.8 C) 98.6 F (37 C)  TempSrc: Oral Oral Oral Oral  Resp: 16 16 16 18   Height:      Weight: 44.679 kg (98 lb 8 oz)   51.529 kg (113 lb 9.6 oz)  SpO2: 97% 96% 96% 96%    Intake/Output Summary (Last 24 hours) at 04/12/16 1236 Last data filed at 04/12/16 0654  Gross per 24 hour  Intake   2685 ml  Output   1101 ml  Net   1584 ml   Filed Weights   04/10/16 1305 04/11/16 0519 04/12/16 0432  Weight: 43.3 kg (95 lb 7.4 oz) 44.679 kg (98 lb 8 oz) 51.529 kg (113 lb 9.6 oz)    Examination: General exam: Appears comfortable  HEENT: PERRLA, oral mucosa moist, no sclera icterus or thrush Respiratory system: Clear to auscultation. Respiratory effort normal. Cardiovascular system: S1 & S2 heard, RRR.  No murmurs  Gastrointestinal system: Abdomen soft, non-tender, nondistended. Normal bowel sound. No organomegaly Central nervous system: Alert and oriented. No focal neurological deficits. Extremities: No cyanosis, clubbing or edema Skin: large pressure ulcer on right buttock with red raw skin (unstageable per Wound  care RNs note) Psychiatry:  Mood & affect appropriate.     Data Reviewed: I have personally reviewed following labs and imaging studies  CBC:  Recent Labs Lab 04/10/16 1051 04/10/16 1058 04/11/16 0454 04/12/16 0448  WBC 19.4*  --  13.1* 9.8  NEUTROABS 16.9*  --   --   --   HGB 14.1 14.3 11.3* 10.3*  HCT 39.8 42.0 32.8* 31.0*  MCV 88.1  --  91.1 92.3  PLT 254  --  182 175   Basic Metabolic Panel:  Recent Labs Lab 04/10/16 1051 04/10/16 1058 04/11/16 0454 04/12/16 0448  NA  --  139 140 138  K  --  4.0 2.9* 4.0  CL  --  99* 106 107  CO2  --   --  28 26  GLUCOSE  --  142* 107* 113*  BUN  --  42* 28* 22*  CREATININE 1.88* 1.80* 0.90 0.55  CALCIUM  --   --   8.3* 7.8*  MG  --   --  1.7  --    GFR: Estimated Creatinine Clearance: 35.7 mL/min (by C-G formula based on Cr of 0.55). Liver Function Tests: No results for input(s): AST, ALT, ALKPHOS, BILITOT, PROT, ALBUMIN in the last 168 hours. No results for input(s): LIPASE, AMYLASE in the last 168 hours. No results for input(s): AMMONIA in the last 168 hours. Coagulation Profile: No results for input(s): INR, PROTIME in the last 168 hours. Cardiac Enzymes:  Recent Labs Lab 04/10/16 1051 04/11/16 0454 04/12/16 0448  CKTOTAL 1271* 791* 402*   BNP (last 3 results) No results for input(s): PROBNP in the last 8760 hours. HbA1C: No results for input(s): HGBA1C in the last 72 hours. CBG: No results for input(s): GLUCAP in the last 168 hours. Lipid Profile: No results for input(s): CHOL, HDL, LDLCALC, TRIG, CHOLHDL, LDLDIRECT in the last 72 hours. Thyroid Function Tests: No results for input(s): TSH, T4TOTAL, FREET4, T3FREE, THYROIDAB in the last 72 hours. Anemia Panel: No results for input(s): VITAMINB12, FOLATE, FERRITIN, TIBC, IRON, RETICCTPCT in the last 72 hours. Urine analysis:    Component Value Date/Time   COLORURINE YELLOW 04/10/2016 1755   APPEARANCEUR TURBID* 04/10/2016 1755   LABSPEC 1.018 04/10/2016 1755   PHURINE 6.5 04/10/2016 1755   GLUCOSEU NEGATIVE 04/10/2016 1755   HGBUR LARGE* 04/10/2016 1755   BILIRUBINUR NEGATIVE 04/10/2016 1755   KETONESUR NEGATIVE 04/10/2016 1755   PROTEINUR 30* 04/10/2016 1755   UROBILINOGEN 0.2 03/28/2015 0649   NITRITE POSITIVE* 04/10/2016 1755   LEUKOCYTESUR LARGE* 04/10/2016 1755   Sepsis Labs: (procalcitonin:4,lacticidven:4) ) Recent Results (from the past 240 hour(s))  MRSA PCR Screening     Status: None   Collection Time: 04/10/16 10:33 PM  Result Value Ref Range Status   MRSA by PCR NEGATIVE NEGATIVE Final    Comment:        The GeneXpert MRSA Assay (FDA approved for NASAL specimens only), is one component of  a comprehensive MRSA colonization surveillance program. It is not intended to diagnose MRSA infection nor to guide or monitor treatment for MRSA infections.   Urine culture     Status: Abnormal (Preliminary result)   Collection Time: 04/11/16  8:57 AM  Result Value Ref Range Status   Specimen Description URINE, CATHETERIZED  Final   Special Requests Normal  Final   Culture >=100,000 COLONIES/mL GRAM NEGATIVE RODS (A)  Final   Report Status PENDING  Incomplete  Gastrointestinal Panel by PCR , Stool     Status:  Abnormal   Collection Time: 04/11/16  4:07 PM  Result Value Ref Range Status   Campylobacter species NOT DETECTED NOT DETECTED Final   Plesimonas shigelloides NOT DETECTED NOT DETECTED Final   Salmonella species NOT DETECTED NOT DETECTED Final   Yersinia enterocolitica NOT DETECTED NOT DETECTED Final   Vibrio species NOT DETECTED NOT DETECTED Final   Vibrio cholerae NOT DETECTED NOT DETECTED Final   Enteroaggregative E coli (EAEC) NOT DETECTED NOT DETECTED Final   Enteropathogenic E coli (EPEC) (A) NOT DETECTED Final    CRITICAL RESULT CALLED TO, READ BACK BY AND VERIFIED WITH:    Comment: HILDA MJANG ON 04/12/16 AT 0038 [facility]   Enterotoxigenic E coli (ETEC) NOT DETECTED NOT DETECTED Final   Shiga like toxin producing E coli (STEC) NOT DETECTED NOT DETECTED Final   E. coli O157 NOT DETECTED NOT DETECTED Final   Shigella/Enteroinvasive E coli (EIEC) NOT DETECTED NOT DETECTED Final   Cryptosporidium NOT DETECTED NOT DETECTED Final   Cyclospora cayetanensis NOT DETECTED NOT DETECTED Final   Entamoeba histolytica NOT DETECTED NOT DETECTED Final   Giardia lamblia NOT DETECTED NOT DETECTED Final   Adenovirus F40/41 NOT DETECTED NOT DETECTED Final   Astrovirus NOT DETECTED NOT DETECTED Final   Norovirus GI/GII NOT DETECTED NOT DETECTED Final   Rotavirus A NOT DETECTED NOT DETECTED Final   Sapovirus (I, II, IV, and V) NOT DETECTED NOT DETECTED Final         Radiology  Studies: No results found.    Scheduled Meds: . aspirin EC  81 mg Oral Daily  . ciprofloxacin  400 mg Intravenous Q12H  . docusate sodium  100 mg Oral BID  . feeding supplement (ENSURE ENLIVE)  237 mL Oral BID BM  . heparin  5,000 Units Subcutaneous Q8H  . polyethylene glycol  17 g Oral QODAY  . saccharomyces boulardii  250 mg Oral BID  . sertraline  25 mg Oral Daily  . sodium chloride flush  3 mL Intravenous Q12H   Continuous Infusions: . sodium chloride 100 mL/hr at 04/12/16 0608     LOS: 1 day    Time spent in minutes: 35    Jaleyah Longhi, MD [facility] Pager: [email or web address] Password [email or web address] 04/12/2016, 12:36 PM

## 2016-04-12 NOTE — Progress Notes (Signed)
Pt discharging to SNF. 

## 2016-04-13 DIAGNOSIS — I10 Essential (primary) hypertension: Secondary | ICD-10-CM

## 2016-04-13 DIAGNOSIS — F419 Anxiety disorder, unspecified: Secondary | ICD-10-CM | POA: Diagnosis not present

## 2016-04-13 DIAGNOSIS — N390 Urinary tract infection, site not specified: Secondary | ICD-10-CM | POA: Diagnosis not present

## 2016-04-13 DIAGNOSIS — B9620 Unspecified Escherichia coli [E. coli] as the cause of diseases classified elsewhere: Secondary | ICD-10-CM | POA: Diagnosis not present

## 2016-04-13 DIAGNOSIS — L89320 Pressure ulcer of left buttock, unstageable: Secondary | ICD-10-CM | POA: Diagnosis not present

## 2016-04-13 DIAGNOSIS — M6281 Muscle weakness (generalized): Secondary | ICD-10-CM | POA: Diagnosis not present

## 2016-04-13 DIAGNOSIS — A044 Other intestinal Escherichia coli infections: Secondary | ICD-10-CM | POA: Diagnosis not present

## 2016-04-13 DIAGNOSIS — K219 Gastro-esophageal reflux disease without esophagitis: Secondary | ICD-10-CM | POA: Diagnosis not present

## 2016-04-13 DIAGNOSIS — E569 Vitamin deficiency, unspecified: Secondary | ICD-10-CM | POA: Diagnosis not present

## 2016-04-13 DIAGNOSIS — N19 Unspecified kidney failure: Secondary | ICD-10-CM | POA: Diagnosis not present

## 2016-04-13 DIAGNOSIS — N179 Acute kidney failure, unspecified: Secondary | ICD-10-CM | POA: Diagnosis not present

## 2016-04-13 DIAGNOSIS — R296 Repeated falls: Secondary | ICD-10-CM | POA: Diagnosis not present

## 2016-04-13 DIAGNOSIS — F0150 Vascular dementia without behavioral disturbance: Secondary | ICD-10-CM | POA: Diagnosis not present

## 2016-04-13 DIAGNOSIS — K5900 Constipation, unspecified: Secondary | ICD-10-CM | POA: Diagnosis not present

## 2016-04-13 DIAGNOSIS — Z9181 History of falling: Secondary | ICD-10-CM | POA: Diagnosis not present

## 2016-04-13 DIAGNOSIS — F329 Major depressive disorder, single episode, unspecified: Secondary | ICD-10-CM | POA: Diagnosis not present

## 2016-04-13 DIAGNOSIS — R197 Diarrhea, unspecified: Secondary | ICD-10-CM | POA: Diagnosis not present

## 2016-04-13 DIAGNOSIS — E876 Hypokalemia: Secondary | ICD-10-CM | POA: Diagnosis not present

## 2016-04-13 DIAGNOSIS — D519 Vitamin B12 deficiency anemia, unspecified: Secondary | ICD-10-CM | POA: Diagnosis not present

## 2016-04-13 DIAGNOSIS — L89300 Pressure ulcer of unspecified buttock, unstageable: Secondary | ICD-10-CM | POA: Diagnosis not present

## 2016-04-13 DIAGNOSIS — L89312 Pressure ulcer of right buttock, stage 2: Secondary | ICD-10-CM | POA: Diagnosis not present

## 2016-04-13 DIAGNOSIS — R41841 Cognitive communication deficit: Secondary | ICD-10-CM | POA: Diagnosis not present

## 2016-04-13 DIAGNOSIS — R2689 Other abnormalities of gait and mobility: Secondary | ICD-10-CM | POA: Diagnosis not present

## 2016-04-13 DIAGNOSIS — D72829 Elevated white blood cell count, unspecified: Secondary | ICD-10-CM | POA: Diagnosis not present

## 2016-04-13 DIAGNOSIS — G8911 Acute pain due to trauma: Secondary | ICD-10-CM | POA: Diagnosis not present

## 2016-04-13 DIAGNOSIS — M6282 Rhabdomyolysis: Secondary | ICD-10-CM | POA: Diagnosis not present

## 2016-04-13 DIAGNOSIS — R278 Other lack of coordination: Secondary | ICD-10-CM | POA: Diagnosis not present

## 2016-04-13 DIAGNOSIS — L89309 Pressure ulcer of unspecified buttock, unspecified stage: Secondary | ICD-10-CM | POA: Diagnosis not present

## 2016-04-13 LAB — URINE CULTURE
Culture: >=100000 — AB
Special Requests: NORMAL

## 2016-04-13 MED ORDER — SACCHAROMYCES BOULARDII 250 MG PO CAPS: 250.0000 mg | ORAL_CAPSULE | Freq: Two times a day (BID) | ORAL | Status: AC

## 2016-04-13 MED ORDER — CIPROFLOXACIN HCL 100 MG PO TABS: 100.0000 mg | ORAL_TABLET | Freq: Two times a day (BID) | ORAL | Status: AC

## 2016-04-13 NOTE — Progress Notes (Signed)
Physical Therapy Treatment Patient Details Name: Loretta Garcia MRN: [id number] DOB: 01-06-1920 Today's Date: 04/13/2016    History of Present Illness 82 y.o. female with medical history significant of HTN, TIA, dementia, recurrents fall who presents after unwitnessed fall at SNF.    PT Comments    Pt laying flat and had lunch tray in front of her so assisted pt OOB to recliner for lunch.  Pt's recliner had cushion and pillows assisted with repositioning.  RN notified of pt up to chair to limit time in sitting due to pressure injury and IAD.   Follow Up Recommendations  SNF     Equipment Recommendations  None recommended by PT    Recommendations for Other Services       Precautions / Restrictions Precautions Precautions: Fall    Mobility  Bed Mobility Overal bed mobility: Needs Assistance Bed Mobility: Supine to Sit     Supine to sit: Min assist;HOB elevated     General bed mobility comments: assist for trunk upright   Transfers Overall transfer level: Needs assistance Equipment used: Rolling walker (2 wheeled) Transfers: Sit to/from [employer]Stand;Stand Pivot Transfers Sit to Stand: Mod assist Stand pivot transfers: Mod assist       General transfer comment: assist to rise, steady and control descent, pt reporting increased pain due to severe IAD, chair cushion in recliner  Ambulation/Gait                 Stairs            Wheelchair Mobility    Modified Rankin (Stroke Patients Only)       Balance Overall balance assessment: History of Falls                                  Cognition Arousal/Alertness: Awake/alert Behavior During Therapy: WFL for tasks assessed/performed Overall Cognitive Status: History of cognitive impairments - at baseline (hx of dementia, seems to be at baseline however no family present)                      Exercises      General Comments        Pertinent Vitals/Pain Pain Assessment:  Faces Faces Pain Scale: Hurts whole lot Pain Location: buttocks Pain Descriptors / Indicators: Sore;Discomfort;Grimacing Pain Intervention(s): Monitored during session;Repositioned    Home Living                      Prior Function            PT Goals (current goals can now be found in the care plan section) Progress towards PT goals: Progressing toward goals    Frequency  Min 3X/week    PT Plan Current plan remains appropriate    Co-evaluation             End of Session   Activity Tolerance: Patient limited by pain Patient left: with call bell/phone within reach;in chair;with chair alarm set     Time: [id number] PT Time Calculation (min) (ACUTE ONLY): 15 min  Charges:  $Therapeutic Activity: 8-22 mins                    G Codes:      Zurich Carreno,KATHrine E 04/13/2016, 3:57 PM Zenovia JarredKati English [place], PT, DPT 04/13/2016 Pager: [phone]

## 2016-04-13 NOTE — Clinical Social Work Placement (Signed)
   CLINICAL SOCIAL WORK PLACEMENT  NOTE  Date:  04/13/2016  Patient Details  Name: Loretta Garcia MRN: [id number] Date of Birth: Mar 07, 1920  Clinical Social Work is seeking post-discharge placement for this patient at the Skilled  Nursing Facility level of care (*CSW will initial, date and re-position this form in  chart as items are completed):  Yes   Patient/family provided with [place] Clinical Social Work Department's list of facilities offering this level of care within the geographic area requested by the patient (or if unable, by the patient's family).  Yes   Patient/family informed of their freedom to choose among providers that offer the needed level of care, that participate in Medicare, Medicaid or managed care program needed by the patient, have an available bed and are willing to accept the patient.  Yes   Patient/family informed of [facility]'s ownership interest in [facility] and [facility], as well as of the fact that they are under no obligation to receive care at these facilities.  PASRR submitted to EDS on       PASRR number received on       Existing PASRR number confirmed on 04/12/16     FL2 transmitted to all facilities in geographic area requested by pt/family on       FL2 transmitted to all facilities within larger geographic area on       Patient informed that his/her managed care company has contracts with or will negotiate with certain facilities, including the following:        Yes   Patient/family informed of bed offers received.  [facility]     Physician recommends and patient chooses bed at      [facility].  Patient to be transferred to facility by PTAR     Patient family notified on 04/13/16 of transfer.  Name of family member notified:  Loretta Garcia     PHYSICIAN       Additional Comment:    _______________________________________________ Donnie CoffinErin M Lewellyn Fultz,  LCSW 04/13/2016, 4:20 PM

## 2016-04-13 NOTE — Progress Notes (Signed)
Report called and given to Misty StanleyLisa, RN at [facility].  VVS.  Patient to be d/c with foley catheter in place.  Transport by carelink set up by SW.

## 2016-04-13 NOTE — Progress Notes (Signed)
[facility]. Patient & daughter aware. Discharge packet given to RN, Florentina AddisonKatie. PTAR called for transport.   Stacy GardnerErin Apolonio Cutting, LCSWA Clinical Social Worker [phone]

## 2016-04-13 NOTE — Discharge Summary (Signed)
Physician Discharge Summary  Loretta Garcia ZOX:[id number] DOB: April 07, 1920 DOA: 04/10/2016  PCP: [name], MD  Admit date: 04/10/2016 Discharge date: 04/13/2016  Disposition:  SNF  Recommendations for Outpatient Follow-up:  1. Follow up with PCP in 1-2 weeks 2. Patient will be discharged with one billing Foley catheter secondary to healing wound around rectum. Please wean Foley catheter as tolerated over the next 1-2 weeks at facility.  Discharge Condition:Stable CODE STATUS:DNR Diet recommendation: Regular  Brief/Interim Summary: 82 y/o with dementia, HTN, TIA with frequent falls presents from SNF for an unwitnessed fall. No fractures on imaging. Had rhabdomyolysis, AKI, leukocytosis. Patient later noted to have loose stools.   Rhabdomyolysis - improved - due to fall and laying on floor for undetermined amount of time  Fever 100.4 / leukocytosis / positive UA - U culture > 100,000 gr neg rods - Continued on Cipro this admission  Diarrhea- enterocolitis-  - checked stool pathogen panel and c diff>> pos for enteropathogenic E coli- cont Cipro - discussed with ID who recommended 5-6 days of ciprofloxacin. Per ID, OK to complete course with PO abx  Pressure ulcer - WOC assisting with management- air mattress  Hypotension with h/o Essential hypertension - held Indapamide and Norvasc   AKI (acute kidney injury) - baseline Cr 0.5-0.6- Cr on admission 1.88- improving - likely prerenal - held Indapamide and cont IVF this admission   Frequent falls  - PT eval, orthostatics negative  - Plan to d/c to SNF  Hypokalemia - replaced, held Indapamide  Anemia -chronic- Hb 8-9 in the past -Stable  Dementia  -Seems stable at this time  Discharge Diagnoses:  Principal Problem:   Rhabdomyolysis Active Problems:   Fall at home   Essential hypertension   AKI (acute kidney injury) (HCC)   Leukocytosis   Frequent falls   Pressure ulcer  Discharge  Instructions    Medication List    STOP taking these medications        indapamide 1.25 MG tablet  Commonly known as:  LOZOL      TAKE these medications        acetaminophen 500 MG tablet  Commonly known as:  TYLENOL  Take 1,000 mg by mouth every 6 (six) hours as needed for mild pain, moderate pain or headache.     ALPRAZolam 0.25 MG tablet  Commonly known as:  XANAX  Take 1 tablet (0.25 mg total) by mouth 3 (three) times daily as needed for anxiety.     amLODipine 2.5 MG tablet  Commonly known as:  NORVASC  Take 1 tablet (2.5 mg total) by mouth daily.     aspirin EC 81 MG tablet  Take 81 mg by mouth daily.     CERTA-VITE PO  Take 1 tablet by mouth daily.     ciprofloxacin 100 MG tablet  Commonly known as:  CIPRO  Take 1 tablet (100 mg total) by mouth 2 (two) times daily.     DSS 100 MG Caps  Take 100 mg by mouth 2 (two) times daily.     pantoprazole 40 MG tablet  Commonly known as:  PROTONIX  Take 1 tablet (40 mg total) by mouth daily.     polyethylene glycol packet  Commonly known as:  MIRALAX / GLYCOLAX  Take 17 g by mouth See admin instructions. EVERY 2 DAYS     Potassium Chloride CR 8 MEQ Cpcr capsule CR  Commonly known as:  MICRO-K  Take 1 capsule by mouth daily.  saccharomyces boulardii 250 MG capsule  Commonly known as:  FLORASTOR  Take 1 capsule (250 mg total) by mouth 2 (two) times daily.     sertraline 25 MG tablet  Commonly known as:  ZOLOFT  Take 25 mg by mouth daily.     vitamin B-12 100 MCG tablet  Commonly known as:  CYANOCOBALAMIN  Take 100 mcg by mouth daily.        Allergies  Allergen Reactions  . Altace [Ramipril]     Per MAR  . Calcitonin     Per MAR  . Cardura [Doxazosin Mesylate]     Per MAR  . Fosamax [Alendronate Sodium]     Per MAR  . Penicillins     Per MAR  . Tramadol     Per MAR  . Codeine Other (See Comments)    unknown  . Darvocet [Propoxyphene N-Acetaminophen] Other (See Comments)    unknown  .  Plavix [Clopidogrel Bisulfate] Other (See Comments)    unsure      Procedures/Studies: Dg Chest 2 View  04/10/2016  CLINICAL DATA:  Found on floor.  Leukocytosis EXAM: CHEST  2 VIEW COMPARISON:  03/29/2015 FINDINGS: Heart size upper normal. Mild vascular congestion without edema or effusion. Negative for pneumonia. Atherosclerotic aorta. Severe compression fracture in the lower thoracic spine at approximately T12 unchanged from CT chest 03/28/2015 IMPRESSION: Mild vascular congestion without edema.  Negative for pneumonia Atherosclerotic aorta Electronically Signed   By: Marlan Palauharles  Clark M.D.   On: 04/10/2016 12:44   Ct Head Wo Contrast  04/10/2016  CLINICAL DATA:  Found lying on floor this morning. Unwitnessed fall. History of dementia. Neck pain. EXAM: CT HEAD WITHOUT CONTRAST CT CERVICAL SPINE WITHOUT CONTRAST TECHNIQUE: Multidetector CT imaging of the head and cervical spine was performed following the standard protocol without intravenous contrast. Multiplanar CT image reconstructions of the cervical spine were also generated. COMPARISON:  03/28/2015. FINDINGS: CT HEAD FINDINGS There is no evidence for acute hemorrhage, hydrocephalus, mass lesion, or abnormal extra-axial fluid collection. No definite CT evidence for acute infarction. Diffuse loss of parenchymal volume is consistent with atrophy. Patchy low attenuation in the deep hemispheric and periventricular white matter is nonspecific, but likely reflects chronic microvascular ischemic demyelination. Lacunar infarcts identified in the cerebellar hemispheres bilaterally. The visualized paranasal sinuses and mastoid air cells are clear. No evidence for skull fracture. CT CERVICAL SPINE FINDINGS Imaging was obtained from the skullbase through the T2 vertebral body. The a no evidence of fracture. Spondylolisthesis at C3-4 is stable. There is advanced degenerative disc disease at C4-5, C5-6, C6-7, as before. Mild bilateral facet arthropathy is more  advanced on the left than the right in a diffuse fashion. Straightening of the normal cervical lordosis evident. No prevertebral soft tissue edema IMPRESSION: 1. No acute intracranial abnormality. 2. Advanced chronic microvascular ischemic disease with bilateral cerebellar infarcts. 3. Atrophy. 4. No evidence for acute cervical spine fracture. 5. Stable spondylolisthesis at C3-4 with stable advanced degenerative disc disease in the mid and lower cervical levels. Electronically Signed   By: Kennith CenterEric  Mansell M.D.   On: 04/10/2016 10:48   Ct Cervical Spine Wo Contrast  04/10/2016  CLINICAL DATA:  Found lying on floor this morning. Unwitnessed fall. History of dementia. Neck pain. EXAM: CT HEAD WITHOUT CONTRAST CT CERVICAL SPINE WITHOUT CONTRAST TECHNIQUE: Multidetector CT imaging of the head and cervical spine was performed following the standard protocol without intravenous contrast. Multiplanar CT image reconstructions of the cervical spine were also generated. COMPARISON:  03/28/2015. FINDINGS: CT HEAD FINDINGS There is no evidence for acute hemorrhage, hydrocephalus, mass lesion, or abnormal extra-axial fluid collection. No definite CT evidence for acute infarction. Diffuse loss of parenchymal volume is consistent with atrophy. Patchy low attenuation in the deep hemispheric and periventricular white matter is nonspecific, but likely reflects chronic microvascular ischemic demyelination. Lacunar infarcts identified in the cerebellar hemispheres bilaterally. The visualized paranasal sinuses and mastoid air cells are clear. No evidence for skull fracture. CT CERVICAL SPINE FINDINGS Imaging was obtained from the skullbase through the T2 vertebral body. The a no evidence of fracture. Spondylolisthesis at C3-4 is stable. There is advanced degenerative disc disease at C4-5, C5-6, C6-7, as before. Mild bilateral facet arthropathy is more advanced on the left than the right in a diffuse fashion. Straightening of the normal  cervical lordosis evident. No prevertebral soft tissue edema IMPRESSION: 1. No acute intracranial abnormality. 2. Advanced chronic microvascular ischemic disease with bilateral cerebellar infarcts. 3. Atrophy. 4. No evidence for acute cervical spine fracture. 5. Stable spondylolisthesis at C3-4 with stable advanced degenerative disc disease in the mid and lower cervical levels. Electronically Signed   By: Kennith Center M.D.   On: 04/10/2016 10:48   Ct Lumbar Spine Wo Contrast  04/10/2016  CLINICAL DATA:  Found on floor.  Back pain EXAM: CT LUMBAR SPINE WITHOUT CONTRAST TECHNIQUE: Multidetector CT imaging of the lumbar spine was performed without intravenous contrast administration. Multiplanar CT image reconstructions were also generated. COMPARISON:  Lumbar MRI 05/19/2006.  X-ray 05/03/2010 FINDINGS: Moderate levoscoliosis as noted on prior studies. Chronic compression fracture T11 unchanged. No acute fracture identified. Negative for mass lesion Atherosclerotic aorta and iliac arteries without aneurysm. No retroperitoneal mass T12-L1:  Mild disc bulging without stenosis L1-2: Advanced disc degeneration right greater than left. No significant spinal stenosis L2-3: Disc degeneration and spurring. Mild right foraminal narrowing due to spurring. L3-4: Disc degeneration and spondylosis. Left foraminal narrowing due to spurring. No significant spinal stenosis L4-5: Disc degeneration and spurring, left greater than right. Mild left foraminal narrowing. Spinal canal adequate L5-S1: Negative IMPRESSION: Negative for fracture Moderate scoliosis.  Multilevel degenerative change as above Atherosclerotic aorta without aneurysm. Electronically Signed   By: Marlan Palau M.D.   On: 04/10/2016 10:53   Dg Hip Unilat With Pelvis 2-3 Views Right  04/10/2016  CLINICAL DATA:  Possible Fell last night per EMT note; pt denise right hip pain, pt states she had 6 hip surgeries between both hips EXAM: DG HIP (WITH OR WITHOUT PELVIS)  2-3V RIGHT COMPARISON:  CT 03/28/2015 FINDINGS: Bilateral hip arthroplasty hardware without evident fracture or dislocation. Distal aspect of both femoral stems not visualized however. Some bone cement protrudes from the right acetabular component into the true pelvis as before. Cerclage wires around the proximal right femoral shaft, incompletely visualized distally. Bony pelvis intact. Aortoiliac arterial calcifications without suggestion of aneurysm. Spondylitic changes in the lower lumbar spine. Bilateral pelvic phleboliths. IMPRESSION: 1. Bilateral hip arthroplasty hardware without dislocation. 2. No acute fracture. 3. Lower lumbar spondylitic changes. 4.  Aortic Atherosclerosis (ICD10-170.0) Electronically Signed   By: Corlis Leak M.D.   On: 04/10/2016 10:29     Subjective:  Complains of rectal pain while moving bowels this AM  Discharge Exam: Filed Vitals:   04/12/16 2017 04/13/16 0600  BP: 121/55 138/58  Pulse: 74 70  Temp: 98.5 F (36.9 C) 98.5 F (36.9 C)  Resp: 18 18   Filed Vitals:   04/12/16 0432 04/12/16 1501 04/12/16 2017 04/13/16 0600  BP: 112/63 110/70 121/55 138/58  Pulse: 61 65 74 70  Temp: 98.6 F (37 C) 98.8 F (37.1 C) 98.5 F (36.9 C) 98.5 F (36.9 C)  TempSrc: Oral Oral Oral Oral  Resp: 18 16 18 18   Height:      Weight: 51.529 kg (113 lb 9.6 oz)     SpO2: 96% 96% 95% 94%    General: Pt is alert, awake, not in acute distress Cardiovascular: RRR, S1/S2 +, no rubs, no gallops Respiratory: CTA bilaterally, no wheezing, no rhonchi Abdominal: Soft, NT, ND, bowel sounds + Extremities: no edema, no cyanosis    The results of significant diagnostics from this hospitalization (including imaging, microbiology, ancillary and laboratory) are listed below for reference.     Microbiology: Recent Results (from the past 240 hour(s))  MRSA PCR Screening     Status: None   Collection Time: 04/10/16 10:33 PM  Result Value Ref Range Status   MRSA by PCR NEGATIVE  NEGATIVE Final    Comment:        The GeneXpert MRSA Assay (FDA approved for NASAL specimens only), is one component of a comprehensive MRSA colonization surveillance program. It is not intended to diagnose MRSA infection nor to guide or monitor treatment for MRSA infections.   Urine culture     Status: Abnormal   Collection Time: 04/11/16  8:57 AM  Result Value Ref Range Status   Specimen Description URINE, CATHETERIZED  Final   Special Requests Normal  Final   Culture >=100,000 COLONIES/mL KLEBSIELLA PNEUMONIAE (A)  Final   Report Status 04/13/2016 FINAL  Final   Organism ID, Bacteria KLEBSIELLA PNEUMONIAE (A)  Final      Susceptibility   Klebsiella pneumoniae - MIC*    AMPICILLIN >=32 RESISTANT Resistant     CEFAZOLIN <=4 SENSITIVE Sensitive     CEFTRIAXONE <=1 SENSITIVE Sensitive     CIPROFLOXACIN <=0.25 SENSITIVE Sensitive     GENTAMICIN <=1 SENSITIVE Sensitive     IMIPENEM 0.5 SENSITIVE Sensitive     NITROFURANTOIN 64 INTERMEDIATE Intermediate     TRIMETH/SULFA <=20 SENSITIVE Sensitive     AMPICILLIN/SULBACTAM 4 SENSITIVE Sensitive     PIP/TAZO <=4 SENSITIVE Sensitive     * >=100,000 COLONIES/mL KLEBSIELLA PNEUMONIAE  Gastrointestinal Panel by PCR , Stool     Status: Abnormal   Collection Time: 04/11/16  4:07 PM  Result Value Ref Range Status   Campylobacter species NOT DETECTED NOT DETECTED Final   Plesimonas shigelloides NOT DETECTED NOT DETECTED Final   Salmonella species NOT DETECTED NOT DETECTED Final   Yersinia enterocolitica NOT DETECTED NOT DETECTED Final   Vibrio species NOT DETECTED NOT DETECTED Final   Vibrio cholerae NOT DETECTED NOT DETECTED Final   Enteroaggregative E coli (EAEC) NOT DETECTED NOT DETECTED Final   Enteropathogenic E coli (EPEC) (A) NOT DETECTED Final    CRITICAL RESULT CALLED TO, READ BACK BY AND VERIFIED WITH:    Comment: HILDA MJANG ON 04/12/16 AT 0038 [facility]   Enterotoxigenic E coli (ETEC) NOT DETECTED NOT DETECTED Final   Shiga like  toxin producing E coli (STEC) NOT DETECTED NOT DETECTED Final   E. coli O157 NOT DETECTED NOT DETECTED Final   Shigella/Enteroinvasive E coli (EIEC) NOT DETECTED NOT DETECTED Final   Cryptosporidium NOT DETECTED NOT DETECTED Final   Cyclospora cayetanensis NOT DETECTED NOT DETECTED Final   Entamoeba histolytica NOT DETECTED NOT DETECTED Final   Giardia lamblia NOT DETECTED NOT DETECTED Final   Adenovirus F40/41 NOT DETECTED NOT DETECTED  Final   Astrovirus NOT DETECTED NOT DETECTED Final   Norovirus GI/GII NOT DETECTED NOT DETECTED Final   Rotavirus A NOT DETECTED NOT DETECTED Final   Sapovirus (I, II, IV, and V) NOT DETECTED NOT DETECTED Final     Labs: BNP (last 3 results) No results for input(s): BNP in the last 8760 hours. Basic Metabolic Panel:  Recent Labs Lab 04/10/16 1051 04/10/16 1058 04/11/16 0454 04/12/16 0448  NA  --  139 140 138  K  --  4.0 2.9* 4.0  CL  --  99* 106 107  CO2  --   --  28 26  GLUCOSE  --  142* 107* 113*  BUN  --  42* 28* 22*  CREATININE 1.88* 1.80* 0.90 0.55  CALCIUM  --   --  8.3* 7.8*  MG  --   --  1.7  --    Liver Function Tests: No results for input(s): AST, ALT, ALKPHOS, BILITOT, PROT, ALBUMIN in the last 168 hours. No results for input(s): LIPASE, AMYLASE in the last 168 hours. No results for input(s): AMMONIA in the last 168 hours. CBC:  Recent Labs Lab 04/10/16 1051 04/10/16 1058 04/11/16 0454 04/12/16 0448  WBC 19.4*  --  13.1* 9.8  NEUTROABS 16.9*  --   --   --   HGB 14.1 14.3 11.3* 10.3*  HCT 39.8 42.0 32.8* 31.0*  MCV 88.1  --  91.1 92.3  PLT 254  --  182 175   Cardiac Enzymes:  Recent Labs Lab 04/10/16 1051 04/11/16 0454 04/12/16 0448  CKTOTAL 1271* 791* 402*   BNP: Invalid input(s): POCBNP CBG: No results for input(s): GLUCAP in the last 168 hours. D-Dimer No results for input(s): DDIMER in the last 72 hours. Hgb A1c No results for input(s): HGBA1C in the last 72 hours. Lipid Profile No results for  input(s): CHOL, HDL, LDLCALC, TRIG, CHOLHDL, LDLDIRECT in the last 72 hours. Thyroid function studies No results for input(s): TSH, T4TOTAL, T3FREE, THYROIDAB in the last 72 hours.  Invalid input(s): FREET3 Anemia work up No results for input(s): VITAMINB12, FOLATE, FERRITIN, TIBC, IRON, RETICCTPCT in the last 72 hours. Urinalysis    Component Value Date/Time   COLORURINE YELLOW 04/10/2016 1755   APPEARANCEUR TURBID* 04/10/2016 1755   LABSPEC 1.018 04/10/2016 1755   PHURINE 6.5 04/10/2016 1755   GLUCOSEU NEGATIVE 04/10/2016 1755   HGBUR LARGE* 04/10/2016 1755   BILIRUBINUR NEGATIVE 04/10/2016 1755   KETONESUR NEGATIVE 04/10/2016 1755   PROTEINUR 30* 04/10/2016 1755   UROBILINOGEN 0.2 03/28/2015 0649   NITRITE POSITIVE* 04/10/2016 1755   LEUKOCYTESUR LARGE* 04/10/2016 1755   Sepsis Labs Invalid input(s): PROCALCITONIN,  WBC,  LACTICIDVEN Microbiology Recent Results (from the past 240 hour(s))  MRSA PCR Screening     Status: None   Collection Time: 04/10/16 10:33 PM  Result Value Ref Range Status   MRSA by PCR NEGATIVE NEGATIVE Final    Comment:        The GeneXpert MRSA Assay (FDA approved for NASAL specimens only), is one component of a comprehensive MRSA colonization surveillance program. It is not intended to diagnose MRSA infection nor to guide or monitor treatment for MRSA infections.   Urine culture     Status: Abnormal   Collection Time: 04/11/16  8:57 AM  Result Value Ref Range Status   Specimen Description URINE, CATHETERIZED  Final   Special Requests Normal  Final   Culture >=100,000 COLONIES/mL KLEBSIELLA PNEUMONIAE (A)  Final   Report Status 04/13/2016  FINAL  Final   Organism ID, Bacteria KLEBSIELLA PNEUMONIAE (A)  Final      Susceptibility   Klebsiella pneumoniae - MIC*    AMPICILLIN >=32 RESISTANT Resistant     CEFAZOLIN <=4 SENSITIVE Sensitive     CEFTRIAXONE <=1 SENSITIVE Sensitive     CIPROFLOXACIN <=0.25 SENSITIVE Sensitive     GENTAMICIN <=1  SENSITIVE Sensitive     IMIPENEM 0.5 SENSITIVE Sensitive     NITROFURANTOIN 64 INTERMEDIATE Intermediate     TRIMETH/SULFA <=20 SENSITIVE Sensitive     AMPICILLIN/SULBACTAM 4 SENSITIVE Sensitive     PIP/TAZO <=4 SENSITIVE Sensitive     * >=100,000 COLONIES/mL KLEBSIELLA PNEUMONIAE  Gastrointestinal Panel by PCR , Stool     Status: Abnormal   Collection Time: 04/11/16  4:07 PM  Result Value Ref Range Status   Campylobacter species NOT DETECTED NOT DETECTED Final   Plesimonas shigelloides NOT DETECTED NOT DETECTED Final   Salmonella species NOT DETECTED NOT DETECTED Final   Yersinia enterocolitica NOT DETECTED NOT DETECTED Final   Vibrio species NOT DETECTED NOT DETECTED Final   Vibrio cholerae NOT DETECTED NOT DETECTED Final   Enteroaggregative E coli (EAEC) NOT DETECTED NOT DETECTED Final   Enteropathogenic E coli (EPEC) (A) NOT DETECTED Final    CRITICAL RESULT CALLED TO, READ BACK BY AND VERIFIED WITH:    Comment: HILDA MJANG ON 04/12/16 AT 0038 [facility]   Enterotoxigenic E coli (ETEC) NOT DETECTED NOT DETECTED Final   Shiga like toxin producing E coli (STEC) NOT DETECTED NOT DETECTED Final   E. coli O157 NOT DETECTED NOT DETECTED Final   Shigella/Enteroinvasive E coli (EIEC) NOT DETECTED NOT DETECTED Final   Cryptosporidium NOT DETECTED NOT DETECTED Final   Cyclospora cayetanensis NOT DETECTED NOT DETECTED Final   Entamoeba histolytica NOT DETECTED NOT DETECTED Final   Giardia lamblia NOT DETECTED NOT DETECTED Final   Adenovirus F40/41 NOT DETECTED NOT DETECTED Final   Astrovirus NOT DETECTED NOT DETECTED Final   Norovirus GI/GII NOT DETECTED NOT DETECTED Final   Rotavirus A NOT DETECTED NOT DETECTED Final   Sapovirus (I, II, IV, and V) NOT DETECTED NOT DETECTED Final     SIGNED:  Garnell Begeman, Scheryl Marten, MD  [facility] 04/13/2016, 4:16 PM  If 7PM-7AM, please contact night-coverage [email or web address] Password [email or web address]

## 2016-04-13 NOTE — Discharge Summary (Addendum)
Physician Discharge Summary  Loretta Garcia ZOX:[id number] DOB: May 01, 1920 DOA: 04/10/2016  PCP: Londell MohPHARR,WALTER DAVIDSON, MD  Admit date: 04/10/2016 Discharge date: 04/13/2016  Disposition:  SNF  Recommendations for Outpatient Follow-up:  1. Follow up with PCP in 1-2 weeks  Discharge Condition:Stable CODE STATUS:DNR Diet recommendation: Regular  Brief/Interim Summary: 82 y/o with dementia, HTN, TIA with frequent falls presents from SNF for an unwitnessed fall. No fractures on imaging. Had rhabdomyolysis, AKI, leukocytosis. Patient later noted to have loose stools.   Rhabdomyolysis - improved - due to fall and laying on floor for undetermined amount of time  Fever 100.4 / leukocytosis / positive UA - U culture > 100,000 gr neg rods - Continued on Cipro this admission  Diarrhea- enterocolitis-  - checked stool pathogen panel and c diff>> pos for enteropathogenic E coli- cont Cipro - discussed with ID who recommended 5-6 days of ciprofloxacin. Per ID, OK to complete course with PO abx  Pressure ulcer - WOC assisting with management- air mattress  Hypotension with h/o Essential hypertension - held Indapamide and Norvasc   AKI (acute kidney injury) - baseline Cr 0.5-0.6- Cr on admission 1.88- improving - likely prerenal - held Indapamide and cont IVF this admission   Frequent falls  - PT eval, orthostatics negative  - Plan to d/c to SNF  Hypokalemia - replaced, held Indapamide  Anemia -chronic- Hb 8-9 in the past -Stable  Dementia  -Seems stable at this time  Discharge Diagnoses:  Principal Problem:   Rhabdomyolysis Active Problems:   Fall at home   Essential hypertension   AKI (acute kidney injury) (HCC)   Leukocytosis   Frequent falls   Pressure ulcer  Discharge Instructions    Medication List    STOP taking these medications        indapamide 1.25 MG tablet  Commonly known as:  LOZOL      TAKE these medications        acetaminophen 500 MG  tablet  Commonly known as:  TYLENOL  Take 1,000 mg by mouth every 6 (six) hours as needed for mild pain, moderate pain or headache.     ALPRAZolam 0.25 MG tablet  Commonly known as:  XANAX  Take 1 tablet (0.25 mg total) by mouth 3 (three) times daily as needed for anxiety.     amLODipine 2.5 MG tablet  Commonly known as:  NORVASC  Take 1 tablet (2.5 mg total) by mouth daily.     aspirin EC 81 MG tablet  Take 81 mg by mouth daily.     CERTA-VITE PO  Take 1 tablet by mouth daily.     ciprofloxacin 100 MG tablet  Commonly known as:  CIPRO  Take 1 tablet (100 mg total) by mouth 2 (two) times daily.     DSS 100 MG Caps  Take 100 mg by mouth 2 (two) times daily.     pantoprazole 40 MG tablet  Commonly known as:  PROTONIX  Take 1 tablet (40 mg total) by mouth daily.     polyethylene glycol packet  Commonly known as:  MIRALAX / GLYCOLAX  Take 17 g by mouth See admin instructions. EVERY 2 DAYS     Potassium Chloride CR 8 MEQ Cpcr capsule CR  Commonly known as:  MICRO-K  Take 1 capsule by mouth daily.     saccharomyces boulardii 250 MG capsule  Commonly known as:  FLORASTOR  Take 1 capsule (250 mg total) by mouth 2 (two) times daily.  sertraline 25 MG tablet  Commonly known as:  ZOLOFT  Take 25 mg by mouth daily.     vitamin B-12 100 MCG tablet  Commonly known as:  CYANOCOBALAMIN  Take 100 mcg by mouth daily.        Allergies  Allergen Reactions  . Altace [Ramipril]     Per MAR  . Calcitonin     Per MAR  . Cardura [Doxazosin Mesylate]     Per MAR  . Fosamax [Alendronate Sodium]     Per MAR  . Penicillins     Per MAR  . Tramadol     Per MAR  . Codeine Other (See Comments)    unknown  . Darvocet [Propoxyphene N-Acetaminophen] Other (See Comments)    unknown  . Plavix [Clopidogrel Bisulfate] Other (See Comments)    unsure      Procedures/Studies: Dg Chest 2 View  04/10/2016  CLINICAL DATA:  Found on floor.  Leukocytosis EXAM: CHEST  2 VIEW  COMPARISON:  03/29/2015 FINDINGS: Heart size upper normal. Mild vascular congestion without edema or effusion. Negative for pneumonia. Atherosclerotic aorta. Severe compression fracture in the lower thoracic spine at approximately T12 unchanged from CT chest 03/28/2015 IMPRESSION: Mild vascular congestion without edema.  Negative for pneumonia Atherosclerotic aorta Electronically Signed   By: Marlan Palau M.D.   On: 04/10/2016 12:44   Ct Head Wo Contrast  04/10/2016  CLINICAL DATA:  Found lying on floor this morning. Unwitnessed fall. History of dementia. Neck pain. EXAM: CT HEAD WITHOUT CONTRAST CT CERVICAL SPINE WITHOUT CONTRAST TECHNIQUE: Multidetector CT imaging of the head and cervical spine was performed following the standard protocol without intravenous contrast. Multiplanar CT image reconstructions of the cervical spine were also generated. COMPARISON:  03/28/2015. FINDINGS: CT HEAD FINDINGS There is no evidence for acute hemorrhage, hydrocephalus, mass lesion, or abnormal extra-axial fluid collection. No definite CT evidence for acute infarction. Diffuse loss of parenchymal volume is consistent with atrophy. Patchy low attenuation in the deep hemispheric and periventricular white matter is nonspecific, but likely reflects chronic microvascular ischemic demyelination. Lacunar infarcts identified in the cerebellar hemispheres bilaterally. The visualized paranasal sinuses and mastoid air cells are clear. No evidence for skull fracture. CT CERVICAL SPINE FINDINGS Imaging was obtained from the skullbase through the T2 vertebral body. The a no evidence of fracture. Spondylolisthesis at C3-4 is stable. There is advanced degenerative disc disease at C4-5, C5-6, C6-7, as before. Mild bilateral facet arthropathy is more advanced on the left than the right in a diffuse fashion. Straightening of the normal cervical lordosis evident. No prevertebral soft tissue edema IMPRESSION: 1. No acute intracranial  abnormality. 2. Advanced chronic microvascular ischemic disease with bilateral cerebellar infarcts. 3. Atrophy. 4. No evidence for acute cervical spine fracture. 5. Stable spondylolisthesis at C3-4 with stable advanced degenerative disc disease in the mid and lower cervical levels. Electronically Signed   By: Kennith Center M.D.   On: 04/10/2016 10:48   Ct Cervical Spine Wo Contrast  04/10/2016  CLINICAL DATA:  Found lying on floor this morning. Unwitnessed fall. History of dementia. Neck pain. EXAM: CT HEAD WITHOUT CONTRAST CT CERVICAL SPINE WITHOUT CONTRAST TECHNIQUE: Multidetector CT imaging of the head and cervical spine was performed following the standard protocol without intravenous contrast. Multiplanar CT image reconstructions of the cervical spine were also generated. COMPARISON:  03/28/2015. FINDINGS: CT HEAD FINDINGS There is no evidence for acute hemorrhage, hydrocephalus, mass lesion, or abnormal extra-axial fluid collection. No definite CT evidence for acute infarction. Diffuse  loss of parenchymal volume is consistent with atrophy. Patchy low attenuation in the deep hemispheric and periventricular white matter is nonspecific, but likely reflects chronic microvascular ischemic demyelination. Lacunar infarcts identified in the cerebellar hemispheres bilaterally. The visualized paranasal sinuses and mastoid air cells are clear. No evidence for skull fracture. CT CERVICAL SPINE FINDINGS Imaging was obtained from the skullbase through the T2 vertebral body. The a no evidence of fracture. Spondylolisthesis at C3-4 is stable. There is advanced degenerative disc disease at C4-5, C5-6, C6-7, as before. Mild bilateral facet arthropathy is more advanced on the left than the right in a diffuse fashion. Straightening of the normal cervical lordosis evident. No prevertebral soft tissue edema IMPRESSION: 1. No acute intracranial abnormality. 2. Advanced chronic microvascular ischemic disease with bilateral  cerebellar infarcts. 3. Atrophy. 4. No evidence for acute cervical spine fracture. 5. Stable spondylolisthesis at C3-4 with stable advanced degenerative disc disease in the mid and lower cervical levels. Electronically Signed   By: Kennith Center M.D.   On: 04/10/2016 10:48   Ct Lumbar Spine Wo Contrast  04/10/2016  CLINICAL DATA:  Found on floor.  Back pain EXAM: CT LUMBAR SPINE WITHOUT CONTRAST TECHNIQUE: Multidetector CT imaging of the lumbar spine was performed without intravenous contrast administration. Multiplanar CT image reconstructions were also generated. COMPARISON:  Lumbar MRI 05/19/2006.  X-ray 05/03/2010 FINDINGS: Moderate levoscoliosis as noted on prior studies. Chronic compression fracture T11 unchanged. No acute fracture identified. Negative for mass lesion Atherosclerotic aorta and iliac arteries without aneurysm. No retroperitoneal mass T12-L1:  Mild disc bulging without stenosis L1-2: Advanced disc degeneration right greater than left. No significant spinal stenosis L2-3: Disc degeneration and spurring. Mild right foraminal narrowing due to spurring. L3-4: Disc degeneration and spondylosis. Left foraminal narrowing due to spurring. No significant spinal stenosis L4-5: Disc degeneration and spurring, left greater than right. Mild left foraminal narrowing. Spinal canal adequate L5-S1: Negative IMPRESSION: Negative for fracture Moderate scoliosis.  Multilevel degenerative change as above Atherosclerotic aorta without aneurysm. Electronically Signed   By: Marlan Palau M.D.   On: 04/10/2016 10:53   Dg Hip Unilat With Pelvis 2-3 Views Right  04/10/2016  CLINICAL DATA:  Possible Fell last night per EMT note; pt denise right hip pain, pt states she had 6 hip surgeries between both hips EXAM: DG HIP (WITH OR WITHOUT PELVIS) 2-3V RIGHT COMPARISON:  CT 03/28/2015 FINDINGS: Bilateral hip arthroplasty hardware without evident fracture or dislocation. Distal aspect of both femoral stems not visualized  however. Some bone cement protrudes from the right acetabular component into the true pelvis as before. Cerclage wires around the proximal right femoral shaft, incompletely visualized distally. Bony pelvis intact. Aortoiliac arterial calcifications without suggestion of aneurysm. Spondylitic changes in the lower lumbar spine. Bilateral pelvic phleboliths. IMPRESSION: 1. Bilateral hip arthroplasty hardware without dislocation. 2. No acute fracture. 3. Lower lumbar spondylitic changes. 4.  Aortic Atherosclerosis (ICD10-170.0) Electronically Signed   By: Corlis Leak M.D.   On: 04/10/2016 10:29     Subjective:  Complains of rectal pain while moving bowels this AM  Discharge Exam: Filed Vitals:   04/12/16 2017 04/13/16 0600  BP: 121/55 138/58  Pulse: 74 70  Temp: 98.5 F (36.9 C) 98.5 F (36.9 C)  Resp: 18 18   Filed Vitals:   04/12/16 0432 04/12/16 1501 04/12/16 2017 04/13/16 0600  BP: 112/63 110/70 121/55 138/58  Pulse: 61 65 74 70  Temp: 98.6 F (37 C) 98.8 F (37.1 C) 98.5 F (36.9 C) 98.5 F (36.9  C)  TempSrc: Oral Oral Oral Oral  Resp: Height:      Weight: 51.529 kg (113 lb 9.6 oz)     SpO2: 96% 96% 95% 94%    General: Pt is alert, awake, not in acute distress Cardiovascular: RRR, S1/S2 +, no rubs, no gallops Respiratory: CTA bilaterally, no wheezing, no rhonchi Abdominal: Soft, NT, ND, bowel sounds + Extremities: no edema, no cyanosis    The results of significant diagnostics from this hospitalization (including imaging, microbiology, ancillary and laboratory) are listed below for reference.     Microbiology: Recent Results (from the past 240 hour(s))  MRSA PCR Screening     Status: None   Collection Time: 04/10/16 10:33 PM  Result Value Ref Range Status   MRSA by PCR NEGATIVE NEGATIVE Final    Comment:        The GeneXpert MRSA Assay (FDA approved for NASAL specimens only), is one component of a comprehensive MRSA colonization surveillance  program. It is not intended to diagnose MRSA infection nor to guide or monitor treatment for MRSA infections.   Urine culture     Status: Abnormal   Collection Time: 04/11/16  8:57 AM  Result Value Ref Range Status   Specimen Description URINE, CATHETERIZED  Final   Special Requests Normal  Final   Culture >=100,000 COLONIES/mL KLEBSIELLA PNEUMONIAE (A)  Final   Report Status 04/13/2016 FINAL  Final   Organism ID, Bacteria KLEBSIELLA PNEUMONIAE (A)  Final      Susceptibility   Klebsiella pneumoniae - MIC*    AMPICILLIN >=32 RESISTANT Resistant     CEFAZOLIN <=4 SENSITIVE Sensitive     CEFTRIAXONE <=1 SENSITIVE Sensitive     CIPROFLOXACIN <=0.25 SENSITIVE Sensitive     GENTAMICIN <=1 SENSITIVE Sensitive     IMIPENEM 0.5 SENSITIVE Sensitive     NITROFURANTOIN 64 INTERMEDIATE Intermediate     TRIMETH/SULFA <=20 SENSITIVE Sensitive     AMPICILLIN/SULBACTAM 4 SENSITIVE Sensitive     PIP/TAZO <=4 SENSITIVE Sensitive     * >=100,000 COLONIES/mL KLEBSIELLA PNEUMONIAE  Gastrointestinal Panel by PCR , Stool     Status: Abnormal   Collection Time: 04/11/16  4:07 PM  Result Value Ref Range Status   Campylobacter species NOT DETECTED NOT DETECTED Final   Plesimonas shigelloides NOT DETECTED NOT DETECTED Final   Salmonella species NOT DETECTED NOT DETECTED Final   Yersinia enterocolitica NOT DETECTED NOT DETECTED Final   Vibrio species NOT DETECTED NOT DETECTED Final   Vibrio cholerae NOT DETECTED NOT DETECTED Final   Enteroaggregative E coli (EAEC) NOT DETECTED NOT DETECTED Final   Enteropathogenic E coli (EPEC) (A) NOT DETECTED Final    CRITICAL RESULT CALLED TO, READ BACK BY AND VERIFIED WITH:    Comment: HILDA MJANG ON 04/12/16 AT 0038 [facility]   Enterotoxigenic E coli (ETEC) NOT DETECTED NOT DETECTED Final   Shiga like toxin producing E coli (STEC) NOT DETECTED NOT DETECTED Final   E. coli O157 NOT DETECTED NOT DETECTED Final   Shigella/Enteroinvasive E coli (EIEC) NOT DETECTED NOT  DETECTED Final   Cryptosporidium NOT DETECTED NOT DETECTED Final   Cyclospora cayetanensis NOT DETECTED NOT DETECTED Final   Entamoeba histolytica NOT DETECTED NOT DETECTED Final   Giardia lamblia NOT DETECTED NOT DETECTED Final   Adenovirus F40/41 NOT DETECTED NOT DETECTED Final   Astrovirus NOT DETECTED NOT DETECTED Final   Norovirus GI/GII NOT DETECTED NOT DETECTED Final   Rotavirus A NOT DETECTED NOT DETECTED Final  Sapovirus (I, II, IV, and V) NOT DETECTED NOT DETECTED Final     Labs: BNP (last 3 results) No results for input(s): BNP in the last 8760 hours. Basic Metabolic Panel:  Recent Labs Lab 04/10/16 1051 04/10/16 1058 04/11/16 0454 04/12/16 0448  NA  --  139 140 138  K  --  4.0 2.9* 4.0  CL  --  99* 106 107  CO2  --   --  28 26  GLUCOSE  --  142* 107* 113*  BUN  --  42* 28* 22*  CREATININE 1.88* 1.80* 0.90 0.55  CALCIUM  --   --  8.3* 7.8*  MG  --   --  1.7  --    Liver Function Tests: No results for input(s): AST, ALT, ALKPHOS, BILITOT, PROT, ALBUMIN in the last 168 hours. No results for input(s): LIPASE, AMYLASE in the last 168 hours. No results for input(s): AMMONIA in the last 168 hours. CBC:  Recent Labs Lab 04/10/16 1051 04/10/16 1058 04/11/16 0454 04/12/16 0448  WBC 19.4*  --  13.1* 9.8  NEUTROABS 16.9*  --   --   --   HGB 14.1 14.3 11.3* 10.3*  HCT 39.8 42.0 32.8* 31.0*  MCV 88.1  --  91.1 92.3  PLT 254  --  182 175   Cardiac Enzymes:  Recent Labs Lab 04/10/16 1051 04/11/16 0454 04/12/16 0448  CKTOTAL 1271* 791* 402*   BNP: Invalid input(s): POCBNP CBG: No results for input(s): GLUCAP in the last 168 hours. D-Dimer No results for input(s): DDIMER in the last 72 hours. Hgb A1c No results for input(s): HGBA1C in the last 72 hours. Lipid Profile No results for input(s): CHOL, HDL, LDLCALC, TRIG, CHOLHDL, LDLDIRECT in the last 72 hours. Thyroid function studies No results for input(s): TSH, T4TOTAL, T3FREE, THYROIDAB in the last  72 hours.  Invalid input(s): FREET3 Anemia work up No results for input(s): VITAMINB12, FOLATE, FERRITIN, TIBC, IRON, RETICCTPCT in the last 72 hours. Urinalysis    Component Value Date/Time   COLORURINE YELLOW 04/10/2016 1755   APPEARANCEUR TURBID* 04/10/2016 1755   LABSPEC 1.018 04/10/2016 1755   PHURINE 6.5 04/10/2016 1755   GLUCOSEU NEGATIVE 04/10/2016 1755   HGBUR LARGE* 04/10/2016 1755   BILIRUBINUR NEGATIVE 04/10/2016 1755   KETONESUR NEGATIVE 04/10/2016 1755   PROTEINUR 30* 04/10/2016 1755   UROBILINOGEN 0.2 03/28/2015 0649   NITRITE POSITIVE* 04/10/2016 1755   LEUKOCYTESUR LARGE* 04/10/2016 1755   Sepsis Labs Invalid input(s): PROCALCITONIN,  WBC,  LACTICIDVEN Microbiology Recent Results (from the past 240 hour(s))  MRSA PCR Screening     Status: None   Collection Time: 04/10/16 10:33 PM  Result Value Ref Range Status   MRSA by PCR NEGATIVE NEGATIVE Final    Comment:        The GeneXpert MRSA Assay (FDA approved for NASAL specimens only), is one component of a comprehensive MRSA colonization surveillance program. It is not intended to diagnose MRSA infection nor to guide or monitor treatment for MRSA infections.   Urine culture     Status: Abnormal   Collection Time: 04/11/16  8:57 AM  Result Value Ref Range Status   Specimen Description URINE, CATHETERIZED  Final   Special Requests Normal  Final   Culture >=100,000 COLONIES/mL KLEBSIELLA PNEUMONIAE (A)  Final   Report Status 04/13/2016 FINAL  Final   Organism ID, Bacteria KLEBSIELLA PNEUMONIAE (A)  Final      Susceptibility   Klebsiella pneumoniae - MIC*    AMPICILLIN >=  32 RESISTANT Resistant     CEFAZOLIN <=4 SENSITIVE Sensitive     CEFTRIAXONE <=1 SENSITIVE Sensitive     CIPROFLOXACIN <=0.25 SENSITIVE Sensitive     GENTAMICIN <=1 SENSITIVE Sensitive     IMIPENEM 0.5 SENSITIVE Sensitive     NITROFURANTOIN 64 INTERMEDIATE Intermediate     TRIMETH/SULFA <=20 SENSITIVE Sensitive      AMPICILLIN/SULBACTAM 4 SENSITIVE Sensitive     PIP/TAZO <=4 SENSITIVE Sensitive     * >=100,000 COLONIES/mL KLEBSIELLA PNEUMONIAE  Gastrointestinal Panel by PCR , Stool     Status: Abnormal   Collection Time: 04/11/16  4:07 PM  Result Value Ref Range Status   Campylobacter species NOT DETECTED NOT DETECTED Final   Plesimonas shigelloides NOT DETECTED NOT DETECTED Final   Salmonella species NOT DETECTED NOT DETECTED Final   Yersinia enterocolitica NOT DETECTED NOT DETECTED Final   Vibrio species NOT DETECTED NOT DETECTED Final   Vibrio cholerae NOT DETECTED NOT DETECTED Final   Enteroaggregative E coli (EAEC) NOT DETECTED NOT DETECTED Final   Enteropathogenic E coli (EPEC) (A) NOT DETECTED Final    CRITICAL RESULT CALLED TO, READ BACK BY AND VERIFIED WITH:    Comment: HILDA MJANG ON 04/12/16 AT 0038 [facility]   Enterotoxigenic E coli (ETEC) NOT DETECTED NOT DETECTED Final   Shiga like toxin producing E coli (STEC) NOT DETECTED NOT DETECTED Final   E. coli O157 NOT DETECTED NOT DETECTED Final   Shigella/Enteroinvasive E coli (EIEC) NOT DETECTED NOT DETECTED Final   Cryptosporidium NOT DETECTED NOT DETECTED Final   Cyclospora cayetanensis NOT DETECTED NOT DETECTED Final   Entamoeba histolytica NOT DETECTED NOT DETECTED Final   Giardia lamblia NOT DETECTED NOT DETECTED Final   Adenovirus F40/41 NOT DETECTED NOT DETECTED Final   Astrovirus NOT DETECTED NOT DETECTED Final   Norovirus GI/GII NOT DETECTED NOT DETECTED Final   Rotavirus A NOT DETECTED NOT DETECTED Final   Sapovirus (I, II, IV, and V) NOT DETECTED NOT DETECTED Final     SIGNED:  Tommie Bohlken, Scheryl Marten, MD  [facility] 04/13/2016, 2:29 PM  If 7PM-7AM, please contact night-coverage [email or web address] Password [email or web address]

## 2016-04-14 DIAGNOSIS — L89300 Pressure ulcer of unspecified buttock, unstageable: Secondary | ICD-10-CM | POA: Diagnosis not present

## 2016-04-14 DIAGNOSIS — F0150 Vascular dementia without behavioral disturbance: Secondary | ICD-10-CM | POA: Diagnosis not present

## 2016-04-14 DIAGNOSIS — M6282 Rhabdomyolysis: Secondary | ICD-10-CM | POA: Diagnosis not present

## 2016-04-14 DIAGNOSIS — N19 Unspecified kidney failure: Secondary | ICD-10-CM | POA: Diagnosis not present

## 2016-04-14 DIAGNOSIS — R296 Repeated falls: Secondary | ICD-10-CM | POA: Diagnosis not present

## 2016-05-04 DIAGNOSIS — L89309 Pressure ulcer of unspecified buttock, unspecified stage: Secondary | ICD-10-CM | POA: Diagnosis not present

## 2016-05-16 DIAGNOSIS — D649 Anemia, unspecified: Secondary | ICD-10-CM | POA: Diagnosis not present

## 2016-05-16 DIAGNOSIS — I10 Essential (primary) hypertension: Secondary | ICD-10-CM | POA: Diagnosis not present

## 2016-05-16 DIAGNOSIS — F325 Major depressive disorder, single episode, in full remission: Secondary | ICD-10-CM | POA: Diagnosis not present

## 2016-05-27 DIAGNOSIS — R634 Abnormal weight loss: Secondary | ICD-10-CM | POA: Diagnosis not present

## 2016-05-27 DIAGNOSIS — F329 Major depressive disorder, single episode, unspecified: Secondary | ICD-10-CM | POA: Diagnosis not present

## 2016-05-27 DIAGNOSIS — K219 Gastro-esophageal reflux disease without esophagitis: Secondary | ICD-10-CM | POA: Diagnosis not present

## 2016-05-27 DIAGNOSIS — I10 Essential (primary) hypertension: Secondary | ICD-10-CM | POA: Diagnosis not present

## 2016-05-27 DIAGNOSIS — K5900 Constipation, unspecified: Secondary | ICD-10-CM | POA: Diagnosis not present

## 2016-05-27 DIAGNOSIS — E538 Deficiency of other specified B group vitamins: Secondary | ICD-10-CM | POA: Diagnosis not present

## 2016-05-27 DIAGNOSIS — Z9181 History of falling: Secondary | ICD-10-CM | POA: Diagnosis not present

## 2016-05-27 DIAGNOSIS — R410 Disorientation, unspecified: Secondary | ICD-10-CM | POA: Diagnosis not present

## 2016-05-30 DIAGNOSIS — E559 Vitamin D deficiency, unspecified: Secondary | ICD-10-CM | POA: Diagnosis not present

## 2016-05-30 DIAGNOSIS — Z9181 History of falling: Secondary | ICD-10-CM | POA: Diagnosis not present

## 2016-05-30 DIAGNOSIS — I10 Essential (primary) hypertension: Secondary | ICD-10-CM | POA: Diagnosis not present

## 2016-05-30 DIAGNOSIS — G8911 Acute pain due to trauma: Secondary | ICD-10-CM | POA: Diagnosis not present

## 2016-05-30 DIAGNOSIS — K219 Gastro-esophageal reflux disease without esophagitis: Secondary | ICD-10-CM | POA: Diagnosis not present

## 2016-05-30 DIAGNOSIS — F339 Major depressive disorder, recurrent, unspecified: Secondary | ICD-10-CM | POA: Diagnosis not present

## 2016-05-30 DIAGNOSIS — K5900 Constipation, unspecified: Secondary | ICD-10-CM | POA: Diagnosis not present

## 2016-05-30 DIAGNOSIS — R7989 Other specified abnormal findings of blood chemistry: Secondary | ICD-10-CM | POA: Diagnosis not present

## 2016-05-30 DIAGNOSIS — Z87440 Personal history of urinary (tract) infections: Secondary | ICD-10-CM | POA: Diagnosis not present

## 2016-05-30 DIAGNOSIS — R410 Disorientation, unspecified: Secondary | ICD-10-CM | POA: Diagnosis not present

## 2016-05-30 DIAGNOSIS — M6281 Muscle weakness (generalized): Secondary | ICD-10-CM | POA: Diagnosis not present

## 2016-05-30 DIAGNOSIS — F329 Major depressive disorder, single episode, unspecified: Secondary | ICD-10-CM | POA: Diagnosis not present

## 2016-05-30 DIAGNOSIS — R634 Abnormal weight loss: Secondary | ICD-10-CM | POA: Diagnosis not present

## 2016-05-30 DIAGNOSIS — F419 Anxiety disorder, unspecified: Secondary | ICD-10-CM | POA: Diagnosis not present

## 2016-06-01 DIAGNOSIS — K5900 Constipation, unspecified: Secondary | ICD-10-CM | POA: Diagnosis not present

## 2016-06-01 DIAGNOSIS — G8911 Acute pain due to trauma: Secondary | ICD-10-CM | POA: Diagnosis not present

## 2016-06-01 DIAGNOSIS — I10 Essential (primary) hypertension: Secondary | ICD-10-CM | POA: Diagnosis not present

## 2016-06-01 DIAGNOSIS — M6281 Muscle weakness (generalized): Secondary | ICD-10-CM | POA: Diagnosis not present

## 2016-06-01 DIAGNOSIS — H33001 Unspecified retinal detachment with retinal break, right eye: Secondary | ICD-10-CM | POA: Diagnosis not present

## 2016-06-01 DIAGNOSIS — R8299 Other abnormal findings in urine: Secondary | ICD-10-CM | POA: Diagnosis not present

## 2016-06-02 DIAGNOSIS — M6281 Muscle weakness (generalized): Secondary | ICD-10-CM | POA: Diagnosis not present

## 2016-06-02 DIAGNOSIS — I10 Essential (primary) hypertension: Secondary | ICD-10-CM | POA: Diagnosis not present

## 2016-06-07 DIAGNOSIS — I10 Essential (primary) hypertension: Secondary | ICD-10-CM | POA: Diagnosis not present

## 2016-06-07 DIAGNOSIS — M6281 Muscle weakness (generalized): Secondary | ICD-10-CM | POA: Diagnosis not present

## 2016-06-07 DIAGNOSIS — M79605 Pain in left leg: Secondary | ICD-10-CM | POA: Diagnosis not present

## 2016-06-09 DIAGNOSIS — I10 Essential (primary) hypertension: Secondary | ICD-10-CM | POA: Diagnosis not present

## 2016-06-09 DIAGNOSIS — M6281 Muscle weakness (generalized): Secondary | ICD-10-CM | POA: Diagnosis not present

## 2016-06-14 DIAGNOSIS — M6281 Muscle weakness (generalized): Secondary | ICD-10-CM | POA: Diagnosis not present

## 2016-06-14 DIAGNOSIS — I10 Essential (primary) hypertension: Secondary | ICD-10-CM | POA: Diagnosis not present

## 2016-06-16 DIAGNOSIS — M6281 Muscle weakness (generalized): Secondary | ICD-10-CM | POA: Diagnosis not present

## 2016-06-16 DIAGNOSIS — I10 Essential (primary) hypertension: Secondary | ICD-10-CM | POA: Diagnosis not present

## 2016-06-22 DIAGNOSIS — M6281 Muscle weakness (generalized): Secondary | ICD-10-CM | POA: Diagnosis not present

## 2016-06-22 DIAGNOSIS — I10 Essential (primary) hypertension: Secondary | ICD-10-CM | POA: Diagnosis not present

## 2016-06-23 DIAGNOSIS — I10 Essential (primary) hypertension: Secondary | ICD-10-CM | POA: Diagnosis not present

## 2016-06-23 DIAGNOSIS — M6281 Muscle weakness (generalized): Secondary | ICD-10-CM | POA: Diagnosis not present

## 2016-10-21 DIAGNOSIS — R300 Dysuria: Secondary | ICD-10-CM | POA: Diagnosis not present

## 2016-10-24 DIAGNOSIS — R300 Dysuria: Secondary | ICD-10-CM | POA: Diagnosis not present

## 2017-06-19 DIAGNOSIS — R410 Disorientation, unspecified: Secondary | ICD-10-CM | POA: Diagnosis not present

## 2017-06-19 DIAGNOSIS — N390 Urinary tract infection, site not specified: Secondary | ICD-10-CM | POA: Diagnosis not present

## 2017-08-30 DIAGNOSIS — R0689 Other abnormalities of breathing: Secondary | ICD-10-CM | POA: Diagnosis not present

## 2017-09-04 DIAGNOSIS — I10 Essential (primary) hypertension: Secondary | ICD-10-CM | POA: Diagnosis not present

## 2017-09-04 DIAGNOSIS — D649 Anemia, unspecified: Secondary | ICD-10-CM | POA: Diagnosis not present

## 2017-09-07 DIAGNOSIS — I10 Essential (primary) hypertension: Secondary | ICD-10-CM | POA: Diagnosis not present

## 2017-09-07 DIAGNOSIS — D649 Anemia, unspecified: Secondary | ICD-10-CM | POA: Diagnosis not present

## 2017-09-07 DIAGNOSIS — E559 Vitamin D deficiency, unspecified: Secondary | ICD-10-CM | POA: Diagnosis not present

## 2017-10-05 DIAGNOSIS — I10 Essential (primary) hypertension: Secondary | ICD-10-CM | POA: Diagnosis not present

## 2017-10-14 DIAGNOSIS — R82998 Other abnormal findings in urine: Secondary | ICD-10-CM | POA: Diagnosis not present

## 2017-11-29 DIAGNOSIS — N390 Urinary tract infection, site not specified: Secondary | ICD-10-CM | POA: Diagnosis not present

## 2017-11-29 DIAGNOSIS — I10 Essential (primary) hypertension: Secondary | ICD-10-CM | POA: Diagnosis not present

## 2018-04-18 DIAGNOSIS — E538 Deficiency of other specified B group vitamins: Secondary | ICD-10-CM | POA: Diagnosis not present

## 2018-04-18 DIAGNOSIS — F0280 Dementia in other diseases classified elsewhere without behavioral disturbance: Secondary | ICD-10-CM | POA: Diagnosis not present

## 2018-09-09 DEATH — deceased
